# Patient Record
Sex: Male | Born: 1940
Health system: Southern US, Community
[De-identification: ages and names within clinical notes are randomized; demographics above are authoritative.]

## PROBLEM LIST (undated history)

## (undated) DIAGNOSIS — M1990 Unspecified osteoarthritis, unspecified site: Secondary | ICD-10-CM

## (undated) DIAGNOSIS — I4891 Unspecified atrial fibrillation: Secondary | ICD-10-CM

## (undated) DIAGNOSIS — I10 Essential (primary) hypertension: Secondary | ICD-10-CM

## (undated) DIAGNOSIS — R7309 Other abnormal glucose: Secondary | ICD-10-CM

## (undated) DIAGNOSIS — E781 Pure hyperglyceridemia: Secondary | ICD-10-CM

## (undated) DIAGNOSIS — S060X9A Concussion with loss of consciousness of unspecified duration, initial encounter: Secondary | ICD-10-CM

## (undated) DIAGNOSIS — H3560 Retinal hemorrhage, unspecified eye: Secondary | ICD-10-CM

## (undated) DIAGNOSIS — I2510 Atherosclerotic heart disease of native coronary artery without angina pectoris: Secondary | ICD-10-CM

## (undated) DIAGNOSIS — G4733 Obstructive sleep apnea (adult) (pediatric): Secondary | ICD-10-CM

## (undated) DIAGNOSIS — R42 Dizziness and giddiness: Secondary | ICD-10-CM

## (undated) DIAGNOSIS — S060XAA Concussion with loss of consciousness status unknown, initial encounter: Secondary | ICD-10-CM

## (undated) DIAGNOSIS — R4020 Unspecified coma: Secondary | ICD-10-CM

## (undated) DIAGNOSIS — E291 Testicular hypofunction: Secondary | ICD-10-CM

## (undated) HISTORY — DX: Concussion with loss of consciousness of unspecified duration, initial encounter: S06.0X9A

## (undated) HISTORY — DX: Other abnormal glucose: R73.09

## (undated) HISTORY — DX: Retinal hemorrhage, unspecified eye: H35.60

## (undated) HISTORY — DX: Atherosclerotic heart disease of native coronary artery without angina pectoris: I25.10

## (undated) HISTORY — DX: Testicular hypofunction: E29.1

## (undated) HISTORY — DX: Unspecified osteoarthritis, unspecified site: M19.90

## (undated) HISTORY — DX: Unspecified coma: R40.20

## (undated) HISTORY — PX: NASAL SINUS SURGERY: SHX719

## (undated) HISTORY — DX: Obstructive sleep apnea (adult) (pediatric): G47.33

## (undated) HISTORY — DX: Concussion with loss of consciousness status unknown, initial encounter: S06.0XAA

## (undated) HISTORY — DX: Essential (primary) hypertension: I10

## (undated) HISTORY — DX: Pure hyperglyceridemia: E78.1

## (undated) HISTORY — PX: FACIAL FRACTURE SURGERY: SHX1570

## (undated) HISTORY — DX: Dizziness and giddiness: R42

---

## 2001-04-09 ENCOUNTER — Encounter: Payer: Self-pay | Admitting: Internal Medicine

## 2003-05-16 ENCOUNTER — Ambulatory Visit (HOSPITAL_COMMUNITY): Admission: RE | Admit: 2003-05-16 | Discharge: 2003-05-16 | Payer: Self-pay | Admitting: Internal Medicine

## 2003-05-16 ENCOUNTER — Encounter: Payer: Self-pay | Admitting: Internal Medicine

## 2004-06-26 ENCOUNTER — Ambulatory Visit: Admission: RE | Admit: 2004-06-26 | Discharge: 2004-06-26 | Payer: Self-pay | Admitting: Otolaryngology

## 2005-04-11 ENCOUNTER — Ambulatory Visit: Payer: Self-pay | Admitting: Internal Medicine

## 2005-04-26 ENCOUNTER — Ambulatory Visit: Payer: Self-pay | Admitting: Internal Medicine

## 2005-05-19 ENCOUNTER — Encounter: Payer: Self-pay | Admitting: Internal Medicine

## 2005-10-03 ENCOUNTER — Encounter: Admission: RE | Admit: 2005-10-03 | Discharge: 2005-10-03 | Payer: Self-pay | Admitting: Internal Medicine

## 2005-10-03 ENCOUNTER — Ambulatory Visit: Payer: Self-pay | Admitting: Internal Medicine

## 2006-03-27 ENCOUNTER — Ambulatory Visit: Payer: Self-pay | Admitting: Internal Medicine

## 2006-03-27 ENCOUNTER — Encounter: Admission: RE | Admit: 2006-03-27 | Discharge: 2006-03-27 | Payer: Self-pay | Admitting: Internal Medicine

## 2006-05-17 ENCOUNTER — Ambulatory Visit: Payer: Self-pay | Admitting: Internal Medicine

## 2006-07-23 ENCOUNTER — Ambulatory Visit: Payer: Self-pay | Admitting: Internal Medicine

## 2006-10-23 ENCOUNTER — Ambulatory Visit: Payer: Self-pay | Admitting: Internal Medicine

## 2006-11-12 ENCOUNTER — Ambulatory Visit: Payer: Self-pay | Admitting: Internal Medicine

## 2006-12-19 ENCOUNTER — Ambulatory Visit: Payer: Self-pay | Admitting: Internal Medicine

## 2006-12-19 LAB — CONVERTED CEMR LAB
ALT: 23 units/L (ref 0–40)
AST: 19 units/L (ref 0–37)
BUN: 17 mg/dL (ref 6–23)
Basophils Absolute: 0 10*3/uL (ref 0.0–0.1)
Basophils Relative: 0.5 % (ref 0.0–1.0)
CO2: 29 meq/L (ref 19–32)
Calcium: 9.7 mg/dL (ref 8.4–10.5)
Chloride: 106 meq/L (ref 96–112)
Cholesterol: 180 mg/dL (ref 0–200)
Creatinine, Ser: 1 mg/dL (ref 0.4–1.5)
Eosinophils Absolute: 0.3 10*3/uL (ref 0.0–0.6)
Eosinophils Relative: 4.6 % (ref 0.0–5.0)
GFR calc Af Amer: 96 mL/min
GFR calc non Af Amer: 79 mL/min
Glucose, Bld: 106 mg/dL — ABNORMAL HIGH (ref 70–99)
HCT: 42.5 % (ref 39.0–52.0)
HDL: 40.7 mg/dL (ref >39.0)
Hemoglobin: 14.7 g/dL (ref 13.0–17.0)
LDL Cholesterol: 110 mg/dL — ABNORMAL HIGH (ref 0–99)
Lymphocytes Relative: 32.7 % (ref 12.0–46.0)
MCHC: 34.6 g/dL (ref 30.0–36.0)
MCV: 84.7 fL (ref 78.0–100.0)
Monocytes Absolute: 0.6 10*3/uL (ref 0.2–0.7)
Monocytes Relative: 10.3 % (ref 3.0–11.0)
Neutro Abs: 2.8 10*3/uL (ref 1.4–7.7)
Neutrophils Relative %: 51.9 % (ref 43.0–77.0)
PSA: 0.94 ng/mL (ref 0.10–4.00)
Platelets: 224 10*3/uL (ref 150–400)
Potassium: 4.1 meq/L (ref 3.5–5.1)
RBC: 5.02 M/uL (ref 4.22–5.81)
RDW: 12.2 % (ref 11.5–14.6)
Sodium: 141 meq/L (ref 135–145)
TSH: 1.62 microintl units/mL (ref 0.35–5.50)
Total CHOL/HDL Ratio: 4.4
Triglycerides: 146 mg/dL (ref 0–149)
VLDL: 29 mg/dL (ref 0–40)
WBC: 5.5 10*3/uL (ref 4.5–10.5)

## 2006-12-21 ENCOUNTER — Ambulatory Visit: Payer: Self-pay | Admitting: Internal Medicine

## 2007-03-26 ENCOUNTER — Ambulatory Visit: Payer: Self-pay | Admitting: Internal Medicine

## 2007-04-15 ENCOUNTER — Ambulatory Visit: Payer: Self-pay | Admitting: Internal Medicine

## 2007-08-06 ENCOUNTER — Telehealth: Payer: Self-pay | Admitting: Internal Medicine

## 2007-08-14 DIAGNOSIS — I10 Essential (primary) hypertension: Secondary | ICD-10-CM

## 2007-08-14 DIAGNOSIS — M1990 Unspecified osteoarthritis, unspecified site: Secondary | ICD-10-CM | POA: Insufficient documentation

## 2007-08-14 DIAGNOSIS — R42 Dizziness and giddiness: Secondary | ICD-10-CM

## 2007-08-14 HISTORY — DX: Unspecified osteoarthritis, unspecified site: M19.90

## 2007-08-14 HISTORY — DX: Essential (primary) hypertension: I10

## 2007-08-19 ENCOUNTER — Ambulatory Visit: Payer: Self-pay | Admitting: Internal Medicine

## 2007-08-19 DIAGNOSIS — R7309 Other abnormal glucose: Secondary | ICD-10-CM

## 2007-08-19 HISTORY — DX: Other abnormal glucose: R73.09

## 2007-10-07 ENCOUNTER — Telehealth: Payer: Self-pay | Admitting: Internal Medicine

## 2007-10-25 ENCOUNTER — Ambulatory Visit: Payer: Self-pay | Admitting: Internal Medicine

## 2007-10-25 LAB — CONVERTED CEMR LAB
Bilirubin Urine: NEGATIVE
Blood in Urine, dipstick: NEGATIVE
Glucose, Urine, Semiquant: NEGATIVE
Ketones, urine, test strip: NEGATIVE
Nitrite: NEGATIVE
Protein, U semiquant: NEGATIVE
Specific Gravity, Urine: 1.01
Urobilinogen, UA: 0.2
WBC Urine, dipstick: NEGATIVE
pH: 7

## 2007-10-27 LAB — CONVERTED CEMR LAB
BUN: 11 mg/dL (ref 6–23)
CO2: 29 meq/L (ref 19–32)
Calcium: 10.1 mg/dL (ref 8.4–10.5)
Chloride: 104 meq/L (ref 96–112)
Creatinine, Ser: 1 mg/dL (ref 0.4–1.5)
GFR calc Af Amer: 96 mL/min
GFR calc non Af Amer: 79 mL/min
Glucose, Bld: 108 mg/dL — ABNORMAL HIGH (ref 70–99)
Hgb A1c MFr Bld: 5.7 % (ref 4.6–6.0)
Potassium: 5 meq/L (ref 3.5–5.1)
Sodium: 141 meq/L (ref 135–145)

## 2007-11-04 ENCOUNTER — Encounter: Payer: Self-pay | Admitting: Internal Medicine

## 2007-11-07 ENCOUNTER — Ambulatory Visit: Payer: Self-pay | Admitting: Internal Medicine

## 2007-11-07 DIAGNOSIS — J069 Acute upper respiratory infection, unspecified: Secondary | ICD-10-CM | POA: Insufficient documentation

## 2007-11-12 ENCOUNTER — Ambulatory Visit: Payer: Self-pay | Admitting: Internal Medicine

## 2007-11-21 ENCOUNTER — Ambulatory Visit: Payer: Self-pay | Admitting: Internal Medicine

## 2007-11-21 DIAGNOSIS — M549 Dorsalgia, unspecified: Secondary | ICD-10-CM | POA: Insufficient documentation

## 2007-11-21 LAB — CONVERTED CEMR LAB
Bilirubin Urine: NEGATIVE
Blood in Urine, dipstick: NEGATIVE
Glucose, Urine, Semiquant: NEGATIVE
Ketones, urine, test strip: NEGATIVE
Nitrite: NEGATIVE
Protein, U semiquant: NEGATIVE
Specific Gravity, Urine: 1.01
Urobilinogen, UA: 0.2
WBC Urine, dipstick: NEGATIVE
pH: 7

## 2008-01-10 ENCOUNTER — Emergency Department (HOSPITAL_COMMUNITY): Admission: EM | Admit: 2008-01-10 | Discharge: 2008-01-11 | Payer: Self-pay | Admitting: Emergency Medicine

## 2008-01-23 ENCOUNTER — Ambulatory Visit (HOSPITAL_COMMUNITY): Admission: RE | Admit: 2008-01-23 | Discharge: 2008-01-23 | Payer: Self-pay | Admitting: Internal Medicine

## 2008-11-16 ENCOUNTER — Ambulatory Visit: Payer: Self-pay | Admitting: Internal Medicine

## 2008-11-16 DIAGNOSIS — G4733 Obstructive sleep apnea (adult) (pediatric): Secondary | ICD-10-CM

## 2008-11-16 HISTORY — DX: Obstructive sleep apnea (adult) (pediatric): G47.33

## 2008-11-17 ENCOUNTER — Encounter: Payer: Self-pay | Admitting: Internal Medicine

## 2008-11-30 LAB — CONVERTED CEMR LAB
Calcium, Total (PTH): 10.6 mg/dL — ABNORMAL HIGH (ref 8.4–10.5)
PTH: 87.2 pg/mL — ABNORMAL HIGH (ref 14.0–72.0)

## 2008-12-04 ENCOUNTER — Ambulatory Visit: Payer: Self-pay | Admitting: Internal Medicine

## 2008-12-14 ENCOUNTER — Ambulatory Visit: Payer: Self-pay | Admitting: Internal Medicine

## 2008-12-14 DIAGNOSIS — E781 Pure hyperglyceridemia: Secondary | ICD-10-CM | POA: Insufficient documentation

## 2008-12-14 HISTORY — DX: Pure hyperglyceridemia: E78.1

## 2009-01-08 ENCOUNTER — Telehealth: Payer: Self-pay | Admitting: Internal Medicine

## 2009-01-11 ENCOUNTER — Telehealth: Payer: Self-pay | Admitting: Internal Medicine

## 2009-02-12 ENCOUNTER — Ambulatory Visit: Payer: Self-pay | Admitting: Internal Medicine

## 2009-02-12 DIAGNOSIS — J029 Acute pharyngitis, unspecified: Secondary | ICD-10-CM | POA: Insufficient documentation

## 2009-02-12 DIAGNOSIS — J310 Chronic rhinitis: Secondary | ICD-10-CM | POA: Insufficient documentation

## 2009-02-12 DIAGNOSIS — D2330 Other benign neoplasm of skin of unspecified part of face: Secondary | ICD-10-CM

## 2009-02-12 DIAGNOSIS — T50995A Adverse effect of other drugs, medicaments and biological substances, initial encounter: Secondary | ICD-10-CM | POA: Insufficient documentation

## 2009-02-12 LAB — CONVERTED CEMR LAB: Rapid Strep: NEGATIVE

## 2009-07-27 ENCOUNTER — Ambulatory Visit: Payer: Self-pay | Admitting: Internal Medicine

## 2009-07-27 DIAGNOSIS — R5383 Other fatigue: Secondary | ICD-10-CM

## 2009-07-27 DIAGNOSIS — R5381 Other malaise: Secondary | ICD-10-CM | POA: Insufficient documentation

## 2009-08-10 ENCOUNTER — Ambulatory Visit: Payer: Self-pay | Admitting: Internal Medicine

## 2009-08-10 LAB — CONVERTED CEMR LAB
ALT: 21 units/L (ref 0–53)
AST: 22 units/L (ref 0–37)
Albumin: 4.1 g/dL (ref 3.5–5.2)
Alkaline Phosphatase: 57 units/L (ref 39–117)
BUN: 14 mg/dL (ref 6–23)
Basophils Absolute: 0 10*3/uL (ref 0.0–0.1)
Basophils Relative: 0.4 % (ref 0.0–3.0)
Bilirubin, Direct: 0.1 mg/dL (ref 0.0–0.3)
CO2: 30 meq/L (ref 19–32)
Calcium: 9.7 mg/dL (ref 8.4–10.5)
Chloride: 105 meq/L (ref 96–112)
Cholesterol: 184 mg/dL (ref 0–200)
Creatinine, Ser: 1 mg/dL (ref 0.4–1.5)
Direct LDL: 106.5 mg/dL
Eosinophils Absolute: 0.1 10*3/uL (ref 0.0–0.7)
Eosinophils Relative: 2.8 % (ref 0.0–5.0)
GFR calc non Af Amer: 78.71 mL/min (ref >60)
Glucose, Bld: 100 mg/dL — ABNORMAL HIGH (ref 70–99)
HCT: 42.8 % (ref 39.0–52.0)
HDL: 40.4 mg/dL (ref >39.00)
Hemoglobin: 14.7 g/dL (ref 13.0–17.0)
Hgb A1c MFr Bld: 5.5 % (ref 4.6–6.5)
LH: 8.13 milliintl units/mL (ref 1.50–9.30)
Lymphocytes Relative: 31.8 % (ref 12.0–46.0)
Lymphs Abs: 1.6 10*3/uL (ref 0.7–4.0)
MCHC: 34.3 g/dL (ref 30.0–36.0)
MCV: 88.2 fL (ref 78.0–100.0)
Monocytes Absolute: 0.5 10*3/uL (ref 0.1–1.0)
Monocytes Relative: 10.8 % (ref 3.0–12.0)
Neutro Abs: 2.8 10*3/uL (ref 1.4–7.7)
Neutrophils Relative %: 54.2 % (ref 43.0–77.0)
Platelets: 203 10*3/uL (ref 150.0–400.0)
Potassium: 4.4 meq/L (ref 3.5–5.1)
RBC: 4.86 M/uL (ref 4.22–5.81)
RDW: 12.3 % (ref 11.5–14.6)
Sodium: 141 meq/L (ref 135–145)
Testosterone: 279.13 ng/dL — ABNORMAL LOW (ref 350.00–890.00)
Total Bilirubin: 0.9 mg/dL (ref 0.3–1.2)
Total CHOL/HDL Ratio: 5
Total Protein: 7 g/dL (ref 6.0–8.3)
Triglycerides: 213 mg/dL — ABNORMAL HIGH (ref 0.0–149.0)
VLDL: 42.6 mg/dL — ABNORMAL HIGH (ref 0.0–40.0)
WBC: 5 10*3/uL (ref 4.5–10.5)

## 2009-08-17 ENCOUNTER — Ambulatory Visit: Payer: Self-pay | Admitting: Internal Medicine

## 2009-11-30 ENCOUNTER — Ambulatory Visit: Payer: Self-pay | Admitting: Internal Medicine

## 2010-01-27 ENCOUNTER — Ambulatory Visit: Payer: Self-pay | Admitting: Internal Medicine

## 2010-01-27 LAB — CONVERTED CEMR LAB
ALT: 27 U/L (ref 0–53)
AST: 22 U/L (ref 0–37)
Albumin: 4 g/dL (ref 3.5–5.2)
Alkaline Phosphatase: 52 U/L (ref 39–117)
BUN: 11 mg/dL (ref 6–23)
Basophils Absolute: 0 K/uL (ref 0.0–0.1)
Basophils Relative: 0.8 % (ref 0.0–3.0)
Bilirubin, Direct: 0 mg/dL (ref 0.0–0.3)
CO2: 29 meq/L (ref 19–32)
Calcium: 10.1 mg/dL (ref 8.4–10.5)
Chloride: 110 meq/L (ref 96–112)
Cholesterol: 161 mg/dL (ref 0–200)
Creatinine, Ser: 1 mg/dL (ref 0.4–1.5)
Eosinophils Absolute: 0.2 K/uL (ref 0.0–0.7)
Eosinophils Relative: 4.7 % (ref 0.0–5.0)
GFR calc non Af Amer: 78.6 mL/min (ref >60)
Glucose, Bld: 107 mg/dL — ABNORMAL HIGH (ref 70–99)
HCT: 42 % (ref 39.0–52.0)
HDL: 43.3 mg/dL (ref >39.00)
Hemoglobin: 14.5 g/dL (ref 13.0–17.0)
LDL Cholesterol: 84 mg/dL (ref 0–99)
Lymphocytes Relative: 36.3 % (ref 12.0–46.0)
Lymphs Abs: 1.7 K/uL (ref 0.7–4.0)
MCHC: 34.6 g/dL (ref 30.0–36.0)
MCV: 87.2 fL (ref 78.0–100.0)
Monocytes Absolute: 0.4 K/uL (ref 0.1–1.0)
Monocytes Relative: 8.7 % (ref 3.0–12.0)
Neutro Abs: 2.4 K/uL (ref 1.4–7.7)
Neutrophils Relative %: 49.5 % (ref 43.0–77.0)
Platelets: 197 K/uL (ref 150.0–400.0)
Potassium: 4.8 meq/L (ref 3.5–5.1)
RBC: 4.82 M/uL (ref 4.22–5.81)
RDW: 13.6 % (ref 11.5–14.6)
Sodium: 147 meq/L — ABNORMAL HIGH (ref 135–145)
TSH: 2.37 u[IU]/mL (ref 0.35–5.50)
Total Bilirubin: 0.3 mg/dL (ref 0.3–1.2)
Total CHOL/HDL Ratio: 4
Total Protein: 7 g/dL (ref 6.0–8.3)
Triglycerides: 168 mg/dL — ABNORMAL HIGH (ref 0.0–149.0)
VLDL: 33.6 mg/dL (ref 0.0–40.0)
WBC: 4.8 10*3/microliter (ref 4.5–10.5)

## 2010-02-03 ENCOUNTER — Ambulatory Visit: Payer: Self-pay | Admitting: Internal Medicine

## 2010-09-27 ENCOUNTER — Telehealth: Payer: Self-pay | Admitting: Internal Medicine

## 2010-11-13 LAB — CONVERTED CEMR LAB
BUN: 15 mg/dL (ref 6–23)
CO2: 26 meq/L (ref 19–32)
Calcium, Total (PTH): 10 mg/dL (ref 8.4–10.5)
Calcium: 10.1 mg/dL (ref 8.4–10.5)
Chloride: 107 meq/L (ref 96–112)
Cholesterol: 186 mg/dL (ref 0–200)
Creatinine, Ser: 0.9 mg/dL (ref 0.4–1.5)
Direct LDL: 107.5 mg/dL
GFR calc Af Amer: 108 mL/min
GFR calc non Af Amer: 89 mL/min
Glucose, Bld: 93 mg/dL (ref 70–99)
HDL: 37 mg/dL — ABNORMAL LOW (ref >39.0)
PTH: 71.8 pg/mL (ref 14.0–72.0)
Potassium: 3.5 meq/L (ref 3.5–5.1)
Sodium: 141 meq/L (ref 135–145)
TSH: 2.66 microintl units/mL (ref 0.35–5.50)
Total CHOL/HDL Ratio: 5
Triglycerides: 292 mg/dL (ref 0–149)
VLDL: 58 mg/dL — ABNORMAL HIGH (ref 0–40)
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
## 2010-11-17 NOTE — Progress Notes (Signed)
Summary: rx for denivar  Phone Note Call from Patient Call back at Home Phone [phone]   Caller: Patient Summary of Call: Pt woke up with a cold sore this am. Pt states that his dentist called in Deniver. Pt would like this called [facility].  Initial call taken by: Romualdo Bolk, CMA (AAMA),  September 27, 2010 1:22 PM  Follow-up for Phone Call        Per Dr. Fabian Sharp- Okay to call in Denavir. Pt aware. Follow-up by: Romualdo Bolk, CMA (AAMA),  September 27, 2010 1:23 PM    New/Updated Medications: DENAVIR 1 % CREA (PENCICLOVIR) apply every 2 hours while awake for 4 days Prescriptions: DENAVIR 1 % CREA (PENCICLOVIR) apply every 2 hours while awake for 4 days  #1 x 0   Entered by:   Romualdo Bolk, CMA (AAMA)   Authorized by:   [name] MD   Signed by:   Romualdo Bolk, CMA (AAMA) on 09/27/2010   Method used:   Electronically to        [facility]. [address] [phone]* (retail)       [address] [place]  [address]       Ph: [phone]       Fax: [phone]   RxID:   [id number]

## 2010-12-14 ENCOUNTER — Encounter: Payer: Self-pay | Admitting: Internal Medicine

## 2010-12-14 ENCOUNTER — Ambulatory Visit (INDEPENDENT_AMBULATORY_CARE_PROVIDER_SITE_OTHER): Payer: Medicare Other | Admitting: Internal Medicine

## 2010-12-14 DIAGNOSIS — R109 Unspecified abdominal pain: Secondary | ICD-10-CM | POA: Insufficient documentation

## 2010-12-14 DIAGNOSIS — R1031 Right lower quadrant pain: Secondary | ICD-10-CM | POA: Insufficient documentation

## 2010-12-14 LAB — POCT URINALYSIS DIPSTICK
Ketones, UA: NEGATIVE
Leukocytes, UA: NEGATIVE
Nitrite, UA: NEGATIVE
Urobilinogen, UA: 0.2
pH, UA: 7

## 2010-12-14 NOTE — Patient Instructions (Signed)
If pain returns call and should be Sodium-Controlled Diet Sodium is a mineral. It is found in many foods. Sodium may be found naturally or added during the making of a food. The most common form of sodium is salt, which is made up of sodium and chloride. Reducing your sodium intake involves changing your eating habits. The following guidelines will help you reduce the sodium in your diet:  Stop using the salt shaker.   Use salt sparingly in cooking and baking.   Substitute with sodium-free seasonings and spices.   Do not use a salt substitute (potassium chloride) without your caregiver's permission.   Include a variety of fresh, unprocessed foods in your diet.   Limit the use of processed and convenience foods that are high in sodium.  USE THE FOLLOWING FOODS SPARINGLY: Breads/Starches  Commercial bread stuffing, commercial pancake or waffle mixes, coating mixes. Waffles. Croutons. Prepared (boxed or frozen) potato, rice, or noodle mixes that contain salt or sodium. Salted [place] fries or hash browns. Salted popcorn, breads, crackers, chips, or snack foods.  Vegetables  Vegetables canned with salt or prepared in cream, butter, or cheese sauces. Sauerkraut. Tomato or vegetable juices canned with salt.   Fresh vegetables are allowed if rinsed thoroughly.  Fruit  Fruit is okay to eat.  Meat and Meat Substitutes  Salted or smoked meats, such as bacon or Canadian bacon, chipped or corned beef, hot dogs, salt pork, luncheon meats, pastrami, ham, or sausage. Canned or smoked fish, poultry, or meat. Processed cheese or cheese spreads, blue or Roquefort cheese. Battered or frozen fish products. Prepared spaghetti sauce. Baked beans. Reuben sandwiches. Salted nuts. Caviar.  Milk  Limit buttermilk to 1 cup per week.  Soups and Combination Foods  Bouillon cubes, canned or dried soups, broth, consomm. Convenience (frozen or packaged) dinners with more than 600 milligrams (mg) sodium. Pot pies,  pizza, Asian food, fast food cheeseburgers, and specialty sandwiches.  Desserts and Sweets  Regular (salted) desserts, pie, commercial fruit snack pies, commercial snack cakes, canned puddings.   Eat desserts and sweets in moderation.  Fats and Oils  Gravy mixes or canned gravy. No more than 1 to 2 tablespoons of salad dressing. Chip dips.   Eat fats and oils in moderation.  Beverages  See those listed under the vegetables and milk groups.  Condiments/Miscellaneous  Ketchup, mustard, meat sauces, salsa, regular (salted) and lite soy sauce or mustard. Dill pickles, olives, meat tenderizer. Prepared horseradish or pickle relish. Dutch-processed cocoa. Baking powder or baking soda used medicinally. Worcestershire sauce. "Light" salt. Salt substitute, unless approved by your caregiver.  Document Released: 03/24/2002 Document Re-Released: 12/27/2009 [facility] Patient Information 2011 [place], [place]. evaluated for possible renal stone  Or other cause.    Losing weight and decreasing sodium may help the recent increase in your blood pressure. 2 Gram Low Sodium Diet A 2 gram sodium diet restricts the amount of sodium in the diet to no more than 2 grams (g) or 2000 milligrams (mg) daily. Limiting the amount of sodium is often used to help lower blood pressure. It is important if you have heart, liver, or kidney problems. Many foods contain sodium for flavor and sometimes as a preservative. When the amount of sodium in a diet needs to be low, it is important to know what to look for when choosing foods and drinks. The following includes some information and guidelines to help make it easier for you to adapt to a low sodium diet. QUICK TIPS  Do not  add salt to food.   Avoid convenience items and fast food.   Choose unsalted snack foods.   Buy lower sodium products, often labeled as "lower sodium" or "no salt added."   Check food labels to learn how much sodium is in 1 serving.   When eating at  a restaurant, ask that your food be prepared with less salt or none, if possible.  READING FOOD LABELS FOR SODIUM INFORMATION The nutrition facts label is a good place to find how much sodium is in foods. Look for products with no more than 500 to 600 mg of sodium per meal and no more than 150 mg per serving. Remember that 2 g = 2000 mg. The food label may also list foods as:  Sodium-free: Less than 5 mg in a serving.   Very low sodium: 35 mg or less in a serving.   Low-sodium: 140 mg or less in a serving.   Light in sodium: 50% less sodium in a serving. For example, if a food that usually has 300 mg of sodium is changed to become light in sodium, it will have 150 mg of sodium.   Reduced sodium: 25% less sodium in a serving. For example, if a food that usually has 400 mg of sodium is changed to reduced sodium, it will have 300 mg of sodium.  CHOOSING FOODS AVOID CHOOSE  Grains: Salted crackers and snack items. Some cereals, including instant hot cereals. Bread stuffing and biscuit mixes. Seasoned rice or pasta mixes. Grains: Unsalted snack items. Low-sodium cereals, oats, puffed wheat and rice, shredded wheat. English muffins and bread. Pasta.  Meats: Salted, canned, smoked, spiced, or pickled meats, including fish and poultry. Bacon, ham, sausage, cold cuts, hot dogs, anchovies. Meats: Low-sodium canned tuna and salmon. Fresh or frozen meat, poultry, and fish.  Dairy: Processed cheese and spreads. Cottage cheese. Buttermilk and condensed milk. Regular cheese. Dairy: Milk. Low-sodium cottage cheese. Yogurt. Sour cream. Low-sodium cheese.  Fruits and Vegetables: Regular canned vegetables. Regular canned tomato sauce and paste. Frozen vegetables in sauces. Olives. Rosita Fire. Relishes. Sauerkraut. Fruits and Vegetables: Low-sodium canned vegetables. Low-sodium tomato sauce and paste. Fresh and frozen vegetables. Fresh and frozen fruit.  Condiments: Canned and packaged  gravies. Worcestershire sauce. Tartar sauce. Barbecue sauce. Soy sauce. Steak sauce. Ketchup. Onion, garlic, and table salt. Meat flavorings and tenderizers. Condiments: Fresh and dried herbs and spices. Low-sodium varieties of mustard and ketchup. Lemon juice. Tabasco sauce. Horseradish.  SAMPLE 2 GRAM SODIUM MEAL PLAN Meal Foods Sodium (mg)  Breakfast 1 cup low-fat milk 2 slices whole-wheat toast 1 tablespoon heart-healthy margarine 1 hard-boiled egg 1 small orange [phone]  Lunch 1 cup raw carrots  cup hummus 1 cup low-fat milk  cup red grapes 1 whole-wheat pita bread [phone]  Dinner 1 cup whole-wheat pasta 1 cup low-sodium tomato sauce 3 ounces lean ground beef 1 small side salad (1 cup raw spinach leaves,  cup cucumber,  cup yellow bell pepper) with 1 teaspoon olive oil and 1 teaspoon red wine vinegar 2 73 57 25  Snack 1 container low-fat vanilla yogurt 3 graham cracker squares 107 127  Nutrient Analysis Calories: 2033 Protein: 77 g Carbohydrate: 282 g Fat: 72 g Sodium: 1971 mg Document Released: 10/02/2005 Document Re-Released: 03/22/2010 [facility] Patient Information 2011 [place], [place].

## 2010-12-14 NOTE — Progress Notes (Signed)
  Subjective:    Patient ID: Bryan Cabrera, male    DOB: 05-03-1941, 70 y.o.   MRN: [id number]  HPI  patient comes in today for an acute visit for the above problem. He was in his usual state of health until about 4 days ago when he had the crescendo onset of right groin pain that radiated up into his right flank. He states the pain was bad enough that he thought about going to the emergency room and had difficulty being comfortable that evening. He took 2 aspirin no other major treatment. By 48 hours later his pain had subsided and today it  Has resolvedr. He had no dysuria but had some increased frequency and comes in to make sure he doesn't have a urine infection. He denies having this pain before.  blood pressure had been controlled since after Christmas he gained some weight and is now running sometimes in the 140s. No change in medications.  Wt Readings from Last 3 Encounters:  12/14/10 213 lb (96.616 kg)  02/03/10 207 lb (93.895 kg)  11/30/09 209 lb (94.802 kg)    reviewed blood pressure readings log with patient.   Past Medical History  Diagnosis Date  . HYPERTRIGLYCERIDEMIA 12/14/2008  . OBSTRUCTIVE SLEEP APNEA 11/16/2008  . HYPERTENSION 08/14/2007  . OSTEOARTHRITIS 08/14/2007  . HYPERGLYCEMIA, BORDERLINE 08/19/2007  . Other testicular hypofunction    History reviewed. No pertinent past surgical history.  reports that he has never smoked. He does not have any smokeless tobacco history on file. He reports that he drinks alcohol. He reports that he does not use illicit drugs. family history includes Diabetes in his brother and Stroke in his mother.     Review of Systems  no hematuria history of kidney stones nausea vomiting diarrhea. Had some recent back pain but felt it was musculoskeletal getting better.   Rest of ROS negative or as per history of present illness    Objective:   Physical Exam BP  148/82  right arm sitting    well-developed well-nourished in no acute distress  neck supple without adenopathy  chest CTA BS equal  cardiac S1-S2 no gallops or murmurs. No clubbing cyanosis or edema   Abdomen   Soft without masses fluid wave or hepatosplenomegaly. No CVA tenderness slight tenderness in the right lateral area no inguinal masses or hernias. No scrotal swelling. Gait is within normal limits. Neuro  Grossly nl   UA within normal limits.       Assessment & Plan:   right inguinal abdominal pain   Almost completely resolved of unclear etiology.  Discussed possibility of renal stone as the cause. Discussed further imaging and evaluation. Since he is well today we will observe carefully and if his symptoms recur reevaluate consider CT scan. There is no evidence of appendicitis today   hypertension:.  Somewhat elevated today this is probably associated with his weight gain over the last 3 months and lifestyle changes. Counseled about lower sodium weight loss another activity. He can call if needed but he is due for a check up with labs in April this year. He should schedule for this before he leaves  weight gain   Appears most likely from lifestyle changes decreased activity holidays etc. counseled

## 2010-12-30 ENCOUNTER — Other Ambulatory Visit: Payer: Self-pay | Admitting: Internal Medicine

## 2011-02-17 ENCOUNTER — Encounter: Payer: Self-pay | Admitting: Internal Medicine

## 2011-02-17 ENCOUNTER — Ambulatory Visit (INDEPENDENT_AMBULATORY_CARE_PROVIDER_SITE_OTHER): Payer: Medicare Other | Admitting: Internal Medicine

## 2011-02-17 ENCOUNTER — Ambulatory Visit: Payer: Medicare Other | Admitting: Internal Medicine

## 2011-02-17 VITALS — BP 110/70 | HR 66 | Ht 68.0 in | Wt 209.0 lb

## 2011-02-17 DIAGNOSIS — H269 Unspecified cataract: Secondary | ICD-10-CM | POA: Insufficient documentation

## 2011-02-17 DIAGNOSIS — N529 Male erectile dysfunction, unspecified: Secondary | ICD-10-CM | POA: Insufficient documentation

## 2011-02-17 DIAGNOSIS — K644 Residual hemorrhoidal skin tags: Secondary | ICD-10-CM | POA: Insufficient documentation

## 2011-02-17 DIAGNOSIS — E781 Pure hyperglyceridemia: Secondary | ICD-10-CM

## 2011-02-17 DIAGNOSIS — N138 Other obstructive and reflux uropathy: Secondary | ICD-10-CM

## 2011-02-17 DIAGNOSIS — R5381 Other malaise: Secondary | ICD-10-CM

## 2011-02-17 DIAGNOSIS — Z0000 Encounter for general adult medical examination without abnormal findings: Secondary | ICD-10-CM

## 2011-02-17 DIAGNOSIS — B001 Herpesviral vesicular dermatitis: Secondary | ICD-10-CM | POA: Insufficient documentation

## 2011-02-17 DIAGNOSIS — R7309 Other abnormal glucose: Secondary | ICD-10-CM

## 2011-02-17 DIAGNOSIS — I10 Essential (primary) hypertension: Secondary | ICD-10-CM

## 2011-02-17 DIAGNOSIS — G4733 Obstructive sleep apnea (adult) (pediatric): Secondary | ICD-10-CM

## 2011-02-17 DIAGNOSIS — H9190 Unspecified hearing loss, unspecified ear: Secondary | ICD-10-CM | POA: Insufficient documentation

## 2011-02-17 DIAGNOSIS — Z136 Encounter for screening for cardiovascular disorders: Secondary | ICD-10-CM

## 2011-02-17 DIAGNOSIS — M1990 Unspecified osteoarthritis, unspecified site: Secondary | ICD-10-CM

## 2011-02-17 LAB — HEPATIC FUNCTION PANEL
ALT: 24 U/L (ref 0–53)
AST: 22 U/L (ref 0–37)
Albumin: 4 g/dL (ref 3.5–5.2)
Alkaline Phosphatase: 48 U/L (ref 39–117)
Bilirubin, Direct: 0.1 mg/dL (ref 0.0–0.3)
Total Bilirubin: 0.8 mg/dL (ref 0.3–1.2)
Total Protein: 7 g/dL (ref 6.0–8.3)

## 2011-02-17 LAB — BASIC METABOLIC PANEL
BUN: 18 mg/dL (ref 6–23)
CO2: 28 mEq/L (ref 19–32)
Calcium: 9.8 mg/dL (ref 8.4–10.5)
Chloride: 106 mEq/L (ref 96–112)
Creatinine, Ser: 1 mg/dL (ref 0.4–1.5)
GFR: 83.14 mL/min (ref >60.00)
Glucose, Bld: 96 mg/dL (ref 70–99)
Potassium: 4.9 mEq/L (ref 3.5–5.1)
Sodium: 143 mEq/L (ref 135–145)

## 2011-02-17 LAB — TSH: TSH: 1.95 u[IU]/mL (ref 0.35–5.50)

## 2011-02-17 LAB — CBC WITH DIFFERENTIAL/PLATELET
Basophils Absolute: 0 10*3/uL (ref 0.0–0.1)
Basophils Relative: 0.5 % (ref 0.0–3.0)
Eosinophils Absolute: 0.2 10*3/uL (ref 0.0–0.7)
Eosinophils Relative: 3.6 % (ref 0.0–5.0)
Hemoglobin: 14.8 g/dL (ref 13.0–17.0)
Lymphocytes Relative: 29.9 % (ref 12.0–46.0)
MCHC: 35.2 g/dL (ref 30.0–36.0)
MCV: 86.6 fl (ref 78.0–100.0)
Monocytes Absolute: 0.5 10*3/uL (ref 0.1–1.0)
Neutro Abs: 3.5 10*3/uL (ref 1.4–7.7)
Neutrophils Relative %: 57.2 % (ref 43.0–77.0)
Platelets: 191 10*3/uL (ref 150.0–400.0)
RBC: 4.86 Mil/uL (ref 4.22–5.81)
RDW: 13.1 % (ref 11.5–14.6)
WBC: 6.1 10*3/uL (ref 4.5–10.5)

## 2011-02-17 LAB — TESTOSTERONE: Testosterone: 247.84 ng/dL — ABNORMAL LOW (ref 350.00–890.00)

## 2011-02-17 LAB — LIPID PANEL
Cholesterol: 195 mg/dL (ref 0–200)
HDL: 41.5 mg/dL (ref >39.00)
Total CHOL/HDL Ratio: 5
Triglycerides: 234 mg/dL — ABNORMAL HIGH (ref 0.0–149.0)
VLDL: 46.8 mg/dL — ABNORMAL HIGH (ref 0.0–40.0)

## 2011-02-17 LAB — PSA: PSA: 0.92 ng/mL (ref 0.10–4.00)

## 2011-02-17 MED ORDER — HYDROCORTISONE ACETATE 25 MG RE SUPP: 25.0000 mg | Freq: Two times a day (BID) | RECTAL | Status: DC

## 2011-02-17 NOTE — Progress Notes (Addendum)
Subjective:    Bryan Cabrera is a 70 y.o. male who presents for a  Medicare wellness visit and multiple medical problem review and exam.  Since his last visit he has done fairly well his blood pressure readings are usually in the 130/140 range. Denies side effects of medicines.  Cardiac risk factors: advanced age (older than 48 for men, 65 for women), hypertension and male gender. BP readings in 130/ 140 range    No se of med.     See ROS for a number of concerns.  Depression Screen (Note: if answer to either of the following is "Yes", a more complete depression screening is indicated)  Q1: Over the past two weeks, have you felt down, depressed or hopeless? no Q2: Over the past two weeks, have you felt little interest or pleasure in doing things? no  Activities of Daily Living In your present state of health, do you have any difficulty performing the following activities?:  Preparing food and eating?: No Bathing yourself: No Getting dressed: No Using the toilet:No Moving around from place to place: No In the past year have you fallen or had a near fall?:No  Current exercise habits: The patient does not participate in regular exercise at present.  Active in job Dietary issues discussed: cutting down sugars   Hearing difficulties: No Safe in current home environment: yes  Hearing: Some decrease left ear.   Vision:  To get cataract to be removed Epes  Right    Safety: No falls .  Has smoke detector and wears seat belts.  No firearms. No excess sun exposure. Sees dentist regularly   Advance directive :  Reviewed   Memory: others think it is good. No concerns  .  Has smoke detector and wears seat belts.   No excess sun exposure uses sun screen in summer  . Sees dentist regularly . No depression  The following portions of the patient's history were reviewed and updated as appropriate: allergies, current medications, past family history, past medical history, past social history,  past surgical history and problem list. Review of Systems As per HPI and  ocass heart burn  .   Reflux   . OSA   Not treatment mouth appliance caused jaw  Pain.   Right chest pain when turns   Sharp    Hemorrhoids   Nupercainal. ocss  Blood. At times when wipes  Going on for a month. Ha s some ed sx  Some fatigue hx of low testosterone in past .  Some weight gain. Occasional nocturia and frequency rest of ROS negative   Objective:     Vision pr  Eye doc  To get cataract surgery   Right  This month. Blood pressure 110/70, pulse 66, height 5\' 8"  (1.727 m), weight 209 lb (94.802 kg). Body mass index is 31.78 kg/(m^2). BP 110/70  Pulse 66  Ht 5\' 8"  (1.727 m)  Wt 209 lb (94.802 kg)  BMI 31.78 kg/m2  General Appearance:    Alert, cooperative, no distress, appears stated age  Head:    Normocephalic, without obvious abnormality, atraumatic  Eyes:    PERRL, conjunctiva/corneas clear, EOM's intact, , both eyes     glasses  Ears:    Normal TM's and external ear canals, both ears see hearing sceen  Nose:   Nares normal, septum midline, mucosa normal, no drainage    or sinus tenderness  Throat:   Lips, mucosa, and tongue normal; teeth and gums normal  Neck:  Supple, symmetrical, trachea midline, no adenopathy;       thyroid:  No enlargement/tenderness/nodules; no carotid   bruit or JVD  Back:     Symmetric, no curvature, ROM normal, no CVA tenderness  Lungs:     Clear to auscultation bilaterally, respirations unlabored  Chest wall:    Slight tenderness at right lower cc junction  no deformity  Heart:    Regular rate and rhythm, S1 and S2 normal, no murmur, rub   or gallop .   No clubbing cyanosis or edema  Abdomen:     Soft, non-tender, bowel sounds active all four quadrants,    no masses, no organomegaly  Genitalia:    Normal male   Rectal:    Normal tone, 1+ prostate, no masses or tenderness;   henorrhoids tags no blood   Extremities:   Extremities normal, atraumatic, no cyanosis or edema    Pulses:   2+ and symmetric all extremities  Skin:   Skin color, texture, turgor normal, no rashes or lesions sun changes   Lymph nodes:   Cervical, supraclavicular, and axillary nodes normal  Neurologic:   CN3-XII intact. Normal strength, sensation and reflexes      Throughout  See hearing screen  Some high frequency loss left ear   Mild to mod  More than left     EKG shows normal sinus rhythm. Occasional PAC   Assessment:  Medicare wellness  Declined the pneumovax information given on Zostavax HT  repeat blood pressure was in the 145 150 range needs to monitor due for labs OSA  untreated recommend weight loss declined further intervention LIPIDS; check today LOW T  him of the symptoms of such but sleep apnea age and overweight may alter intervention. ED consider medication. Increase exercise lose weight Obesity aware of need to decrease weight to help other health issues. Hemorrhoids; no alarm features but does have some bleeding at times will try prescription medication Anusol-HC suppositories. Slight decreased high frequency hearing left more than right discussed this should recheck it in a year or if getting worse see a audiologist. Chest wall pain and significant followup if persistent or progressive Cataract  To be removed      Plan:  See above plan   During the course of the visit the patient was educated and counseled about appropriate screening and preventive services including:   Pneumococcal vaccine   Screening electrocardiogram  Prostate cancer screening  Nutrition counseling  Counseled. About exercise and weight  Patient Instructions (the written plan) was given to the patient.   Medicare Attestation I have personally reviewed: The patient's medical and social history Their use of alcohol, tobacco or illicit drugs Their current medications and supplements The patient's functional ability including ADLs,fall risks, home safety risks, cognitive, and hearing and  visual impairment Diet and physical activities Evidence for depression or mood disorders  The patient's weight, height, BMI, and visual acuity have been recorded in the chart.  I have made referrals, counseling, and provided education to the patient based on review of the above and I have provided the patient with a written personalized care plan for preventive services.    Addendum :  This patient had normal cognition during exam  Was  Oriented x 3 and no noted deficits in memory, attention, and speech.   Please note in history he reported excellent memory  According to his age and he is still working full time and doing well at his job.

## 2011-02-17 NOTE — Patient Instructions (Signed)
You do have some decreased hearing at high frequency left more than right it is moderate. Can try the new hemorrhoid preparation for up to 2 weeks to see if that calms it down. We'll notify you of the lab results when they are available. Testosterone replacement can aggravate heart disease and sleep apnea. If your testosterone level is significantly low you may want to come back in and discussed this consider an ED drug.  Continue to monitor your blood pressure. Losing weight will help most of your medical issues.

## 2011-02-17 NOTE — Assessment & Plan Note (Signed)
To be removed right this month

## 2011-02-17 NOTE — Assessment & Plan Note (Signed)
History of lower testosterone has hypertension and sleep apnea. We'll check testosterone level again. Risk of replacement discussed.  Consider if low versus ED drugs. Weight loss will help all

## 2011-02-20 NOTE — Progress Notes (Signed)
Pt's wife aware of results

## 2011-03-03 NOTE — Procedures (Signed)
Bryan Cabrera, Bryan Cabrera               ACCOUNT NO.:  [id number]   MEDICAL RECORD NO.:  [id number]          PATIENT TYPE:  OUT   LOCATION:  SLEEP LAB                     FACILITY:  [facility]   PHYSICIAN:  Marcelyn Bruins, M.D. [facility] DATE OF BIRTH:  1941/08/12   DATE OF STUDY:  06/26/2004  DATE OF DISCHARGE:  06/26/2004                              NOCTURNAL POLYSOMNOGRAM   REFERRING PHYSICIAN:  Osborn Coho, M.D.   INDICATIONS FOR STUDY:  Hypersomnia with sleep apnea.   SLEEP ARCHITECTURE:  The patient had a total sleep time of 360 minutes with  decreased REM. Sleep latency was normal and REM latency was somewhat short.   IMPRESSION/RECOMMENDATIONS:  1.  Mild to moderate obstructive sleep apnea with a respiratory disturbance      index of 19 events per hour and O2 desaturations as low as 84%. The      patient did not need split night criteria secondary to most of his      events occurring later in the study. There were central events scored      during the study that appear primarily to be more mixed in origin.      Events were not positional.  2.  Moderate to loud snoring noted.  3.  No clinically significant cardiac arrhythmia.  4.  Very large numbers of leg jerks with significant sleep disruption.      Clinical correlation is suggested.      KC/MEDQ  D:  07/12/2004 09:42:27  T:  07/12/2004 14:35:28  Job:  [id number]

## 2011-05-03 ENCOUNTER — Other Ambulatory Visit: Payer: Self-pay | Admitting: Internal Medicine

## 2011-05-16 ENCOUNTER — Encounter: Payer: Self-pay | Admitting: Internal Medicine

## 2011-05-16 ENCOUNTER — Other Ambulatory Visit: Payer: Self-pay | Admitting: Internal Medicine

## 2011-05-16 ENCOUNTER — Ambulatory Visit (INDEPENDENT_AMBULATORY_CARE_PROVIDER_SITE_OTHER): Payer: Medicare Other | Admitting: Internal Medicine

## 2011-05-16 VITALS — BP 118/76 | HR 72 | Temp 98.7°F | Wt 212.0 lb

## 2011-05-16 DIAGNOSIS — R21 Rash and other nonspecific skin eruption: Secondary | ICD-10-CM

## 2011-05-16 MED ORDER — CEPHALEXIN 500 MG PO CAPS: 500.0000 mg | ORAL_CAPSULE | Freq: Two times a day (BID) | ORAL | Status: AC

## 2011-05-16 NOTE — Patient Instructions (Signed)
This could still be a fungus infection of the feet.  Because there are pustules we should also add antibacterial medication Take keflex  Pills for bacteria And then TOpical lamisil  Twice a day for at least 2 weeks  Keep foot dry as possible . Call if fever pain spreading or not getting better as above.

## 2011-05-20 DIAGNOSIS — R21 Rash and other nonspecific skin eruption: Secondary | ICD-10-CM | POA: Insufficient documentation

## 2011-05-20 NOTE — Progress Notes (Signed)
  Subjective:    Patient ID: Bryan Cabrera, male    DOB: [birth year], 70 y.o.   MRN: [id number]  HPI  the patient comes in for acute  Problem was crossing his feet and noted  Rash on bottom if right foot without itching pain or soreness.  Treated with otc tinactin spray and ? If made it worse. Is spreading a bit . No fever injury change in shoes and other foot is clear .  No recent atheletes foot.  No hx of same rash Review of Systems No fever joint pain hives      Past history family history social history reviewed in the electronic medical record.  Objective:   Physical Exam WDWN in nad looks well Right foot : nl by inspection eexcept for rash.  Arch of foot has scattered discrete red brown papules/pustules  without vesicles and some satellite lesions.  Toes are clear as well as nails.  Left foot   Clear  Both NV intact     Assessment & Plan:  dermatitis rash on foot. ? Cause  .  Poss atypical tinea  But has some pustules and spread   Will rx for both.  No obv contact  By hx and exam.

## 2011-07-10 LAB — POCT I-STAT, CHEM 8
Calcium, Ion: 1.24
Chloride: 106
Creatinine, Ser: 1
Glucose, Bld: 101 — ABNORMAL HIGH
Potassium: 3.4 — ABNORMAL LOW

## 2011-09-04 ENCOUNTER — Telehealth: Payer: Self-pay | Admitting: *Deleted

## 2011-09-04 MED ORDER — VALSARTAN-HYDROCHLOROTHIAZIDE 320-12.5 MG PO TABS: 1.0000 | ORAL_TABLET | Freq: Every day | ORAL | Status: DC

## 2011-09-04 NOTE — Telephone Encounter (Signed)
refill 

## 2011-09-13 ENCOUNTER — Encounter: Payer: Self-pay | Admitting: Internal Medicine

## 2011-09-13 ENCOUNTER — Ambulatory Visit (INDEPENDENT_AMBULATORY_CARE_PROVIDER_SITE_OTHER): Payer: Medicare Other | Admitting: Internal Medicine

## 2011-09-13 VITALS — BP 128/82 | HR 92 | Temp 98.5°F | Wt 212.0 lb

## 2011-09-13 DIAGNOSIS — R5381 Other malaise: Secondary | ICD-10-CM

## 2011-09-13 DIAGNOSIS — Z23 Encounter for immunization: Secondary | ICD-10-CM

## 2011-09-13 DIAGNOSIS — M25559 Pain in unspecified hip: Secondary | ICD-10-CM

## 2011-09-13 DIAGNOSIS — G4733 Obstructive sleep apnea (adult) (pediatric): Secondary | ICD-10-CM

## 2011-09-13 DIAGNOSIS — M25552 Pain in left hip: Secondary | ICD-10-CM

## 2011-09-13 DIAGNOSIS — I10 Essential (primary) hypertension: Secondary | ICD-10-CM

## 2011-09-13 DIAGNOSIS — E291 Testicular hypofunction: Secondary | ICD-10-CM

## 2011-09-13 DIAGNOSIS — R5383 Other fatigue: Secondary | ICD-10-CM

## 2011-09-13 MED ORDER — TESTOSTERONE 20.25 MG/ACT (1.62%) TD GEL: 2.0000 | TRANSDERMAL | Status: DC

## 2011-09-13 MED ORDER — VALSARTAN-HYDROCHLOROTHIAZIDE 320-12.5 MG PO TABS: 1.0000 | ORAL_TABLET | Freq: Every day | ORAL | Status: DC

## 2011-09-13 NOTE — Patient Instructions (Addendum)
Continue blood pressure medication and monitoring. I think the hip back pain will get better with time  As discussed . Call if needed about this.  Can begin testosterone   2 pump per day.  Plan wellness and lab fu at that visit .

## 2011-09-13 NOTE — Progress Notes (Signed)
  Subjective:    Patient ID: Bryan Cabrera, male    DOB: 01-30-1941, 70 y.o.   MRN: [id number]  HPI  Patient comes in today for follow up of  multiple medical problems.    HT  No change in meds  Has log of readings most below 140/90 and ocass  Above denies se of meds  Left  Deep hip lateral  Pain for about 10 days after dragging leaves to curb. Multiple times area left low back lateral thigh to lateral leg. No groin pain hurts with some walking but getting better no fall no injury or weakness, took extra asa with help  Sleep is better   needs flu shot  Has read up on the testosterone supplement and wants to try this as has been documented low in the  Past .    Review of Systems No cp sob falling vision change bleeding neuro problems   Past history family history social history reviewed in the electronic medical record.     Objective:   Physical Exam  WDWN in nad  Neck: Supple without adenopathy or masses or bruits Chest:  Clear to A&P without wheezes rales or rhonchi CV:  S1-S2 no gallops or murmurs peripheral perfusion is normal Abdomen:  Sof,t normal bowel sounds without hepatosplenomegaly, no guarding rebound or masses no CVA tenderness No clubbing cyanosis or edema Oriented x 3 and no noted deficits in memory, attention, and speech. Reviewed log  Of bp readings and last labs  Lab Results  Component Value Date   WBC 6.1 02/17/2011   HGB 14.8 02/17/2011   HCT 42.0 02/17/2011   PLT 191.0 02/17/2011   GLUCOSE 96 02/17/2011   CHOL 195 02/17/2011   TRIG 234.0* 02/17/2011   HDL 41.50 02/17/2011   LDLDIRECT 119.5 02/17/2011   LDLCALC 84 01/27/2010   ALT 24 02/17/2011   AST 22 02/17/2011   NA 143 02/17/2011   K 4.9 02/17/2011   CL 106 02/17/2011   CREATININE 1.0 02/17/2011   BUN 18 02/17/2011   CO2 28 02/17/2011   TSH 1.95 02/17/2011   PSA 0.92 02/17/2011   HGBA1C 5.7 02/17/2011   Lab Results  Component Value Date   TESTOSTERONE 247.84* 02/17/2011      Assessment & Plan:  hypertension Home readings  are adequately controlled and no se of meds to continue  Left hip  NEWare pain  New sounds like overuse of bursitis type pain nogroin pain or other alarm features .    Expectant management. And if  persistent or progressive call for advice or consult.  Avoid inciting activity.  OSA sleeping better   Declined other interventions  Low testosterone and fatigue .Marland KitchenMarland KitchenNEW treatment Present in past 2 readings  Pt aware of risk benefit.   Ok to start and then check level in 3-4 months and get labs at wellness visit in the spring.  rx given today

## 2011-09-14 DIAGNOSIS — M25552 Pain in left hip: Secondary | ICD-10-CM | POA: Insufficient documentation

## 2011-12-26 ENCOUNTER — Telehealth: Payer: Self-pay | Admitting: Internal Medicine

## 2011-12-26 NOTE — Telephone Encounter (Signed)
Pt scheduled for a fasting cpx on 3/29 we are closed that day and when can pt be worked in, next cpx is not until september

## 2011-12-27 NOTE — Telephone Encounter (Signed)
canuse April 16 at the [address] [facility] slot  I

## 2012-01-03 ENCOUNTER — Encounter: Payer: Self-pay | Admitting: Internal Medicine

## 2012-01-03 ENCOUNTER — Ambulatory Visit (INDEPENDENT_AMBULATORY_CARE_PROVIDER_SITE_OTHER): Payer: Medicare Other | Admitting: Internal Medicine

## 2012-01-03 VITALS — BP 150/90 | HR 60 | Temp 98.3°F | Wt 215.0 lb

## 2012-01-03 DIAGNOSIS — M549 Dorsalgia, unspecified: Secondary | ICD-10-CM

## 2012-01-03 DIAGNOSIS — E291 Testicular hypofunction: Secondary | ICD-10-CM

## 2012-01-03 DIAGNOSIS — R21 Rash and other nonspecific skin eruption: Secondary | ICD-10-CM | POA: Insufficient documentation

## 2012-01-03 DIAGNOSIS — R109 Unspecified abdominal pain: Secondary | ICD-10-CM

## 2012-01-03 LAB — POCT URINALYSIS DIPSTICK
Bilirubin, UA: NEGATIVE
Blood, UA: NEGATIVE
Glucose, UA: NEGATIVE
Spec Grav, UA: 1.025
pH, UA: 5.5

## 2012-01-03 NOTE — Patient Instructions (Addendum)
Plan CT Of" abdomen" to check kidney for stones .   Call if need something for pain or spasm . It is still possible that this is muscular back spasm.   This may take a week to get past.  Avoid lifting and shoveling in the meantime.  Observe the feet rash  Do not think serious at this point .  Contact us if gets large and painful  Spreading or fever .

## 2012-01-03 NOTE — Progress Notes (Signed)
  Subjective:    Patient ID: Bryan Cabrera, male    DOB: 12-Jun-1941, 71 y.o.   MRN: [id number]  HPI  Patient comes in today for SDA for  new problem evaluation.  Discomfort between shoulder blades  Of and on periscapular  Left area  Without cp sob  And now moved down to left flank "area where kidney is"  since  Shoveling  in yard . No fever radiation NVD pain 4-6/10 and waxing and waning.   NO uti sx no hematuria.  Has spots on feet ankles for 2 weeks fading ? No sig itching no bugs  . Not in other places  Worried about infections. No hx of  Pain or renal stones no specific injury otherwise  Review of Systems ROS:  GEN/ HEENT: No fever, significant weight changes sweats headachesCV/ PULM; No chest pain shortness of breath cough, syncope,edema  change in exercise tolerance. GI /GU: No  vomiting, change in bowel habits. No blood in the stool. No significant GU symptoms. SKIN/HEME: ,no acute skin rashes suspicious lesions or bleeding. No lymphadenopathy, nodules, masses.  NEURO/ PSYCH:  No neurologic signs such as weakness numbness. No depression anxiety. IMM/ Allergy: No unusual infections.  Allergy .    Has fatigue  Still hasnt started the testosterone. REST of 12 system review negative except as per HPI   Past history family history social history reviewed in the electronic medical record.     Objective:   Physical Exam  WDWN in nad   HEENT:  grossly nl Neck: Supple without adenopathy or masses or bruits Chest:  Clear to A&P without wheezes rales or rhonchi CV:  S1-S2 no gallops or murmurs peripheral perfusion is normal  UE pulses =  And full  Abdomen:  Sof,t normal bowel sounds without hepatosplenomegaly, no guarding rebound or masses left   cva pain and muscle spasm standing?   No rash   Neg psoas sign  Neuro grossly intact  SKin :   Feet clear but few faded papule dark color without streakling  No pustules or vesicle  One on left med ankle and 3 on right foot ankle.        Assessment & Plan:  Upper back and now left flank pain colicky like but no hx of stones . Has spasm and no radiation. UA is clear  Disc options    Ct abd pelvis no contrast   To be done tomorrow  Call report  Contact on call service if worse tonight or as needed.  Declined pain med and muscle relaxant   RASH papules fading  Do not seem serious cause and not infection   Contact us with alarm features.  Low testosterone  Never got filled   Still tired   Cn do this and or readdress at CPX in April.

## 2012-01-04 ENCOUNTER — Ambulatory Visit (INDEPENDENT_AMBULATORY_CARE_PROVIDER_SITE_OTHER)
Admission: RE | Admit: 2012-01-04 | Discharge: 2012-01-04 | Disposition: A | Discharge status: Home / Self Care | Payer: Medicare Other | Source: Ambulatory Visit | Attending: Cardiology | Admitting: Cardiology

## 2012-01-04 DIAGNOSIS — M549 Dorsalgia, unspecified: Secondary | ICD-10-CM

## 2012-01-04 DIAGNOSIS — R109 Unspecified abdominal pain: Secondary | ICD-10-CM

## 2012-01-04 NOTE — Progress Notes (Signed)
Quick Note:  Pt aware of results. ______ 

## 2012-01-12 ENCOUNTER — Encounter: Payer: Medicare Other | Admitting: Internal Medicine

## 2012-01-30 ENCOUNTER — Encounter: Payer: Self-pay | Admitting: Internal Medicine

## 2012-01-30 ENCOUNTER — Ambulatory Visit (INDEPENDENT_AMBULATORY_CARE_PROVIDER_SITE_OTHER): Payer: Medicare Other | Admitting: Internal Medicine

## 2012-01-30 VITALS — BP 134/72 | HR 88 | Temp 98.4°F | Ht 68.5 in | Wt 205.0 lb

## 2012-01-30 DIAGNOSIS — M545 Low back pain, unspecified: Secondary | ICD-10-CM

## 2012-01-30 DIAGNOSIS — Z1211 Encounter for screening for malignant neoplasm of colon: Secondary | ICD-10-CM

## 2012-01-30 DIAGNOSIS — E781 Pure hyperglyceridemia: Secondary | ICD-10-CM

## 2012-01-30 DIAGNOSIS — Z0000 Encounter for general adult medical examination without abnormal findings: Secondary | ICD-10-CM

## 2012-01-30 DIAGNOSIS — G4733 Obstructive sleep apnea (adult) (pediatric): Secondary | ICD-10-CM

## 2012-01-30 DIAGNOSIS — R7309 Other abnormal glucose: Secondary | ICD-10-CM

## 2012-01-30 DIAGNOSIS — K469 Unspecified abdominal hernia without obstruction or gangrene: Secondary | ICD-10-CM

## 2012-01-30 DIAGNOSIS — E291 Testicular hypofunction: Secondary | ICD-10-CM

## 2012-01-30 DIAGNOSIS — J310 Chronic rhinitis: Secondary | ICD-10-CM

## 2012-01-30 DIAGNOSIS — I10 Essential (primary) hypertension: Secondary | ICD-10-CM

## 2012-01-30 DIAGNOSIS — N529 Male erectile dysfunction, unspecified: Secondary | ICD-10-CM

## 2012-01-30 DIAGNOSIS — M549 Dorsalgia, unspecified: Secondary | ICD-10-CM

## 2012-01-30 LAB — BASIC METABOLIC PANEL
BUN: 18 mg/dL (ref 6–23)
CO2: 26 mEq/L (ref 19–32)
Chloride: 106 mEq/L (ref 96–112)
Creatinine, Ser: 0.9 mg/dL (ref 0.4–1.5)
Glucose, Bld: 100 mg/dL — ABNORMAL HIGH (ref 70–99)
Potassium: 3.5 mEq/L (ref 3.5–5.1)

## 2012-01-30 LAB — LIPID PANEL
Cholesterol: 162 mg/dL (ref 0–200)
HDL: 42.1 mg/dL (ref >39.00)
LDL Cholesterol: 90 mg/dL (ref 0–99)
Triglycerides: 148 mg/dL (ref 0.0–149.0)

## 2012-01-30 LAB — CBC WITH DIFFERENTIAL/PLATELET
Basophils Absolute: 0 10*3/uL (ref 0.0–0.1)
Eosinophils Absolute: 0.2 10*3/uL (ref 0.0–0.7)
Eosinophils Relative: 3.6 % (ref 0.0–5.0)
HCT: 43.4 % (ref 39.0–52.0)
Lymphs Abs: 1.6 10*3/uL (ref 0.7–4.0)
MCHC: 34.4 g/dL (ref 30.0–36.0)
MCV: 86 fl (ref 78.0–100.0)
Monocytes Absolute: 0.4 10*3/uL (ref 0.1–1.0)
Platelets: 189 10*3/uL (ref 150.0–400.0)
RDW: 12.8 % (ref 11.5–14.6)

## 2012-01-30 LAB — HEPATIC FUNCTION PANEL
ALT: 24 U/L (ref 0–53)
Total Bilirubin: 0.7 mg/dL (ref 0.3–1.2)

## 2012-01-30 LAB — HEMOGLOBIN A1C: Hgb A1c MFr Bld: 5.8 % (ref 4.6–6.5)

## 2012-01-30 MED ORDER — TADALAFIL 20 MG PO TABS: 20.0000 mg | ORAL_TABLET | Freq: Every day | ORAL | Status: DC | PRN

## 2012-01-30 NOTE — Progress Notes (Signed)
Subjective:    Patient ID: Bryan Cabrera, male    DOB: Oct 03, 1941, 71 y.o.   MRN: [id number]  HPI  Patient comes in today for preventive visit and follow-up of medical issues. Medicare wellness also Update  history since  last visit: No major changes ; ,injury surgery or hospitalizations. Inguinal pain better  Ct showed small umbi and ing hernia with fat no change in bowels' has never had a colonoscopy  HT readings today to eview are mostly at goal . Pulse normal. Has been losing weight with lifestyle intervention healthy eating and exercise .  Feels some better  NEver took the testosterone cause of ? Of risk  Does have some ed and asks about this    Hearing:  Ok   Vision:  No limitations at present .  Reading glasses cataract  Removal  Last  Year.   Safety:  Has smoke detector and wears seat belts.  No firearms. No excess sun exposure. Sees dentist regularly.  Falls:  None   Advance directive :  Reviewed    Memory: Felt to be good  , no concern from her or her family.  Depression: No anhedonia unusual crying or depressive symptoms  Nutrition: Eats well balanced diet; adequate calcium and vitamin D. No swallowing chewiing problems.  Injury: no major injuries in the last six months.  Other healthcare providers:  Reviewed today .  Social:   married. 2 cats    Preventive parameters: no colonoscopy,, immunizations. Says never had varicellla and caution with  Getting zostavax  Including Tdap and pneumovax.  ADLS:   There are no problems or need for assistance  driving, feeding, obtaining food, dressing, toileting and bathing, managing money using phone. is independent. He continues to work   Full time   Review of Systems ROS:  GEN/ HEENT: No fever, significant weight changes sweats headaches vision problems hearing changes, CV/ PULM; No chest pain shortness of breath cough, syncope,edema  change in exercise tolerance. GI /GU: No adominal pain, vomiting, change in bowel  habits. No blood in the stool. SKIN/HEME: ,no acute skin rashes suspicious lesions or bleeding. No lymphadenopathy, nodules, masses.  NEURO/ PSYCH:  No neurologic signs such as weakness numbness. No depression anxiety. IMM/ Allergy: No unusual infections.  Allergy .   nasonex helps  Snoring sleep apnea  As in past  Has weight loss Gets some LBP at times   No radiation or weakness   REST of 12 system review negative except as per HPI  Past history family history social history reviewed in the electronic medical record. Outpatient Prescriptions Prior to Visit  Medication Sig Dispense Refill  . Coenzyme Q10 (CO Q 10) 10 MG CAPS Take by mouth.        Marland Kitchen NASONEX 50 MCG/ACT nasal spray USE AS DIRECTED  1 Inhaler  5  . Omega-3 Fatty Acids (FISH OIL) 1000 MG CAPS Take by mouth.        . penciclovir (DENAVIR) 1 % cream Apply topically every 2 (two) hours. Prn        . valsartan-hydrochlorothiazide (DIOVAN HCT) 320-12.5 MG per tablet Take 1 tablet by mouth daily.  30 tablet  5  . Testosterone (ANDROGEL PUMP) 20.25 MG/ACT (1.62%) GEL Place 2 Act onto the skin 1 day or 1 dose.  75 g  3       Objective:   Physical Exam BP 134/72  Pulse 88  Temp(Src) 98.4 F (36.9 C) (Oral)  Wt 205 lb (92.987 kg)  SpO2 97% Physical Exam: Vital signs reviewed WUJ:WJXB is a well-developed well-nourished alert cooperative  White male  who appears   stated age in no acute distress.  HEENT: normocephalic  traumatic , Eyes: PERRL EOM's full, conjunctiva clear, Nares: patent no deformity discharge or tenderness., Ears: no deformity EAC's clear TMs with normal landmarks. Mouth: clear OP, no lesions, edema.  Moist mucous membranes. Dentition in adequate repair. NECK: supple without masses, thyromegaly or bruits. CHEST/PULM:  Clear to auscultation and percussion breath sounds equal no wheeze , rales or rhonchi. No chest wall deformities or tenderness. CV: PMI is nondisplaced, S1 S2 no gallops, murmurs, rubs. Peripheral  pulses are full without delay.No JVD .  ABDOMEN: Bowel sounds normal nontender  No guard or rebound, no hepato splenomegal no CVA tenderness.   Extremtities:  No clubbing cyanosis or edema, no acute joint swelling or redness no focal atrophy NEURO:  Oriented x3, cranial nerves 3-12 appear to be intact, no obvious focal weakness,gait within normal limits no abnormal reflexes or asymmetrical SKIN: No acute rashes normal turgor, color, no bruising or petechiae. PSYCH: Oriented, good eye contact, no obvious depression anxiety, cognition and judgment appear normal. LN:  No cervical axillary or inguinal adenopathy      Assessment & Plan:    Preventive Health Care Counseled regarding healthy nutrition, exercise, sleep, injury prevention, calcium vit d and healthy weight . Continue  P t insists he didn't have varicella  Disc that he prob did cause of age and his children had them . neverthe less will add varicella 1gg to his labs  . Disc zostavax  Can get if wishes  Check into cost.  HT: controlled a this  time.  No change in meds  Due for labs  Hx of borderline  Hyperglycemia   Inguinal pain: resolved ? Cause  Has 2 small hernias  umbi and ing  Observe for now   Hypogonadism:  ED   I think risk is less with trial of cialis instead of testosterone at this point  Ct showed some As in arteries   OSA: no recent studies asses but poss better with weight loss and nasonex LBP at times  prob djd  No alarm features at present continue acitivity dec prolonged sitting and lifting  Will follow

## 2012-01-30 NOTE — Patient Instructions (Signed)
Continue to lose weight and this should help back  And overall health.  Check with insurance about shingle vaccine  . Will check to see if you had varicella in the past as we discussed .  Can try cialis  And hold on the testosterone for now as we discussed.  Will do referral fr colonoscopy.   Back Pain, Adult Low back pain is very common. About 1 in 5 people have back pain.The cause of low back pain is rarely dangerous. The pain often gets better over time.About half of people with a sudden onset of back pain feel better in just 2 weeks. About 8 in 10 people feel better by 6 weeks.  CAUSES Some common causes of back pain include:  Strain of the muscles or ligaments supporting the spine.   Wear and tear (degeneration) of the spinal discs.   Arthritis.   Direct injury to the back.  DIAGNOSIS Most of the time, the direct cause of low back pain is not known.However, back pain can be treated effectively even when the exact cause of the pain is unknown.Answering your caregiver's questions about your overall health and symptoms is one of the most accurate ways to make sure the cause of your pain is not dangerous. If your caregiver needs more information, he or she may order lab work or imaging tests (X-rays or MRIs).However, even if imaging tests show changes in your back, this usually does not require surgery. HOME CARE INSTRUCTIONS For many people, back pain returns.Since low back pain is rarely dangerous, it is often a condition that people can learn to [facility] their own.   Remain active. It is stressful on the back to sit or stand in one place. Do not sit, drive, or stand in one place for more than 30 minutes at a time. Take short walks on level surfaces as soon as pain allows.Try to increase the length of time you walk each day.   Do not stay in bed.Resting more than 1 or 2 days can delay your recovery.   Do not avoid exercise or work.Your body is made to move.It is not  dangerous to be active, even though your back may hurt.Your back will likely heal faster if you return to being active before your pain is gone.   Pay attention to your body when you bend and lift. Many people have less discomfortwhen lifting if they bend their knees, keep the load close to their bodies,and avoid twisting. Often, the most comfortable positions are those that put less stress on your recovering back.   Find a comfortable position to sleep. Use a firm mattress and lie on your side with your knees slightly bent. If you lie on your back, put a pillow under your knees.   Only take over-the-counter or prescription medicines as directed by your caregiver. Over-the-counter medicines to reduce pain and inflammation are often the most helpful.Your caregiver may prescribe muscle relaxant drugs.These medicines help dull your pain so you can more quickly return to your normal activities and healthy exercise.   Put ice on the injured area.   Put ice in a plastic bag.   Place a towel between your skin and the bag.   Leave the ice on for 15 to 20 minutes, 3 to 4 times a day for the first 2 to 3 days. After that, ice and heat may be alternated to reduce pain and spasms.   Ask your caregiver about trying back exercises and gentle massage. This may  be of some benefit.   Avoid feeling anxious or stressed.Stress increases muscle tension and can worsen back pain.It is important to recognize when you are anxious or stressed and learn ways to manage it.Exercise is a great option.  SEEK MEDICAL CARE IF:  You have pain that is not relieved with rest or medicine.   You have pain that does not improve in 1 week.   You have new symptoms.   You are generally not feeling well.  SEEK IMMEDIATE MEDICAL CARE IF:   You have pain that radiates from your back into your legs.   You develop new bowel or bladder control problems.   You have unusual weakness or numbness in your arms or legs.    You develop nausea or vomiting.   You develop abdominal pain.   You feel faint.  Document Released: 10/02/2005 Document Revised: 09/21/2011 Document Reviewed: 02/20/2011 [facility] Patient Information 2012 [place], [place].

## 2012-02-02 ENCOUNTER — Encounter (INDEPENDENT_AMBULATORY_CARE_PROVIDER_SITE_OTHER): Payer: Self-pay | Admitting: *Deleted

## 2012-02-04 DIAGNOSIS — Z0000 Encounter for general adult medical examination without abnormal findings: Secondary | ICD-10-CM | POA: Insufficient documentation

## 2012-02-04 DIAGNOSIS — M549 Dorsalgia, unspecified: Secondary | ICD-10-CM | POA: Insufficient documentation

## 2012-02-04 DIAGNOSIS — Z1211 Encounter for screening for malignant neoplasm of colon: Secondary | ICD-10-CM | POA: Insufficient documentation

## 2012-02-04 DIAGNOSIS — K469 Unspecified abdominal hernia without obstruction or gangrene: Secondary | ICD-10-CM | POA: Insufficient documentation

## 2012-06-25 ENCOUNTER — Encounter: Payer: Self-pay | Admitting: Internal Medicine

## 2012-06-25 ENCOUNTER — Ambulatory Visit (INDEPENDENT_AMBULATORY_CARE_PROVIDER_SITE_OTHER): Payer: Medicare Other | Admitting: Internal Medicine

## 2012-06-25 VITALS — BP 140/88 | HR 85 | Temp 98.6°F | Wt 207.0 lb

## 2012-06-25 DIAGNOSIS — Q849 Congenital malformation of integument, unspecified: Secondary | ICD-10-CM

## 2012-06-25 DIAGNOSIS — D485 Neoplasm of uncertain behavior of skin: Secondary | ICD-10-CM

## 2012-06-25 DIAGNOSIS — I10 Essential (primary) hypertension: Secondary | ICD-10-CM

## 2012-06-25 DIAGNOSIS — L989 Disorder of the skin and subcutaneous tissue, unspecified: Secondary | ICD-10-CM

## 2012-06-25 NOTE — Patient Instructions (Signed)
You are a candidate for the shingles shot . And flu shot .   I  agree that the back ain sounds like a mechanical strain.  No change  In  your blood pressure . Medications for now.  Keep well hydrated in the heat ,     Continue lifestyle intervention healthy eating and exercise . See a dermatologist . About the skin areas of concern.  Wellness visit   Feb or early march

## 2012-06-25 NOTE — Progress Notes (Signed)
Subjective:    Patient ID: Bryan Cabrera, male    DOB: 06/30/1941, 71 y.o.   MRN: [id number]  HPI Patient comes in today for follow up of  multiple medical problems.  Since last visit he has no major changes in his health status. Blood pressure readings are still pretty good occasional up in the 150s. He brings in his log. He gets occasional dizziness when is very hot out has to drink more water. No changes in his vision hearing syncope cough. Has a few skin areas of question. He was taking coenzyme Q 10 and ran out. Had episode of significant low back pain on the left with some radiation but no weakness or tingling when outside camping back bent over for a while took a while to get better. No other associated symptoms.  Review of Systems Negative chest pain syncope treatment for OS mouth piece didn't work out. Maybe seeing a dentist. Check out a few skin areas  Past history family history social history reviewed in the electronic medical record.     Objective:   Physical Exam BP 160/84  Pulse 85  Temp 98.6 F (37 C)  Wt 207 lb (93.895 kg)  SpO2 96% Well-developed well-nourished in no acute distress repeat blood pressure right 140/88 large cuff small cuff 155/90 review of his readings show most are in the 130 range Neck without masses or bruits heard  chest clear to auscultation cardiac S1-S2 no gallops murmurs upper sternal pulses are normal Skin back couple of moles area of to time that I think is a separate care ptosis left dorsal hand with irregular brown mole with a darker scaly mole versus FK. 4 head and face shows a few red patchy areas that are scaly Neurologic is grossly intact oriented cognition normal gait normal  Reviewed past lab tests with him as well as the CT scan  Lab Results  Component Value Date   WBC 5.0 01/30/2012   HGB 14.9 01/30/2012   HCT 43.4 01/30/2012   PLT 189.0 01/30/2012   GLUCOSE 100* 01/30/2012   CHOL 162 01/30/2012   TRIG 148.0 01/30/2012   HDL  42.10 01/30/2012   LDLDIRECT 119.5 02/17/2011   LDLCALC 90 01/30/2012   ALT 24 01/30/2012   AST 21 01/30/2012   NA 140 01/30/2012   K 3.5 01/30/2012   CL 106 01/30/2012   CREATININE 0.9 01/30/2012   BUN 18 01/30/2012   CO2 26 01/30/2012   TSH 1.94 01/30/2012   PSA 0.92 02/17/2011   HGBA1C 5.8 01/30/2012       Assessment & Plan:   Hypertension   Most readings are at goal    continue for now has history of side effects of other medicines were lack of control.  Monitor blood pressure and ensure hydration in hot weather I suspect he may have some mild heat  laded symptoms discussed pre-hydration. Contact us earlier if his readings are elevated on her or too low on a regular basis otherwise wellness visit in February March 2014  labs at that visit.  Skin areas sun exposure a number of S. case a couple of scaly areas on the face and fore head of uncertain character another area on his left hand . Advisesee a dermatologist will make his own appointment or call for referral as needed  Hx of lbp sounds like strain and not related to internal cause otherwise.  Discussed immunizations he is a candidate for Zostavax discuss flu shot; wants to wait at this  time  Never got do the colonoscopy and will reschedule   Contact us if needed Total visit > 50% spent counseling and coordinating care

## 2012-07-02 ENCOUNTER — Ambulatory Visit: Payer: Medicare Other | Admitting: Internal Medicine

## 2012-09-02 ENCOUNTER — Other Ambulatory Visit: Payer: Self-pay | Admitting: Family Medicine

## 2012-09-02 MED ORDER — VALSARTAN-HYDROCHLOROTHIAZIDE 320-12.5 MG PO TABS: 1.0000 | ORAL_TABLET | Freq: Every day | ORAL | Status: DC

## 2012-11-18 ENCOUNTER — Ambulatory Visit: Payer: Medicare Other | Admitting: Internal Medicine

## 2012-11-26 ENCOUNTER — Ambulatory Visit (INDEPENDENT_AMBULATORY_CARE_PROVIDER_SITE_OTHER): Payer: PRIVATE HEALTH INSURANCE | Admitting: Internal Medicine

## 2012-11-26 ENCOUNTER — Encounter: Payer: Self-pay | Admitting: Internal Medicine

## 2012-11-26 VITALS — BP 150/70 | HR 87 | Temp 98.2°F | Ht 68.0 in | Wt 208.0 lb

## 2012-11-26 DIAGNOSIS — L989 Disorder of the skin and subcutaneous tissue, unspecified: Secondary | ICD-10-CM

## 2012-11-26 DIAGNOSIS — H938X9 Other specified disorders of ear, unspecified ear: Secondary | ICD-10-CM

## 2012-11-26 DIAGNOSIS — E781 Pure hyperglyceridemia: Secondary | ICD-10-CM

## 2012-11-26 DIAGNOSIS — R7309 Other abnormal glucose: Secondary | ICD-10-CM

## 2012-11-26 DIAGNOSIS — Z0000 Encounter for general adult medical examination without abnormal findings: Secondary | ICD-10-CM

## 2012-11-26 DIAGNOSIS — H00019 Hordeolum externum unspecified eye, unspecified eyelid: Secondary | ICD-10-CM

## 2012-11-26 DIAGNOSIS — Q849 Congenital malformation of integument, unspecified: Secondary | ICD-10-CM

## 2012-11-26 DIAGNOSIS — Z1211 Encounter for screening for malignant neoplasm of colon: Secondary | ICD-10-CM

## 2012-11-26 DIAGNOSIS — H938X2 Other specified disorders of left ear: Secondary | ICD-10-CM

## 2012-11-26 DIAGNOSIS — G4733 Obstructive sleep apnea (adult) (pediatric): Secondary | ICD-10-CM

## 2012-11-26 DIAGNOSIS — H9190 Unspecified hearing loss, unspecified ear: Secondary | ICD-10-CM

## 2012-11-26 DIAGNOSIS — H00016 Hordeolum externum left eye, unspecified eyelid: Secondary | ICD-10-CM

## 2012-11-26 DIAGNOSIS — I10 Essential (primary) hypertension: Secondary | ICD-10-CM

## 2012-11-26 LAB — CBC WITH DIFFERENTIAL/PLATELET
Basophils Relative: 0.7 % (ref 0.0–3.0)
Eosinophils Absolute: 0.3 10*3/uL (ref 0.0–0.7)
Hemoglobin: 15.1 g/dL (ref 13.0–17.0)
MCHC: 34.6 g/dL (ref 30.0–36.0)
MCV: 84.3 fl (ref 78.0–100.0)
Monocytes Absolute: 0.6 10*3/uL (ref 0.1–1.0)
Neutro Abs: 3.3 10*3/uL (ref 1.4–7.7)
Neutrophils Relative %: 55.6 % (ref 43.0–77.0)
RBC: 5.17 Mil/uL (ref 4.22–5.81)

## 2012-11-26 LAB — LIPID PANEL
Cholesterol: 179 mg/dL (ref 0–200)
HDL: 41.1 mg/dL (ref >39.00)
Triglycerides: 197 mg/dL — ABNORMAL HIGH (ref 0.0–149.0)

## 2012-11-26 LAB — HEPATIC FUNCTION PANEL
Albumin: 4.3 g/dL (ref 3.5–5.2)
Total Protein: 7.6 g/dL (ref 6.0–8.3)

## 2012-11-26 LAB — BASIC METABOLIC PANEL
CO2: 28 mEq/L (ref 19–32)
Chloride: 105 mEq/L (ref 96–112)
Creatinine, Ser: 1 mg/dL (ref 0.4–1.5)

## 2012-11-26 NOTE — Patient Instructions (Addendum)
Look into getting a tetanus shot and shingles ought contact us and come in for this when you wish.  You need to get a colonoscopy can do stool cards in the meantime.  Can use otc hydrocortisone 3 x per day for up to 2 weeks.   Warm compresses to left lower eye lid and should get better over fa few days. I advise seeing audiologist or ent about popping in ear and hearing issue.  We can refer if you wish.   Blood pressure readings more frequently gets some morning readings after sitting down for 5 minutes. If they are not controlled below 145/90 or so contact our office.   If all ok then ROV in 6 months  Or a s needed
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
## 2012-11-28 DIAGNOSIS — H938X9 Other specified disorders of ear, unspecified ear: Secondary | ICD-10-CM | POA: Insufficient documentation

## 2012-11-28 DIAGNOSIS — H00019 Hordeolum externum unspecified eye, unspecified eyelid: Secondary | ICD-10-CM | POA: Insufficient documentation

## 2012-12-02 ENCOUNTER — Other Ambulatory Visit: Payer: Self-pay | Admitting: Family Medicine

## 2012-12-02 MED ORDER — VALSARTAN-HYDROCHLOROTHIAZIDE 320-12.5 MG PO TABS: 1.0000 | ORAL_TABLET | Freq: Every day | ORAL | Status: DC

## 2012-12-04 NOTE — Progress Notes (Signed)
Quick Note:  Left a message for return call. ______ 

## 2012-12-06 ENCOUNTER — Other Ambulatory Visit (INDEPENDENT_AMBULATORY_CARE_PROVIDER_SITE_OTHER): Payer: PRIVATE HEALTH INSURANCE

## 2012-12-06 DIAGNOSIS — Z0000 Encounter for general adult medical examination without abnormal findings: Secondary | ICD-10-CM

## 2012-12-06 LAB — HEMOCCULT GUIAC POC 1CARD (OFFICE)
Card #2 Fecal Occult Blod, POC: NEGATIVE
Card #3 Fecal Occult Blood, POC: NEGATIVE

## 2013-06-09 ENCOUNTER — Other Ambulatory Visit: Payer: Self-pay | Admitting: Internal Medicine

## 2013-11-30 ENCOUNTER — Other Ambulatory Visit: Payer: Self-pay | Admitting: Internal Medicine

## 2013-12-16 ENCOUNTER — Ambulatory Visit (INDEPENDENT_AMBULATORY_CARE_PROVIDER_SITE_OTHER): Payer: Medicare Other | Admitting: Internal Medicine

## 2013-12-16 ENCOUNTER — Encounter: Payer: Self-pay | Admitting: Internal Medicine

## 2013-12-16 VITALS — BP 160/80 | HR 61 | Temp 98.2°F | Ht 67.5 in | Wt 205.0 lb

## 2013-12-16 DIAGNOSIS — Z0000 Encounter for general adult medical examination without abnormal findings: Secondary | ICD-10-CM

## 2013-12-16 DIAGNOSIS — B001 Herpesviral vesicular dermatitis: Secondary | ICD-10-CM

## 2013-12-16 DIAGNOSIS — B009 Herpesviral infection, unspecified: Secondary | ICD-10-CM

## 2013-12-16 DIAGNOSIS — Z1211 Encounter for screening for malignant neoplasm of colon: Secondary | ICD-10-CM

## 2013-12-16 DIAGNOSIS — I10 Essential (primary) hypertension: Secondary | ICD-10-CM

## 2013-12-16 DIAGNOSIS — H9190 Unspecified hearing loss, unspecified ear: Secondary | ICD-10-CM

## 2013-12-16 DIAGNOSIS — R7309 Other abnormal glucose: Secondary | ICD-10-CM

## 2013-12-16 DIAGNOSIS — R21 Rash and other nonspecific skin eruption: Secondary | ICD-10-CM

## 2013-12-16 LAB — HEPATIC FUNCTION PANEL
ALBUMIN: 4.3 g/dL (ref 3.5–5.2)
ALT: 23 U/L (ref 0–53)
AST: 23 U/L (ref 0–37)
Alkaline Phosphatase: 53 U/L (ref 39–117)
Bilirubin, Direct: 0 mg/dL (ref 0.0–0.3)
Total Bilirubin: 0.9 mg/dL (ref 0.3–1.2)
Total Protein: 7.4 g/dL (ref 6.0–8.3)

## 2013-12-16 LAB — BASIC METABOLIC PANEL
BUN: 14 mg/dL (ref 6–23)
CHLORIDE: 106 meq/L (ref 96–112)
CO2: 27 mEq/L (ref 19–32)
Calcium: 10.2 mg/dL (ref 8.4–10.5)
Creatinine, Ser: 1 mg/dL (ref 0.4–1.5)
GFR: 74.3 mL/min (ref >60.00)
GLUCOSE: 94 mg/dL (ref 70–99)
POTASSIUM: 4.4 meq/L (ref 3.5–5.1)
SODIUM: 141 meq/L (ref 135–145)

## 2013-12-16 LAB — CBC WITH DIFFERENTIAL/PLATELET
BASOS ABS: 0 10*3/uL (ref 0.0–0.1)
Basophils Relative: 0.6 % (ref 0.0–3.0)
EOS PCT: 5.5 % — AB (ref 0.0–5.0)
Eosinophils Absolute: 0.3 10*3/uL (ref 0.0–0.7)
HEMATOCRIT: 44.4 % (ref 39.0–52.0)
HEMOGLOBIN: 14.9 g/dL (ref 13.0–17.0)
LYMPHS ABS: 1.9 10*3/uL (ref 0.7–4.0)
Lymphocytes Relative: 33.7 % (ref 12.0–46.0)
MCHC: 33.5 g/dL (ref 30.0–36.0)
MCV: 86.2 fl (ref 78.0–100.0)
MONO ABS: 0.5 10*3/uL (ref 0.1–1.0)
MONOS PCT: 8.2 % (ref 3.0–12.0)
NEUTROS ABS: 2.9 10*3/uL (ref 1.4–7.7)
Neutrophils Relative %: 52 % (ref 43.0–77.0)
PLATELETS: 223 10*3/uL (ref 150.0–400.0)
RBC: 5.15 Mil/uL (ref 4.22–5.81)
RDW: 12.9 % (ref 11.5–14.6)
WBC: 5.6 10*3/uL (ref 4.5–10.5)

## 2013-12-16 LAB — LIPID PANEL
CHOLESTEROL: 185 mg/dL (ref 0–200)
HDL: 43.8 mg/dL (ref >39.00)
LDL Cholesterol: 105 mg/dL — ABNORMAL HIGH (ref 0–99)
TRIGLYCERIDES: 183 mg/dL — AB (ref 0.0–149.0)
Total CHOL/HDL Ratio: 4
VLDL: 36.6 mg/dL (ref 0.0–40.0)

## 2013-12-16 LAB — HEMOGLOBIN A1C: HEMOGLOBIN A1C: 5.7 % (ref 4.6–6.5)

## 2013-12-16 LAB — TSH: TSH: 1.76 u[IU]/mL (ref 0.35–5.50)

## 2013-12-16 MED ORDER — TRIAMCINOLONE ACETONIDE 0.5 % EX CREA
1.0000 | TOPICAL_CREAM | Freq: Three times a day (TID) | CUTANEOUS | Status: DC
Start: 2013-12-16 — End: 2014-05-07

## 2013-12-16 MED ORDER — PENCICLOVIR 1 % EX CREA: TOPICAL_CREAM | CUTANEOUS | Status: DC

## 2013-12-16 MED ORDER — VALSARTAN-HYDROCHLOROTHIAZIDE 320-12.5 MG PO TABS: ORAL_TABLET | ORAL | Status: DC
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
## 2013-12-16 NOTE — Patient Instructions (Signed)
Still consider  Colonoscopy cancer screening   Will send a referral. Rash seems like a contact rash  Can try  Topical cortisone. In the short run.  Will notify you  of labs when available. Advise prenar 13 pneumovax 1 year later .  Refill med when needed. Make sure bp is below 960  Systolic.

## 2013-12-17 ENCOUNTER — Telehealth: Payer: Self-pay | Admitting: Internal Medicine

## 2013-12-17 NOTE — Telephone Encounter (Signed)
Relevant patient education mailed to patient.  

## 2013-12-19 ENCOUNTER — Encounter (INDEPENDENT_AMBULATORY_CARE_PROVIDER_SITE_OTHER): Payer: Self-pay | Admitting: *Deleted

## 2014-02-24 ENCOUNTER — Other Ambulatory Visit (INDEPENDENT_AMBULATORY_CARE_PROVIDER_SITE_OTHER): Payer: Self-pay | Admitting: *Deleted

## 2014-02-24 DIAGNOSIS — Z1211 Encounter for screening for malignant neoplasm of colon: Secondary | ICD-10-CM

## 2014-03-30 ENCOUNTER — Telehealth (INDEPENDENT_AMBULATORY_CARE_PROVIDER_SITE_OTHER): Payer: Self-pay | Admitting: *Deleted

## 2014-03-30 DIAGNOSIS — Z1211 Encounter for screening for malignant neoplasm of colon: Secondary | ICD-10-CM

## 2014-03-30 NOTE — Telephone Encounter (Signed)
Patient needs movi prep 

## 2014-04-08 MED ORDER — PEG-KCL-NACL-NASULF-NA ASC-C 100 G PO SOLR: 1.0000 | Freq: Once | ORAL | Status: DC

## 2014-04-29 ENCOUNTER — Telehealth (INDEPENDENT_AMBULATORY_CARE_PROVIDER_SITE_OTHER): Payer: Self-pay | Admitting: *Deleted

## 2014-04-29 NOTE — Telephone Encounter (Signed)
  Procedure: tcs  Reason/Indication:  screening  Has patient had this procedure before?  no  If so, when, by whom and where?    Is there a family history of colon cancer?  no  Who?  What age when diagnosed?    Is patient diabetic?   no      Does patient have prosthetic heart valve?  no  Do you have a pacemaker?  no  Has patient ever had endocarditis? no  Has patient had joint replacement within last 12 months?  no  Does patient tend to be constipated or take laxatives? no  Is patient on Coumadin, Plavix and/or Aspirin? no  Medications: diovan daily  Allergies: pcn  Medication Adjustment:   Procedure date & time: 05/27/14 at 1030

## 2014-04-30 NOTE — Telephone Encounter (Signed)
agree

## 2014-05-07 ENCOUNTER — Encounter (HOSPITAL_COMMUNITY): Payer: Self-pay | Admitting: Pharmacy Technician

## 2014-05-08 ENCOUNTER — Encounter: Payer: Self-pay | Admitting: Internal Medicine

## 2014-05-08 ENCOUNTER — Ambulatory Visit (INDEPENDENT_AMBULATORY_CARE_PROVIDER_SITE_OTHER): Payer: Medicare Other | Admitting: Internal Medicine

## 2014-05-08 VITALS — BP 136/82 | Temp 98.3°F | Ht 67.5 in | Wt 206.0 lb

## 2014-05-08 DIAGNOSIS — I10 Essential (primary) hypertension: Secondary | ICD-10-CM

## 2014-05-08 DIAGNOSIS — J039 Acute tonsillitis, unspecified: Secondary | ICD-10-CM | POA: Insufficient documentation

## 2014-05-08 MED ORDER — CEPHALEXIN 500 MG PO CAPS: 500.0000 mg | ORAL_CAPSULE | Freq: Three times a day (TID) | ORAL | Status: DC
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
## 2014-05-08 NOTE — Patient Instructions (Addendum)
This looks like a unilateral tonsillitis infection  . Will let you know when culture tests are back. Take antibiotic 3 x per day  . If bothers the stomach  Then can decrease to twice a day. Should improve in the next 2-5 days . Contact us if not getting better . In this time frame.  Gargles  Warm salt water . To help with pain also .

## 2014-05-10 LAB — CULTURE, GROUP A STREP: ORGANISM ID, BACTERIA: NORMAL

## 2014-05-27 ENCOUNTER — Ambulatory Visit (HOSPITAL_COMMUNITY): Admission: RE | Admit: 2014-05-27 | Payer: Medicare Other | Source: Ambulatory Visit | Admitting: Internal Medicine

## 2014-05-27 ENCOUNTER — Encounter (HOSPITAL_COMMUNITY): Admission: RE | Payer: Self-pay | Source: Ambulatory Visit

## 2014-05-27 SURGERY — COLONOSCOPY
Anesthesia: Moderate Sedation

## 2014-11-10 ENCOUNTER — Ambulatory Visit (INDEPENDENT_AMBULATORY_CARE_PROVIDER_SITE_OTHER): Payer: Medicare Other | Admitting: Internal Medicine

## 2014-11-10 VITALS — BP 140/86 | Temp 98.0°F | Ht 67.5 in | Wt 204.1 lb

## 2014-11-10 DIAGNOSIS — E291 Testicular hypofunction: Secondary | ICD-10-CM

## 2014-11-10 DIAGNOSIS — E781 Pure hyperglyceridemia: Secondary | ICD-10-CM

## 2014-11-10 DIAGNOSIS — R7301 Impaired fasting glucose: Secondary | ICD-10-CM

## 2014-11-10 DIAGNOSIS — R1030 Lower abdominal pain, unspecified: Secondary | ICD-10-CM

## 2014-11-10 DIAGNOSIS — I10 Essential (primary) hypertension: Secondary | ICD-10-CM

## 2014-11-10 DIAGNOSIS — Z125 Encounter for screening for malignant neoplasm of prostate: Secondary | ICD-10-CM

## 2014-11-10 LAB — CBC WITH DIFFERENTIAL/PLATELET
BASOS ABS: 0.1 10*3/uL (ref 0.0–0.1)
Basophils Relative: 0.8 % (ref 0.0–3.0)
EOS PCT: 4.3 % (ref 0.0–5.0)
Eosinophils Absolute: 0.3 10*3/uL (ref 0.0–0.7)
HCT: 42.8 % (ref 39.0–52.0)
HEMOGLOBIN: 14.8 g/dL (ref 13.0–17.0)
LYMPHS ABS: 2 10*3/uL (ref 0.7–4.0)
LYMPHS PCT: 30.7 % (ref 12.0–46.0)
MCHC: 34.7 g/dL (ref 30.0–36.0)
MCV: 83.8 fl (ref 78.0–100.0)
MONO ABS: 0.6 10*3/uL (ref 0.1–1.0)
MONOS PCT: 9 % (ref 3.0–12.0)
NEUTROS PCT: 55.2 % (ref 43.0–77.0)
Neutro Abs: 3.6 10*3/uL (ref 1.4–7.7)
Platelets: 270 10*3/uL (ref 150.0–400.0)
RBC: 5.1 Mil/uL (ref 4.22–5.81)
RDW: 12.5 % (ref 11.5–15.5)
WBC: 6.5 10*3/uL (ref 4.0–10.5)

## 2014-11-10 LAB — POCT URINALYSIS DIPSTICK
Bilirubin, UA: NEGATIVE
Glucose, UA: NEGATIVE
Ketones, UA: NEGATIVE
LEUKOCYTES UA: NEGATIVE
NITRITE UA: NEGATIVE
PROTEIN UA: NEGATIVE
RBC UA: NEGATIVE
Spec Grav, UA: 1.025
Urobilinogen, UA: 0.2
pH, UA: 6

## 2014-11-10 LAB — HEMOGLOBIN A1C: HEMOGLOBIN A1C: 5.9 % (ref 4.6–6.5)

## 2014-11-10 LAB — PSA: PSA: 1.13 ng/mL (ref 0.10–4.00)

## 2014-11-10 LAB — TSH: TSH: 2.09 u[IU]/mL (ref 0.35–4.50)
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
## 2014-11-10 NOTE — Patient Instructions (Signed)
Checking for UTI  Check up monitoring labs and diabetes screen labs today . Since  You are fasting Keep cpx appt in march   Cancel lab appt .  Fu call if  persistent or progressive area of discomfort seems to be lower bowel or  Bladder are .

## 2014-11-11 LAB — HEPATIC FUNCTION PANEL
ALK PHOS: 56 U/L (ref 39–117)
ALT: 18 U/L (ref 0–53)
AST: 18 U/L (ref 0–37)
Albumin: 4.1 g/dL (ref 3.5–5.2)
Bilirubin, Direct: 0.1 mg/dL (ref 0.0–0.3)
TOTAL PROTEIN: 7 g/dL (ref 6.0–8.3)
Total Bilirubin: 0.6 mg/dL (ref 0.2–1.2)

## 2014-11-11 LAB — BASIC METABOLIC PANEL
BUN: 16 mg/dL (ref 6–23)
CALCIUM: 10.2 mg/dL (ref 8.4–10.5)
CHLORIDE: 105 meq/L (ref 96–112)
CO2: 29 mEq/L (ref 19–32)
Creatinine, Ser: 0.88 mg/dL (ref 0.40–1.50)
GFR: 89.88 mL/min (ref >60.00)
Glucose, Bld: 102 mg/dL — ABNORMAL HIGH (ref 70–99)
Potassium: 4.5 mEq/L (ref 3.5–5.1)
Sodium: 140 mEq/L (ref 135–145)

## 2014-11-11 LAB — LIPID PANEL
Cholesterol: 153 mg/dL (ref 0–200)
HDL: 35.7 mg/dL — ABNORMAL LOW (ref >39.00)
NonHDL: 117.3
Total CHOL/HDL Ratio: 4
Triglycerides: 221 mg/dL — ABNORMAL HIGH (ref 0.0–149.0)
VLDL: 44.2 mg/dL — AB (ref 0.0–40.0)

## 2014-11-11 LAB — C-REACTIVE PROTEIN: CRP: 1 mg/dL (ref 0.5–20.0)

## 2014-11-11 LAB — LDL CHOLESTEROL, DIRECT: Direct LDL: 89 mg/dL

## 2014-11-12 ENCOUNTER — Encounter: Payer: Self-pay | Admitting: Internal Medicine

## 2014-11-12 DIAGNOSIS — I10 Essential (primary) hypertension: Secondary | ICD-10-CM | POA: Insufficient documentation

## 2014-11-12 DIAGNOSIS — R7301 Impaired fasting glucose: Secondary | ICD-10-CM | POA: Insufficient documentation

## 2014-11-12 DIAGNOSIS — R1030 Lower abdominal pain, unspecified: Secondary | ICD-10-CM | POA: Insufficient documentation

## 2014-11-12 DIAGNOSIS — R739 Hyperglycemia, unspecified: Secondary | ICD-10-CM | POA: Insufficient documentation

## 2014-12-05 ENCOUNTER — Other Ambulatory Visit: Payer: Self-pay | Admitting: Internal Medicine

## 2014-12-07 NOTE — Telephone Encounter (Signed)
Pt has wellness exam scheduled for 12/21/14

## 2014-12-21 ENCOUNTER — Encounter: Payer: Self-pay | Admitting: Internal Medicine

## 2014-12-21 ENCOUNTER — Ambulatory Visit (INDEPENDENT_AMBULATORY_CARE_PROVIDER_SITE_OTHER): Payer: Medicare Other | Admitting: Internal Medicine

## 2014-12-21 VITALS — BP 126/70 | Temp 98.0°F | Ht 67.75 in | Wt 202.0 lb

## 2014-12-21 DIAGNOSIS — E781 Pure hyperglyceridemia: Secondary | ICD-10-CM

## 2014-12-21 DIAGNOSIS — Z2821 Immunization not carried out because of patient refusal: Secondary | ICD-10-CM

## 2014-12-21 DIAGNOSIS — I10 Essential (primary) hypertension: Secondary | ICD-10-CM

## 2014-12-21 DIAGNOSIS — Z23 Encounter for immunization: Secondary | ICD-10-CM

## 2014-12-21 DIAGNOSIS — R7301 Impaired fasting glucose: Secondary | ICD-10-CM

## 2014-12-21 DIAGNOSIS — Z0000 Encounter for general adult medical examination without abnormal findings: Secondary | ICD-10-CM
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
## 2014-12-21 NOTE — Patient Instructions (Addendum)
Continue lifestyle intervention healthy eating and exercise . Yearly flu vaccine Continue same blood pressure control.   Healthy lifestyle includes : At least 150 minutes of exercise weeks  , weight at healthy levels, which is usually   BMI 19-25. Avoid trans fats and processed foods;  Increase fresh fruits and veges to 5 servings per day. And avoid sweet beverages including tea and juice. Mediterranean diet with olive oil and nuts have been noted to be heart and brain healthy . Avoid tobacco products . Limit  alcohol to  7 per week for women and 14 servings for men.  Get adequate sleep . Wear seat belts . Don't text and drive .    Lab Results  Component Value Date   WBC 6.5 11/10/2014   HGB 14.8 11/10/2014   HCT 42.8 11/10/2014   PLT 270.0 11/10/2014   GLUCOSE 102* 11/10/2014   CHOL 153 11/10/2014   TRIG 221.0* 11/10/2014   HDL 35.70* 11/10/2014   LDLDIRECT 89.0 11/10/2014   LDLCALC 105* 12/16/2013   ALT 18 11/10/2014   AST 18 11/10/2014   NA 140 11/10/2014   K 4.5 11/10/2014   CL 105 11/10/2014   CREATININE 0.88 11/10/2014   BUN 16 11/10/2014   CO2 29 11/10/2014   TSH 2.09 11/10/2014   PSA 1.13 11/10/2014   HGBA1C 5.9 11/10/2014

## 2014-12-31 ENCOUNTER — Encounter: Payer: Self-pay | Admitting: Internal Medicine

## 2015-02-03 ENCOUNTER — Other Ambulatory Visit: Payer: Self-pay | Admitting: Internal Medicine

## 2015-02-03 NOTE — Telephone Encounter (Signed)
Sent to the pharmacy by e-scribe. 

## 2015-04-13 ENCOUNTER — Telehealth: Payer: Self-pay | Admitting: Internal Medicine

## 2015-04-13 ENCOUNTER — Ambulatory Visit (INDEPENDENT_AMBULATORY_CARE_PROVIDER_SITE_OTHER): Payer: Medicare Other | Admitting: Internal Medicine

## 2015-04-13 ENCOUNTER — Encounter: Payer: Self-pay | Admitting: Internal Medicine

## 2015-04-13 VITALS — BP 124/80 | Temp 98.0°F | Ht 67.75 in | Wt 203.9 lb

## 2015-04-13 DIAGNOSIS — H938X3 Other specified disorders of ear, bilateral: Secondary | ICD-10-CM

## 2015-04-13 DIAGNOSIS — R42 Dizziness and giddiness: Secondary | ICD-10-CM

## 2015-04-13 DIAGNOSIS — I10 Essential (primary) hypertension: Secondary | ICD-10-CM | POA: Diagnosis not present

## 2015-04-13 NOTE — Telephone Encounter (Signed)
Patient Name: Bryan Cabrera DOB: 26-Mar-1941 Initial Comment Caller states felt very queezy yesterday at work, started vomiting, ears popping Nurse Assessment Nurse: Ronnald Ramp, RN, Miranda Date/Time (Eastern Time): 04/13/2015 10:20:30 AM Confirm and document reason for call. If symptomatic, describe symptoms. 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 His ears popped. Then he started vomiting. He wants to know if getting over heated could cause the eustastian tubes to pop and make him sick. He is not having any symptoms today. He is concerned because he is supposed to fly in 30 days. Also he has tick bites the last couple of weeks. Has the patient traveled out of the country within the last 30 days? ---Not Applicable Does the patient require triage? ---Yes Related visit to physician within the last 2 weeks? ---No Does the PT have any chronic conditions? (i.e. diabetes, asthma, etc.) ---Yes List chronic conditions. ---HTN Guidelines Guideline Title Affirmed Question Affirmed Notes Tick Bite [1] Probable deer tick AND [2] attached > 24 hours (or tick appears swollen, not flat) AND [3] occurred in an area where Lyme disease is common Final Disposition User Call PCP within 24 Hours Ronnald Ramp, RN, Miranda Comments Appt scheduled for 4:00pm today with Dr. Regis Bill and callers request.

## 2015-04-13 NOTE — Patient Instructions (Signed)
Sounds like a vertigo episode  And could be inner ear with the ear popping  Also could have had  Blood pressure fluctuations because of the up nad down of the work.  ekg one change but dont think related to your episode sinus rhythym.  woul like  You to see ent to evauate the sx . As discussed .  bp is good .
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
## 2015-05-12 ENCOUNTER — Encounter: Payer: Self-pay | Admitting: Internal Medicine

## 2015-05-12 ENCOUNTER — Ambulatory Visit (INDEPENDENT_AMBULATORY_CARE_PROVIDER_SITE_OTHER): Payer: Medicare Other | Admitting: Internal Medicine

## 2015-05-12 ENCOUNTER — Other Ambulatory Visit: Payer: Self-pay | Admitting: Family Medicine

## 2015-05-12 ENCOUNTER — Ambulatory Visit (INDEPENDENT_AMBULATORY_CARE_PROVIDER_SITE_OTHER)
Admission: RE | Admit: 2015-05-12 | Discharge: 2015-05-12 | Disposition: A | Discharge status: Home / Self Care | Payer: Medicare Other | Source: Ambulatory Visit | Attending: Internal Medicine | Admitting: Internal Medicine

## 2015-05-12 VITALS — BP 148/78 | Temp 98.2°F | Ht 67.75 in | Wt 205.0 lb

## 2015-05-12 DIAGNOSIS — I10 Essential (primary) hypertension: Secondary | ICD-10-CM | POA: Diagnosis not present

## 2015-05-12 DIAGNOSIS — R05 Cough: Secondary | ICD-10-CM

## 2015-05-12 DIAGNOSIS — R42 Dizziness and giddiness: Secondary | ICD-10-CM

## 2015-05-12 DIAGNOSIS — R112 Nausea with vomiting, unspecified: Secondary | ICD-10-CM

## 2015-05-12 DIAGNOSIS — R059 Cough, unspecified: Secondary | ICD-10-CM

## 2015-05-12 LAB — CBC WITH DIFFERENTIAL/PLATELET
Basophils Absolute: 0 10*3/uL (ref 0.0–0.1)
Basophils Relative: 0.6 % (ref 0.0–3.0)
Eosinophils Absolute: 0.2 10*3/uL (ref 0.0–0.7)
Eosinophils Relative: 3.1 % (ref 0.0–5.0)
HEMATOCRIT: 44.8 % (ref 39.0–52.0)
HEMOGLOBIN: 15.2 g/dL (ref 13.0–17.0)
LYMPHS PCT: 23.2 % (ref 12.0–46.0)
Lymphs Abs: 1.5 10*3/uL (ref 0.7–4.0)
MCHC: 34 g/dL (ref 30.0–36.0)
MCV: 84.9 fl (ref 78.0–100.0)
Monocytes Absolute: 0.8 10*3/uL (ref 0.1–1.0)
Monocytes Relative: 12 % (ref 3.0–12.0)
Neutro Abs: 4 10*3/uL (ref 1.4–7.7)
Neutrophils Relative %: 61.1 % (ref 43.0–77.0)
Platelets: 207 10*3/uL (ref 150.0–400.0)
RBC: 5.28 Mil/uL (ref 4.22–5.81)
RDW: 13.2 % (ref 11.5–15.5)
WBC: 6.6 10*3/uL (ref 4.0–10.5)

## 2015-05-12 LAB — HEPATIC FUNCTION PANEL
ALT: 19 U/L (ref 0–53)
AST: 18 U/L (ref 0–37)
Albumin: 4.4 g/dL (ref 3.5–5.2)
Alkaline Phosphatase: 58 U/L (ref 39–117)
BILIRUBIN TOTAL: 0.6 mg/dL (ref 0.2–1.2)
Bilirubin, Direct: 0.1 mg/dL (ref 0.0–0.3)
Total Protein: 7.6 g/dL (ref 6.0–8.3)

## 2015-05-12 LAB — SEDIMENTATION RATE: SED RATE: 7 mm/h (ref 0–22)

## 2015-05-12 LAB — BASIC METABOLIC PANEL
BUN: 16 mg/dL (ref 6–23)
CO2: 31 meq/L (ref 19–32)
Calcium: 10.5 mg/dL (ref 8.4–10.5)
Chloride: 104 mEq/L (ref 96–112)
Creatinine, Ser: 1.06 mg/dL (ref 0.40–1.50)
GFR: 72.41 mL/min (ref >60.00)
Glucose, Bld: 105 mg/dL — ABNORMAL HIGH (ref 70–99)
Potassium: 4.7 mEq/L (ref 3.5–5.1)
Sodium: 140 mEq/L (ref 135–145)

## 2015-05-12 LAB — TSH: TSH: 1.71 u[IU]/mL (ref 0.35–4.50)

## 2015-05-12 MED ORDER — AZITHROMYCIN 250 MG PO TABS: ORAL_TABLET | ORAL | Status: DC
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
## 2015-05-12 NOTE — Patient Instructions (Addendum)
This may be  Early respiratory infection  Viral more likely than bacterial however  concern about the dizziness and nausea  Persisting.  Checking  Lab for metabolic   Problem causing sx .  Head ct scan   .  Apologies about not getting  Any communication about ent appt.   Will decide on rx after getting   Information back  .   Sometimes use  antivert  Meclizine for vertigo sx .

## 2015-08-15 ENCOUNTER — Encounter (HOSPITAL_COMMUNITY): Payer: Self-pay | Admitting: Emergency Medicine

## 2015-08-15 ENCOUNTER — Emergency Department (HOSPITAL_COMMUNITY)
Admission: EM | Admit: 2015-08-15 | Discharge: 2015-08-16 | Disposition: A | Discharge status: Home / Self Care | Payer: Medicare Other | Attending: Emergency Medicine | Admitting: Emergency Medicine

## 2015-08-15 DIAGNOSIS — Z87891 Personal history of nicotine dependence: Secondary | ICD-10-CM | POA: Insufficient documentation

## 2015-08-15 DIAGNOSIS — E781 Pure hyperglyceridemia: Secondary | ICD-10-CM | POA: Diagnosis not present

## 2015-08-15 DIAGNOSIS — Z880 Allergy status to penicillin: Secondary | ICD-10-CM | POA: Diagnosis not present

## 2015-08-15 DIAGNOSIS — M1990 Unspecified osteoarthritis, unspecified site: Secondary | ICD-10-CM | POA: Diagnosis not present

## 2015-08-15 DIAGNOSIS — R112 Nausea with vomiting, unspecified: Secondary | ICD-10-CM | POA: Diagnosis not present

## 2015-08-15 DIAGNOSIS — Z7982 Long term (current) use of aspirin: Secondary | ICD-10-CM | POA: Diagnosis not present

## 2015-08-15 DIAGNOSIS — I10 Essential (primary) hypertension: Secondary | ICD-10-CM | POA: Insufficient documentation

## 2015-08-15 DIAGNOSIS — R509 Fever, unspecified: Secondary | ICD-10-CM | POA: Diagnosis not present

## 2015-08-15 DIAGNOSIS — Z8639 Personal history of other endocrine, nutritional and metabolic disease: Secondary | ICD-10-CM | POA: Diagnosis not present

## 2015-08-15 DIAGNOSIS — R42 Dizziness and giddiness: Secondary | ICD-10-CM | POA: Insufficient documentation

## 2015-08-15 DIAGNOSIS — Z87828 Personal history of other (healed) physical injury and trauma: Secondary | ICD-10-CM | POA: Diagnosis not present

## 2015-08-15 DIAGNOSIS — Z8669 Personal history of other diseases of the nervous system and sense organs: Secondary | ICD-10-CM | POA: Insufficient documentation

## 2015-08-15 LAB — CBC
HEMATOCRIT: 42.2 % (ref 39.0–52.0)
Hemoglobin: 14.6 g/dL (ref 13.0–17.0)
MCH: 29.3 pg (ref 26.0–34.0)
MCHC: 34.6 g/dL (ref 30.0–36.0)
MCV: 84.6 fL (ref 78.0–100.0)
PLATELETS: 159 10*3/uL (ref 150–400)
RBC: 4.99 MIL/uL (ref 4.22–5.81)
RDW: 12.7 % (ref 11.5–15.5)
WBC: 8.7 10*3/uL (ref 4.0–10.5)

## 2015-08-15 LAB — BASIC METABOLIC PANEL
Anion gap: 10 (ref 5–15)
BUN: 17 mg/dL (ref 6–20)
CHLORIDE: 106 mmol/L (ref 101–111)
CO2: 22 mmol/L (ref 22–32)
CREATININE: 0.91 mg/dL (ref 0.61–1.24)
Calcium: 9.4 mg/dL (ref 8.9–10.3)
GFR calc Af Amer: >60 mL/min (ref >60)
Glucose, Bld: 134 mg/dL — ABNORMAL HIGH (ref 65–99)
Potassium: 3.8 mmol/L (ref 3.5–5.1)
SODIUM: 138 mmol/L (ref 135–145)

## 2015-08-15 LAB — CBG MONITORING, ED: Glucose-Capillary: 128 mg/dL — ABNORMAL HIGH (ref 65–99)

## 2015-08-15 LAB — TROPONIN I: Troponin I: <0.03 ng/mL (ref <0.031)

## 2015-08-15 MED ORDER — SODIUM CHLORIDE 0.9 % IV BOLUS (SEPSIS)
500.0000 mL | Freq: Once | INTRAVENOUS | Status: AC
  Administered 2015-08-15: 500 mL via INTRAVENOUS

## 2015-08-15 MED ORDER — MECLIZINE HCL 12.5 MG PO TABS: 12.5000 mg | ORAL_TABLET | Freq: Three times a day (TID) | ORAL | Status: DC | PRN

## 2015-08-15 MED ORDER — MECLIZINE HCL 12.5 MG PO TABS
12.5000 mg | ORAL_TABLET | Freq: Once | ORAL | Status: AC
  Administered 2015-08-15: 12.5 mg via ORAL
  Filled 2015-08-15: qty 1

## 2015-08-15 NOTE — ED Notes (Signed)
Pt again says unable to give urine sample at this time

## 2015-08-15 NOTE — ED Notes (Signed)
Pt has hx of vertigo 5 yers ago, with dizziness and vomiting. Today states he felt "sick this morning" then got up and felt dizzy tonight and began vomiting with dizziness.

## 2015-08-15 NOTE — ED Notes (Signed)
EMS gave 4 of zofran & 565ml of normal salin.
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
## 2015-08-15 NOTE — Discharge Instructions (Signed)
See your physician or return to the ER if symptoms return and persist. Discuss MRI of the brain with a primary doctor.  If you were given medicines take as directed.  If you are on coumadin or contraceptives realize their levels and effectiveness is altered by many different medicines.  If you have any reaction (rash, tongues swelling, other) to the medicines stop taking and see a physician.    If your blood pressure was elevated in the ER make sure you follow up for management with a primary doctor or return for chest pain, shortness of breath or stroke symptoms.  Please follow up as directed and return to the ER or see a physician for new or worsening symptoms.  Thank you. Filed Vitals:   08/15/15 2134  BP: 161/61  Pulse: 18  Temp: 98.4 F (36.9 C)  TempSrc: Oral  Resp: 18  Height: 5' 8.5" (1.74 m)  Weight: 202 lb (91.627 kg)  SpO2: 97%

## 2015-08-16 LAB — URINALYSIS, ROUTINE W REFLEX MICROSCOPIC
Bilirubin Urine: NEGATIVE
Glucose, UA: NEGATIVE mg/dL
Ketones, ur: 15 mg/dL — AB
Leukocytes, UA: NEGATIVE
NITRITE: NEGATIVE
PH: 5.5 (ref 5.0–8.0)
Protein, ur: NEGATIVE mg/dL
SPECIFIC GRAVITY, URINE: 1.025 (ref 1.005–1.030)
UROBILINOGEN UA: 0.2 mg/dL (ref 0.0–1.0)

## 2015-08-16 LAB — URINE MICROSCOPIC-ADD ON

## 2015-08-16 NOTE — ED Notes (Signed)
Pt alert & oriented x4, stable gait. Patient  given discharge instructions, paperwork & prescription(s). Patient  verbalized understanding. Pt left department in wheelchair escorted by staff. Pt had  no further questions.

## 2015-08-17 ENCOUNTER — Telehealth: Payer: Self-pay | Admitting: Internal Medicine

## 2015-08-17 NOTE — Telephone Encounter (Signed)
Pt has been scheduled for noon on wed

## 2015-08-17 NOTE — Telephone Encounter (Addendum)
Pt was in ED  Sunday night for vertigo, vomiting , cold sweat, fever, and pt collapsed in his bathroom. Wife called EMS. Dr at [facility] wants to do a MRI on Monday morning. They suggested pt may have had stroke. But no one called him to schedule,  Pt finally got a call from [facility] and they told him they are trying to clear MRI with insurance, but in the meantime see Dr Regis Bill.  Pt instructed to follow up with PCP in 2 days . That was Sunday.  No 30 min appts til Friday. Please advise.

## 2015-08-17 NOTE — Telephone Encounter (Signed)
He needs to have the mri  Can use the noon appt tomorrow  If needed .  That is a 30 minute appt

## 2015-08-18 ENCOUNTER — Ambulatory Visit (INDEPENDENT_AMBULATORY_CARE_PROVIDER_SITE_OTHER): Payer: Medicare Other | Admitting: Internal Medicine

## 2015-08-18 ENCOUNTER — Encounter: Payer: Self-pay | Admitting: Internal Medicine

## 2015-08-18 ENCOUNTER — Telehealth: Payer: Self-pay | Admitting: Internal Medicine

## 2015-08-18 VITALS — BP 140/84 | Temp 97.9°F | Ht 68.5 in | Wt 206.4 lb

## 2015-08-18 DIAGNOSIS — R0789 Other chest pain: Secondary | ICD-10-CM

## 2015-08-18 DIAGNOSIS — R112 Nausea with vomiting, unspecified: Secondary | ICD-10-CM

## 2015-08-18 DIAGNOSIS — R42 Dizziness and giddiness: Secondary | ICD-10-CM | POA: Diagnosis not present

## 2015-08-18 NOTE — Telephone Encounter (Signed)
Pt is scheduled for an MRI at [facility] on 11/21. Dr Regis Bill would like this moved up to Thursday or Friday this week. Can be done at [facility] still or patient is willing to come to [place]. Pre cert has been done for [employer] though.

## 2015-08-18 NOTE — Patient Instructions (Addendum)
We will work on getting MRI   of brain  asap and get neuro to see you but  Your exam is reassuring today . Taking 81 mg of asa .once a day .  Then take pepcid  20 mg  Once a day  OR  ranitidine  150 twice a day .  Contact us if  Returning symptoms  In the interim
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
## 2015-08-19 ENCOUNTER — Telehealth: Payer: Self-pay

## 2015-08-19 NOTE — Telephone Encounter (Addendum)
PT  Was referred to [place] from [place] at [place]. Looks like pt is having some problems  I sent a letter for him to call and also [facility] for a return call.

## 2015-08-26 ENCOUNTER — Telehealth: Payer: Self-pay

## 2015-08-26 NOTE — Telephone Encounter (Signed)
I talked with the pt. He has never had a colonoscopy. He is not having any GI issues. But he recently had some problems and went to the ED. Is having an MRI of the brain in a few days. Scheduled an office visit to discuss his present health issues prior to scheduling a colonoscopy. He would like to have Dr. Gala Romney do his colonoscopy ( he knows Dr. Gala Romney and has done some work for him before).

## 2015-08-26 NOTE — Telephone Encounter (Signed)
Patient called to be triaged, he received a letter. He is getting ready to go out of town and wanted to schedule with DS ASAP. I told him that she was on the other line and would have to call him back. [id number]

## 2015-08-26 NOTE — Telephone Encounter (Signed)
OV with Neil Crouch, PA on 09/14/2015 at 9:30 Am.

## 2015-08-29 ENCOUNTER — Ambulatory Visit
Admission: RE | Admit: 2015-08-29 | Discharge: 2015-08-29 | Disposition: A | Discharge status: Home / Self Care | Payer: Medicare Other | Source: Ambulatory Visit | Attending: Emergency Medicine | Admitting: Emergency Medicine

## 2015-08-31 NOTE — Telephone Encounter (Signed)
Opened in error

## 2015-09-06 ENCOUNTER — Ambulatory Visit (HOSPITAL_COMMUNITY): Payer: Medicare Other

## 2015-09-14 ENCOUNTER — Ambulatory Visit: Payer: Medicare Other | Admitting: Gastroenterology

## 2015-09-28 ENCOUNTER — Other Ambulatory Visit: Payer: Self-pay

## 2015-09-28 ENCOUNTER — Encounter: Payer: Self-pay | Admitting: Gastroenterology

## 2015-09-28 ENCOUNTER — Ambulatory Visit (INDEPENDENT_AMBULATORY_CARE_PROVIDER_SITE_OTHER): Payer: Medicare Other | Admitting: Gastroenterology

## 2015-09-28 VITALS — BP 161/85 | HR 83 | Temp 97.9°F | Ht 68.5 in | Wt 207.0 lb

## 2015-09-28 DIAGNOSIS — R1013 Epigastric pain: Secondary | ICD-10-CM | POA: Insufficient documentation

## 2015-09-28 DIAGNOSIS — Z1211 Encounter for screening for malignant neoplasm of colon: Secondary | ICD-10-CM | POA: Diagnosis not present

## 2015-09-28 DIAGNOSIS — K219 Gastro-esophageal reflux disease without esophagitis: Secondary | ICD-10-CM

## 2015-09-28 MED ORDER — PEG 3350-KCL-NA BICARB-NACL 420 G PO SOLR: 4000.0000 mL | Freq: Once | ORAL | Status: DC

## 2015-09-28 NOTE — Patient Instructions (Signed)
1. Colonoscopy and upper endoscopy with Dr. Gala Romney. See separate instructions.  2. We will request records from Dr. Saul Fordyce office.
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
## 2015-09-29 ENCOUNTER — Encounter: Payer: Self-pay | Admitting: Neurology

## 2015-09-29 ENCOUNTER — Ambulatory Visit (INDEPENDENT_AMBULATORY_CARE_PROVIDER_SITE_OTHER): Payer: Medicare Other | Admitting: Neurology

## 2015-09-29 VITALS — BP 142/88 | HR 73 | Ht 68.5 in | Wt 209.0 lb

## 2015-09-29 DIAGNOSIS — R42 Dizziness and giddiness: Secondary | ICD-10-CM | POA: Diagnosis not present

## 2015-09-29 DIAGNOSIS — I10 Essential (primary) hypertension: Secondary | ICD-10-CM | POA: Diagnosis not present

## 2015-09-29 NOTE — Patient Instructions (Signed)
I don't think the dizzy spells are stroke related.  Consider seeing ear nose and throat physician
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
## 2015-10-05 NOTE — Progress Notes (Signed)
CC'ED TO PCP 

## 2015-10-05 NOTE — Assessment & Plan Note (Signed)
Baseline mild GERD, couple of episodes of more significant discomfort earlier this year. Patient is requesting upper endoscopy which is reasonable for recent onset gerd/dyspepsia after age 74.  I have discussed the risks, alternatives, benefits with regards to but not limited to the risk of reaction to medication, bleeding, infection, perforation and the patient is agreeable to proceed. Written consent to be obtained.

## 2015-10-05 NOTE — Progress Notes (Signed)
Reviewed sedation records from dental procedure. 08/01/2013: Received fentanyl 0.10 mg, Versed 10 mg 03/25/2014: Received fentanyl 0.10 mg, Versed 10 mg, Decadron 8mg   Patient states he had less prolonged sedation after the second procedure even though received same dose.

## 2015-10-05 NOTE — Assessment & Plan Note (Addendum)
74 year old gentleman with no prior colonoscopy who presents for screening colonoscopy at this time. Complains of prolonged period of sedation after receiving Versed for dental surgery previously. We have requested those records, update to follow.  I have discussed the risks, alternatives, benefits with regards to but not limited to the risk of reaction to medication, bleeding, infection, perforation and the patient is agreeable to proceed. Written consent to be obtained.

## 2015-10-19 ENCOUNTER — Telehealth: Payer: Self-pay | Admitting: Internal Medicine

## 2015-10-19 NOTE — Telephone Encounter (Signed)
Pt is having cataract surgery and wants to cancel his procedure with [facility] on 1/9. He will call later on to reschedule.

## 2015-10-19 NOTE — Telephone Encounter (Signed)
Noted and pt has been taken off the schedule 

## 2015-10-25 ENCOUNTER — Ambulatory Visit: Admit: 2015-10-25 | Payer: Medicare Other | Admitting: Internal Medicine

## 2015-10-25 SURGERY — COLONOSCOPY
Anesthesia: Moderate Sedation

## 2015-11-18 ENCOUNTER — Telehealth: Payer: Self-pay | Admitting: Internal Medicine

## 2015-11-18 DIAGNOSIS — J310 Chronic rhinitis: Secondary | ICD-10-CM

## 2015-11-18 NOTE — Telephone Encounter (Signed)
Pt call to ask if Dr Regis Bill is still going to refr him to a Ear nose and throat . He said he has seen Dr [name] on [facility]    He siad he is still having nasal issues

## 2015-11-19 NOTE — Telephone Encounter (Signed)
Referral placed in the system. 

## 2015-11-19 NOTE — Telephone Encounter (Signed)
Please refer to dr [name]

## 2015-12-08 ENCOUNTER — Encounter (HOSPITAL_COMMUNITY): Payer: Self-pay | Admitting: Emergency Medicine

## 2015-12-08 ENCOUNTER — Emergency Department (HOSPITAL_COMMUNITY)
Admission: EM | Admit: 2015-12-08 | Discharge: 2015-12-09 | Disposition: A | Discharge status: Home / Self Care | Payer: Medicare Other | Attending: Emergency Medicine | Admitting: Emergency Medicine

## 2015-12-08 ENCOUNTER — Emergency Department (HOSPITAL_COMMUNITY): Payer: Medicare Other

## 2015-12-08 DIAGNOSIS — I10 Essential (primary) hypertension: Secondary | ICD-10-CM | POA: Insufficient documentation

## 2015-12-08 DIAGNOSIS — R109 Unspecified abdominal pain: Secondary | ICD-10-CM | POA: Diagnosis present

## 2015-12-08 DIAGNOSIS — Z8669 Personal history of other diseases of the nervous system and sense organs: Secondary | ICD-10-CM | POA: Insufficient documentation

## 2015-12-08 DIAGNOSIS — I159 Secondary hypertension, unspecified: Secondary | ICD-10-CM | POA: Diagnosis not present

## 2015-12-08 DIAGNOSIS — Z87891 Personal history of nicotine dependence: Secondary | ICD-10-CM | POA: Diagnosis not present

## 2015-12-08 DIAGNOSIS — Z880 Allergy status to penicillin: Secondary | ICD-10-CM | POA: Diagnosis not present

## 2015-12-08 DIAGNOSIS — K2970 Gastritis, unspecified, without bleeding: Secondary | ICD-10-CM | POA: Diagnosis not present

## 2015-12-08 DIAGNOSIS — Z79899 Other long term (current) drug therapy: Secondary | ICD-10-CM | POA: Diagnosis not present

## 2015-12-08 DIAGNOSIS — Z87828 Personal history of other (healed) physical injury and trauma: Secondary | ICD-10-CM | POA: Diagnosis not present

## 2015-12-08 DIAGNOSIS — Z8739 Personal history of other diseases of the musculoskeletal system and connective tissue: Secondary | ICD-10-CM | POA: Diagnosis not present

## 2015-12-08 DIAGNOSIS — Z8639 Personal history of other endocrine, nutritional and metabolic disease: Secondary | ICD-10-CM | POA: Diagnosis not present

## 2015-12-08 LAB — CBC WITH DIFFERENTIAL/PLATELET
BASOS ABS: 0 10*3/uL (ref 0.0–0.1)
BASOS PCT: 1 %
Eosinophils Absolute: 0.3 10*3/uL (ref 0.0–0.7)
Eosinophils Relative: 5 %
HEMATOCRIT: 44.8 % (ref 39.0–52.0)
HEMOGLOBIN: 15.9 g/dL (ref 13.0–17.0)
Lymphocytes Relative: 36 %
Lymphs Abs: 2.4 10*3/uL (ref 0.7–4.0)
MCH: 29.8 pg (ref 26.0–34.0)
MCHC: 35.5 g/dL (ref 30.0–36.0)
MCV: 83.9 fL (ref 78.0–100.0)
Monocytes Absolute: 0.4 10*3/uL (ref 0.1–1.0)
Monocytes Relative: 5 %
NEUTROS ABS: 3.5 10*3/uL (ref 1.7–7.7)
NEUTROS PCT: 53 %
Platelets: 190 10*3/uL (ref 150–400)
RBC: 5.34 MIL/uL (ref 4.22–5.81)
RDW: 12.9 % (ref 11.5–15.5)
WBC: 6.6 10*3/uL (ref 4.0–10.5)

## 2015-12-08 MED ORDER — ONDANSETRON HCL 4 MG/2ML IJ SOLN
4.0000 mg | Freq: Once | INTRAMUSCULAR | Status: AC
  Administered 2015-12-08: 4 mg via INTRAVENOUS
  Filled 2015-12-08: qty 2

## 2015-12-08 MED ORDER — PANTOPRAZOLE SODIUM 40 MG IV SOLR
40.0000 mg | Freq: Once | INTRAVENOUS | Status: AC
  Administered 2015-12-08: 40 mg via INTRAVENOUS
  Filled 2015-12-08: qty 40

## 2015-12-08 MED ORDER — SODIUM CHLORIDE 0.9 % IV SOLN
INTRAVENOUS | Status: DC
  Administered 2015-12-08: 23:00:00 via INTRAVENOUS

## 2015-12-08 MED ORDER — MORPHINE SULFATE (PF) 4 MG/ML IV SOLN
4.0000 mg | Freq: Once | INTRAVENOUS | Status: AC
  Administered 2015-12-08: 4 mg via INTRAVENOUS
  Filled 2015-12-08: qty 1

## 2015-12-08 NOTE — ED Notes (Signed)
Pt c/o severe abd pain since eating hamburger tonight with nausea and arm pain.
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
## 2015-12-09 ENCOUNTER — Ambulatory Visit (HOSPITAL_COMMUNITY)
Admission: RE | Admit: 2015-12-09 | Discharge: 2015-12-09 | Disposition: A | Discharge status: Home / Self Care | Payer: Medicare Other | Source: Ambulatory Visit | Attending: Emergency Medicine | Admitting: Emergency Medicine

## 2015-12-09 DIAGNOSIS — R1011 Right upper quadrant pain: Secondary | ICD-10-CM | POA: Insufficient documentation

## 2015-12-09 DIAGNOSIS — R93422 Abnormal radiologic findings on diagnostic imaging of left kidney: Secondary | ICD-10-CM | POA: Insufficient documentation

## 2015-12-09 LAB — COMPREHENSIVE METABOLIC PANEL
ALT: 26 U/L (ref 17–63)
ANION GAP: 9 (ref 5–15)
AST: 24 U/L (ref 15–41)
Albumin: 4.6 g/dL (ref 3.5–5.0)
Alkaline Phosphatase: 53 U/L (ref 38–126)
BUN: 18 mg/dL (ref 6–20)
CHLORIDE: 106 mmol/L (ref 101–111)
CO2: 25 mmol/L (ref 22–32)
Calcium: 10 mg/dL (ref 8.9–10.3)
Creatinine, Ser: 1.01 mg/dL (ref 0.61–1.24)
Glucose, Bld: 159 mg/dL — ABNORMAL HIGH (ref 65–99)
POTASSIUM: 3.5 mmol/L (ref 3.5–5.1)
Sodium: 140 mmol/L (ref 135–145)
Total Bilirubin: 0.4 mg/dL (ref 0.3–1.2)
Total Protein: 7.8 g/dL (ref 6.5–8.1)

## 2015-12-09 LAB — TROPONIN I

## 2015-12-09 LAB — URINALYSIS, ROUTINE W REFLEX MICROSCOPIC
Bilirubin Urine: NEGATIVE
Glucose, UA: 100 mg/dL — AB
KETONES UR: NEGATIVE mg/dL
LEUKOCYTES UA: NEGATIVE
NITRITE: NEGATIVE
PROTEIN: NEGATIVE mg/dL
Specific Gravity, Urine: 1.015 (ref 1.005–1.030)
pH: 7.5 (ref 5.0–8.0)

## 2015-12-09 LAB — URINE MICROSCOPIC-ADD ON
Squamous Epithelial / LPF: NONE SEEN
WBC UA: NONE SEEN WBC/hpf (ref 0–5)

## 2015-12-09 LAB — LIPASE, BLOOD: LIPASE: 19 U/L (ref 11–51)

## 2015-12-09 MED ORDER — MORPHINE SULFATE (PF) 4 MG/ML IV SOLN
4.0000 mg | Freq: Once | INTRAVENOUS | Status: AC
Start: 2015-12-09 — End: 2015-12-09
  Administered 2015-12-09: 4 mg via INTRAVENOUS
  Filled 2015-12-09: qty 1

## 2015-12-09 MED ORDER — HYDROCHLOROTHIAZIDE 12.5 MG PO CAPS
12.5000 mg | ORAL_CAPSULE | Freq: Once | ORAL | Status: AC
  Administered 2015-12-09: 12.5 mg via ORAL

## 2015-12-09 MED ORDER — GI COCKTAIL ~~LOC~~
30.0000 mL | Freq: Once | ORAL | Status: AC
  Administered 2015-12-09: 30 mL via ORAL
  Filled 2015-12-09: qty 30

## 2015-12-09 MED ORDER — MORPHINE SULFATE (PF) 4 MG/ML IV SOLN
4.0000 mg | Freq: Once | INTRAVENOUS | Status: AC
  Administered 2015-12-09: 4 mg via INTRAVENOUS
  Filled 2015-12-09: qty 1

## 2015-12-09 MED ORDER — IOHEXOL 300 MG/ML  SOLN
100.0000 mL | Freq: Once | INTRAMUSCULAR | Status: AC | PRN
  Administered 2015-12-09: 100 mL via INTRAVENOUS

## 2015-12-09 MED ORDER — PANTOPRAZOLE SODIUM 40 MG PO TBEC: 40.0000 mg | DELAYED_RELEASE_TABLET | Freq: Every day | ORAL | Status: DC

## 2015-12-09 MED ORDER — ONDANSETRON 4 MG PO TBDP
4.0000 mg | ORAL_TABLET | Freq: Three times a day (TID) | ORAL | Status: DC | PRN
Start: 2015-12-09 — End: 2016-05-01

## 2015-12-09 MED ORDER — ONDANSETRON 4 MG PO TBDP: 4.0000 mg | ORAL_TABLET | Freq: Three times a day (TID) | ORAL | Status: DC | PRN

## 2015-12-09 MED ORDER — ONDANSETRON 4 MG PO TBDP
ORAL_TABLET | ORAL | Status: AC
  Filled 2015-12-09: qty 6

## 2015-12-09 MED ORDER — OXYCODONE-ACETAMINOPHEN 5-325 MG PO TABS: 1.0000 | ORAL_TABLET | Freq: Four times a day (QID) | ORAL | Status: DC | PRN

## 2015-12-09 MED ORDER — IRBESARTAN 300 MG PO TABS
300.0000 mg | ORAL_TABLET | Freq: Every day | ORAL | Status: DC
  Administered 2015-12-09: 300 mg via ORAL

## 2015-12-09 NOTE — Discharge Instructions (Signed)
Your pain today may be from inflammation of your stomach, acid reflux or possible peptic ulcer. I recommend you follow-up with your gastroenterologist to schedule an endoscopy and eat a low-fat, bland diet. I made you avoid NSAIDs such as aspirin, Aleve, ibuprofen as well as alcohol. There is also possibility that you could have pain caused from her gallbladder. Please return for an ultrasound of your gallbladder later today. If it does appear that you have gallstones we have given you general surgery follow-up information to schedule an appointment.   They also saw a small lesion on your pancreas. This is a nonspecific finding and they recommend a nonemergent outpatient MRI when you're feeling better. This is something that your primary care physician or gastroenterologist can schedule.     Gastritis, Adult Gastritis is soreness and swelling (inflammation) of the lining of the stomach. Gastritis can develop as a sudden onset (acute) or long-term (chronic) condition. If gastritis is not treated, it can lead to stomach bleeding and ulcers. CAUSES  Gastritis occurs when the stomach lining is weak or damaged. Digestive juices from the stomach then inflame the weakened stomach lining. The stomach lining may be weak or damaged due to viral or bacterial infections. One common bacterial infection is the Helicobacter pylori infection. Gastritis can also result from excessive alcohol consumption, taking certain medicines, or having too much acid in the stomach.  SYMPTOMS  In some cases, there are no symptoms. When symptoms are present, they may include:  Pain or a burning sensation in the upper abdomen.  Nausea.  Vomiting.  An uncomfortable feeling of fullness after eating. DIAGNOSIS  Your caregiver may suspect you have gastritis based on your symptoms and a physical exam. To determine the cause of your gastritis, your caregiver may perform the following:  Blood or stool tests to check for the H  pylori bacterium.  Gastroscopy. A thin, flexible tube (endoscope) is passed down the esophagus and into the stomach. The endoscope has a light and camera on the end. Your caregiver uses the endoscope to view the inside of the stomach.  Taking a tissue sample (biopsy) from the stomach to examine under a microscope. TREATMENT  Depending on the cause of your gastritis, medicines may be prescribed. If you have a bacterial infection, such as an H pylori infection, antibiotics may be given. If your gastritis is caused by too much acid in the stomach, H2 blockers or antacids may be given. Your caregiver may recommend that you stop taking aspirin, ibuprofen, or other nonsteroidal anti-inflammatory drugs (NSAIDs). HOME CARE INSTRUCTIONS  Only take over-the-counter or prescription medicines as directed by your caregiver.  If you were given antibiotic medicines, take them as directed. Finish them even if you start to feel better.  Drink enough fluids to keep your urine clear or pale yellow.  Avoid foods and drinks that make your symptoms worse, such as:  Caffeine or alcoholic drinks.  Chocolate.  Peppermint or mint flavorings.  Garlic and onions.  Spicy foods.  Citrus fruits, such as oranges, lemons, or limes.  Tomato-based foods such as sauce, chili, salsa, and pizza.  Fried and fatty foods.  Eat small, frequent meals instead of large meals. SEEK IMMEDIATE MEDICAL CARE IF:   You have black or dark red stools.  You vomit blood or material that looks like coffee grounds.  You are unable to keep fluids down.  Your abdominal pain gets worse.  You have a fever.  You do not feel better after 1 week.  You have any other questions or concerns. MAKE SURE YOU:  Understand these instructions.  Will watch your condition.  Will get help right away if you are not doing well or get worse.   This information is not intended to replace advice given to you by your health care provider.  Make sure you discuss any questions you have with your health care provider.   Document Released: 09/26/2001 Document Revised: 04/02/2012 Document Reviewed: 11/15/2011 Elsevier Interactive Patient Education 2016 [place] for Gastroesophageal Reflux Disease, Adult When you have gastroesophageal reflux disease (GERD), the foods you eat and your eating habits are very important. Choosing the right foods can help ease the discomfort of GERD. WHAT GENERAL GUIDELINES DO I NEED TO FOLLOW?  Choose fruits, vegetables, whole grains, low-fat dairy products, and low-fat meat, fish, and poultry.  Limit fats such as oils, salad dressings, butter, nuts, and avocado.  Keep a food diary to identify foods that cause symptoms.  Avoid foods that cause reflux. These may be different for different people.  Eat frequent small meals instead of three large meals each day.  Eat your meals slowly, in a relaxed setting.  Limit fried foods.  Cook foods using methods other than frying.  Avoid drinking alcohol.  Avoid drinking large amounts of liquids with your meals.  Avoid bending over or lying down until 2-3 hours after eating. WHAT FOODS ARE NOT RECOMMENDED? The following are some foods and drinks that may worsen your symptoms: Vegetables Tomatoes. Tomato juice. Tomato and spaghetti sauce. Chili peppers. Onion and garlic. Horseradish. Fruits Oranges, grapefruit, and lemon (fruit and juice). Meats High-fat meats, fish, and poultry. This includes hot dogs, ribs, ham, sausage, salami, and bacon. Dairy Whole milk and chocolate milk. Sour cream. Cream. Butter. Ice cream. Cream cheese.  Beverages Coffee and tea, with or without caffeine. Carbonated beverages or energy drinks. Condiments Hot sauce. Barbecue sauce.  Sweets/Desserts Chocolate and cocoa. Donuts. Peppermint and spearmint. Fats and Oils High-fat foods, including [place] fries and potato chips. Other Vinegar. Strong  spices, such as black pepper, white pepper, red pepper, cayenne, curry powder, cloves, ginger, and chili powder. The items listed above may not be a complete list of foods and beverages to avoid. Contact your dietitian for more information.   This information is not intended to replace advice given to you by your health care provider. Make sure you discuss any questions you have with your health care provider.   Document Released: 10/02/2005 Document Revised: 10/23/2014 Document Reviewed: 08/06/2013 Elsevier Interactive Patient Education 2016 [employer] Inc[employer]   Possible Biliary Colic Biliary colic is a pain in the upper abdomen. The pain:  Is usually felt on the right side of the abdomen, but it may also be felt in the center of the abdomen, just below the breastbone (sternum).  May spread back toward the right shoulder blade.  May be steady or irregular.  May be accompanied by nausea and vomiting. Most of the time, the pain goes away in 1-5 hours. After the most intense pain passes, the abdomen may continue to ache mildly for about 24 hours. Biliary colic is caused by a blockage in the bile duct. The bile duct is a pathway that carries bile--a liquid that helps to digest fats--from the gallbladder to the small intestine. Biliary colic usually occurs after eating, when the digestive system demands bile. The pain develops when muscle cells contract forcefully to try to move the blockage so that bile can get by. HOME  CARE INSTRUCTIONS  Take medicines only as directed by your health care provider.  Drink enough fluid to keep your urine clear or pale yellow.  Avoid fatty, greasy, and fried foods. These kinds of foods increase your body's demand for bile.  Avoid any foods that make your pain worse.  Avoid overeating.  Avoid having a large meal after fasting. SEEK MEDICAL CARE IF:  You develop a fever.  Your pain gets worse.  You vomit.  You develop nausea that prevents you from  eating and drinking. SEEK IMMEDIATE MEDICAL CARE IF:  You suddenly develop a fever and shaking chills.  You develop a yellowish discoloration (jaundice) of:  Skin.  Whites of the eyes.  Mucous membranes.  You have continuous or severe pain that is not relieved with medicines.  You have nausea and vomiting that is not relieved with medicines.  You develop dizziness or you faint.   This information is not intended to replace advice given to you by your health care provider. Make sure you discuss any questions you have with your health care provider.   Document Released: 03/05/2006 Document Revised: 02/16/2015 Document Reviewed: 07/14/2014 Elsevier Interactive Patient Education 2016 [place] Diet for Pancreatitis or Gallbladder Conditions A low-fat diet can be helpful if you have pancreatitis or a gallbladder condition. With these conditions, your pancreas and gallbladder have trouble digesting fats. A healthy eating plan with less fat will help rest your pancreas and gallbladder and reduce your symptoms. WHAT DO I NEED TO KNOW ABOUT THIS DIET?  Eat a low-fat diet.  Reduce your fat intake to less than 20-30% of your total daily calories. This is less than 50-60 g of fat per day.  Remember that you need some fat in your diet. Ask your dietician what your daily goal should be.  Choose nonfat and low-fat healthy foods. Look for the words "nonfat," "low fat," or "fat free."  As a guide, look on the label and choose foods with less than 3 g of fat per serving. Eat only one serving.  Avoid alcohol.  Do not smoke. If you need help quitting, talk with your health care provider.  Eat small frequent meals instead of three large heavy meals. WHAT FOODS CAN I EAT? Grains Include healthy grains and starches such as potatoes, wheat bread, fiber-rich cereal, and brown rice. Choose whole grain options whenever possible. In adults, whole grains should account for 45-65%  of your daily calories.  Fruits and Vegetables Eat plenty of fruits and vegetables. Fresh fruits and vegetables add fiber to your diet. Meats and Other Protein Sources Eat lean meat such as chicken and pork. Trim any fat off of meat before cooking it. Eggs, fish, and beans are other sources of protein. In adults, these foods should account for 10-35% of your daily calories. Dairy Choose low-fat milk and dairy options. Dairy includes fat and protein, as well as calcium.  Fats and Oils Limit high-fat foods such as fried foods, sweets, baked goods, sugary drinks.  Other Creamy sauces and condiments, such as mayonnaise, can add extra fat. Think about whether or not you need to use them, or use smaller amounts or low fat options. WHAT FOODS ARE NOT RECOMMENDED?  High fat foods, such as:  [employer].  Ice cream.  [place] toast.  Sweet rolls.  Pizza.  Cheese bread.  Foods covered with batter, butter, creamy sauces, or cheese.  Fried foods.  Sugary drinks and desserts.  Foods that cause  gas or bloating   This information is not intended to replace advice given to you by your health care provider. Make sure you discuss any questions you have with your health care provider.   Document Released: 10/07/2013 Document Reviewed: 10/07/2013 Elsevier Interactive Patient Education 2016 [employer].     Hypertension Hypertension, commonly called high blood pressure, is when the force of blood pumping through your arteries is too strong. Your arteries are the blood vessels that carry blood from your heart throughout your body. A blood pressure reading consists of a higher number over a lower number, such as 110/72. The higher number (systolic) is the pressure inside your arteries when your heart pumps. The lower number (diastolic) is the pressure inside your arteries when your heart relaxes. Ideally you want your blood pressure below 120/80. Hypertension forces your heart to work harder to  pump blood. Your arteries may become narrow or stiff. Having untreated or uncontrolled hypertension can cause heart attack, stroke, kidney disease, and other problems. RISK FACTORS Some risk factors for high blood pressure are controllable. Others are not.  Risk factors you cannot control include:   Race. You may be at higher risk if you are African American.  Age. Risk increases with age.  Gender. Men are at higher risk than women before age 48 years. After age 41, women are at higher risk than men. Risk factors you can control include:  Not getting enough exercise or physical activity.  Being overweight.  Getting too much fat, sugar, calories, or salt in your diet.  Drinking too much alcohol. SIGNS AND SYMPTOMS Hypertension does not usually cause signs or symptoms. Extremely high blood pressure (hypertensive crisis) may cause headache, anxiety, shortness of breath, and nosebleed. DIAGNOSIS To check if you have hypertension, your health care provider will measure your blood pressure while you are seated, with your arm held at the level of your heart. It should be measured at least twice using the same arm. Certain conditions can cause a difference in blood pressure between your right and left arms. A blood pressure reading that is higher than normal on one occasion does not mean that you need treatment. If it is not clear whether you have high blood pressure, you may be asked to return on a different day to have your blood pressure checked again. Or, you may be asked to monitor your blood pressure at home for 1 or more weeks. TREATMENT Treating high blood pressure includes making lifestyle changes and possibly taking medicine. Living a healthy lifestyle can help lower high blood pressure. You may need to change some of your habits. Lifestyle changes may include:  Following the DASH diet. This diet is high in fruits, vegetables, and whole grains. It is low in salt, red meat, and added  sugars.  Keep your sodium intake below 2,300 mg per day.  Getting at least 30-45 minutes of aerobic exercise at least 4 times per week.  Losing weight if necessary.  Not smoking.  Limiting alcoholic beverages.  Learning ways to reduce stress. Your health care provider may prescribe medicine if lifestyle changes are not enough to get your blood pressure under control, and if one of the following is true:  You are 18-42 years of age and your systolic blood pressure is above 140.  You are 86 years of age or older, and your systolic blood pressure is above 150.  Your diastolic blood pressure is above 90.  You have diabetes, and your systolic blood pressure is  over [id number] or your diastolic blood pressure is over 90.  You have kidney disease and your blood pressure is above 140/90.  You have heart disease and your blood pressure is above 140/90. Your personal target blood pressure may vary depending on your medical conditions, your age, and other factors. HOME CARE INSTRUCTIONS  Have your blood pressure rechecked as directed by your health care provider.   Take medicines only as directed by your health care provider. Follow the directions carefully. Blood pressure medicines must be taken as prescribed. The medicine does not work as well when you skip doses. Skipping doses also puts you at risk for problems.  Do not smoke.   Monitor your blood pressure at home as directed by your health care provider. SEEK MEDICAL CARE IF:   You think you are having a reaction to medicines taken.  You have recurrent headaches or feel dizzy.  You have swelling in your ankles.  You have trouble with your vision. SEEK IMMEDIATE MEDICAL CARE IF:  You develop a severe headache or confusion.  You have unusual weakness, numbness, or feel faint.  You have severe chest or abdominal pain.  You vomit repeatedly.  You have trouble breathing. MAKE SURE YOU:   Understand these  instructions.  Will watch your condition.  Will get help right away if you are not doing well or get worse.   This information is not intended to replace advice given to you by your health care provider. Make sure you discuss any questions you have with your health care provider.   Document Released: 10/02/2005 Document Revised: 02/16/2015 Document Reviewed: 07/25/2013 Elsevier Interactive Patient Education [employer].

## 2015-12-09 NOTE — ED Provider Notes (Signed)
I interviewed the patient in the triage area after he had his abdominal ultrasound earlier today. Abdominal ultrasound shows gallbladder sludge with minimally thickened gallbladder wall. Patient states he feels much improved today he is hungry. He presently has no abdominal pain. I discussed case with Dr. Arnoldo Morale, surgeon. Dr. Arnoldo Morale does not feel the patient needs emergent cholecystectomy given normal LFTs, normal lipase, and no leukocytosis. Patient is advised to eat low-fat diet, no greasy fried or fatty foods. Return to the ED if symptoms worsen or develops fever or vomiting. He is given Dr. Arnoldo Morale office number to schedule an appointment within the next few weeks. Results for orders placed or performed during the hospital encounter of 12/08/15  CBC with Differential  Result Value Ref Range   WBC 6.6 4.0 - 10.5 K/uL   RBC 5.34 4.22 - 5.81 MIL/uL   Hemoglobin 15.9 13.0 - 17.0 g/dL   HCT 44.8 39.0 - 52.0 %   MCV 83.9 78.0 - 100.0 fL   MCH 29.8 26.0 - 34.0 pg   MCHC 35.5 30.0 - 36.0 g/dL   RDW 12.9 11.5 - 15.5 %   Platelets 190 150 - 400 K/uL   Neutrophils Relative % 53 %   Neutro Abs 3.5 1.7 - 7.7 K/uL   Lymphocytes Relative 36 %   Lymphs Abs 2.4 0.7 - 4.0 K/uL   Monocytes Relative 5 %   Monocytes Absolute 0.4 0.1 - 1.0 K/uL   Eosinophils Relative 5 %   Eosinophils Absolute 0.3 0.0 - 0.7 K/uL   Basophils Relative 1 %   Basophils Absolute 0.0 0.0 - 0.1 K/uL  Comprehensive metabolic panel  Result Value Ref Range   Sodium 140 135 - 145 mmol/L   Potassium 3.5 3.5 - 5.1 mmol/L   Chloride 106 101 - 111 mmol/L   CO2 25 22 - 32 mmol/L   Glucose, Bld 159 (H) 65 - 99 mg/dL   BUN 18 6 - 20 mg/dL   Creatinine, Ser 1.01 0.61 - 1.24 mg/dL   Calcium 10.0 8.9 - 10.3 mg/dL   Total Protein 7.8 6.5 - 8.1 g/dL   Albumin 4.6 3.5 - 5.0 g/dL   AST 24 15 - 41 U/L   ALT 26 17 - 63 U/L   Alkaline Phosphatase 53 38 - 126 U/L   Total Bilirubin 0.4 0.3 - 1.2 mg/dL   GFR calc non Af Amer >60 >60 mL/min    GFR calc Af Amer >60 >60 mL/min   Anion gap 9 5 - 15  Lipase, blood  Result Value Ref Range   Lipase 19 11 - 51 U/L  Troponin I  Result Value Ref Range   Troponin I <0.03 <0.031 ng/mL  Urinalysis, Routine w reflex microscopic (not at [facility])  Result Value Ref Range   Color, Urine STRAW (A) YELLOW   APPearance CLEAR CLEAR   Specific Gravity, Urine 1.015 1.005 - 1.030   pH 7.5 5.0 - 8.0   Glucose, UA 100 (A) NEGATIVE mg/dL   Hgb urine dipstick TRACE (A) NEGATIVE   Bilirubin Urine NEGATIVE NEGATIVE   Ketones, ur NEGATIVE NEGATIVE mg/dL   Protein, ur NEGATIVE NEGATIVE mg/dL   Nitrite NEGATIVE NEGATIVE   Leukocytes, UA NEGATIVE NEGATIVE  Urine microscopic-add on  Result Value Ref Range   Squamous Epithelial / LPF NONE SEEN NONE SEEN   WBC, UA NONE SEEN 0 - 5 WBC/hpf   RBC / HPF 0-5 0 - 5 RBC/hpf   Bacteria, UA FEW (A) NONE SEEN  US Abdomen Complete  12/09/2015  CLINICAL DATA:  Abdominal pain.  Gallbladder distended on CT EXAM: ABDOMEN ULTRASOUND COMPLETE COMPARISON:  CT abdomen and pelvis December 09, 2015 FINDINGS: Gallbladder: There are no demonstrable gallstones. The gallbladder wall appears subtly edematous. Mild sludge is noted in the gallbladder. There is no pericholecystic fluid. No sonographic Murphy sign noted by sonographer. Common bile duct: Diameter: 6 mm. There is no intrahepatic, common hepatic, or common bile duct dilatation. Liver: No focal lesion identified. Liver echogenicity appears mildly increased overall. IVC: Most of the inferior vena cava is obscured by gas. Pancreas: Essentially the entire pancreas is obscured by gas. Spleen: Size and appearance within normal limits. Right Kidney: Length: 11.0 cm. Echogenicity within normal limits. No mass or hydronephrosis visualized. Left Kidney: Length: 12.6 cm. Echogenicity within normal limits. No hydronephrosis visualized. There is a parapelvic cyst in the left kidney measuring 1.8 x 1.3 cm. There is an extrarenal pelvis on  the left. Abdominal aorta: No aneurysm visualized. Other findings: No demonstrable ascites. IMPRESSION: There is mild sludge in the gallbladder. The gallbladder wall appears subtly edematous. The possibility of early acalculus cholecystitis must be of concern. This finding may warrant nuclear medicine hepatobiliary imaging study to assess for cystic duct patency. Pancreas and most of the inferior vena cava are obscured by gas. Parapelvic cyst and extrarenal pelvis involving left kidney. Liver echogenicity appears mildly increased. Suspect a degree of hepatic steatosis. Study otherwise unremarkable. Electronically Signed   By: Lowella Grip III M.D.   On: 12/09/2015 12:48   Ct Abdomen Pelvis W Contrast  12/09/2015  CLINICAL DATA:  Severe upper abdominal pain. Nausea. Symptoms since eating dinner tonight. EXAM: CT ABDOMEN AND PELVIS WITH CONTRAST TECHNIQUE: Multidetector CT imaging of the abdomen and pelvis was performed using the standard protocol following bolus administration of intravenous contrast. CONTRAST:  136mL OMNIPAQUE IOHEXOL 300 MG/ML  SOLN COMPARISON:  01/04/2012 FINDINGS: Lower chest:  The included lung bases are clear. Liver: No focal lesion. Hepatobiliary: Mildly distended. No pericholecystic inflammation. No calcified stone. No biliary dilatation. Pancreas: Fatty atrophy. No ductal dilatation. There is a 10 mm fluid density structure arising from the distal body/tail. This is not definitively seen previously. Spleen: Normal. Adrenal glands: No nodule. Kidneys: Symmetric renal enhancement. No hydronephrosis. Parapelvic cysts in the left kidney, with tiny adjacent cortical cyst. Symmetric renal excretion on delayed phase imaging. Stomach/Bowel: Stomach distended with ingested contrast. No obstruction, oral contrast reaching the mid distal small bowel. There are no dilated or thickened small bowel loops. Small volume of stool throughout the colon without colonic wall thickening. Moderate distal  colonic diverticulosis in the descending and sigmoid colon, no acute diverticulitis. The appendix is normal. Vascular/Lymphatic: No retroperitoneal adenopathy. Abdominal aorta is normal in caliber. Mild atherosclerosis without aneurysm. Reproductive: Prostate gland mildly enlarged. Bladder: Physiologically distended, no wall thickening. Other: No free air, free fluid, or intra-abdominal fluid collection. Fat within the left inguinal canal. Small fat containing umbilical hernia. Musculoskeletal: There are no acute or suspicious osseous abnormalities. Degenerative change in the spine. Unilateral right L5 pars defect without listhesis. IMPRESSION: 1. Borderline gallbladder distention, no pericholecystic inflammation or calcified stone. Ultrasound could be considered if there is clinical concern for hepatobiliary pathology. 2. Otherwise no acute abnormality in the abdomen/pelvis. 3. Subcentimeter hypodensity in the distal pancreatic body/tail, not definitively seen prior. There is underlying fatty atrophy of the pancreatic parenchyma. This may reflect focal pancreatic tissue versus a side branch IPMN. Nonemergent pancreatic protocol MRI could be considered for further  evaluation, particularly when patient is able to tolerate breath hold technique. Electronically Signed   By: Jeb Levering M.D.   On: 12/09/2015 01:03     Orlie Dakin, MD 12/09/15 1325

## 2015-12-15 DIAGNOSIS — J3489 Other specified disorders of nose and nasal sinuses: Secondary | ICD-10-CM | POA: Insufficient documentation

## 2015-12-15 DIAGNOSIS — H6983 Other specified disorders of Eustachian tube, bilateral: Secondary | ICD-10-CM | POA: Insufficient documentation

## 2015-12-17 ENCOUNTER — Ambulatory Visit (INDEPENDENT_AMBULATORY_CARE_PROVIDER_SITE_OTHER): Payer: Medicare Other | Admitting: Internal Medicine

## 2015-12-17 ENCOUNTER — Encounter: Payer: Self-pay | Admitting: Internal Medicine

## 2015-12-17 VITALS — BP 138/80 | Temp 98.4°F | Ht 68.5 in | Wt 208.4 lb

## 2015-12-17 DIAGNOSIS — K838 Other specified diseases of biliary tract: Secondary | ICD-10-CM | POA: Diagnosis not present

## 2015-12-17 DIAGNOSIS — I10 Essential (primary) hypertension: Secondary | ICD-10-CM

## 2015-12-17 DIAGNOSIS — Z87898 Personal history of other specified conditions: Secondary | ICD-10-CM

## 2015-12-17 DIAGNOSIS — R932 Abnormal findings on diagnostic imaging of liver and biliary tract: Secondary | ICD-10-CM | POA: Diagnosis not present

## 2015-12-17 MED FILL — Oxycodone w/ Acetaminophen Tab 5-325 MG: ORAL | Qty: 6 | Status: AC

## 2015-12-17 NOTE — Patient Instructions (Addendum)
Plan on getting  Gi to  Help evaluation next step Pancreas  Imaging   MRI   protochol.     Will be contacted about this appt .  Could have been a gall bladder  Problem as we discussed  But could     also have just gastritis . Also will set up .   For Dr.  Sydell Axon to see you about  All of the above   ? If endoscopy or biliary scan  Should be done.   Ok to use maalox   Or add protoinix if needed in the interim
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
## 2015-12-20 ENCOUNTER — Encounter: Payer: Self-pay | Admitting: Internal Medicine

## 2015-12-24 ENCOUNTER — Encounter: Payer: Medicare Other | Admitting: Internal Medicine

## 2016-01-04 ENCOUNTER — Ambulatory Visit (INDEPENDENT_AMBULATORY_CARE_PROVIDER_SITE_OTHER): Payer: Medicare Other | Admitting: Internal Medicine

## 2016-01-04 ENCOUNTER — Encounter: Payer: Self-pay | Admitting: Internal Medicine

## 2016-01-04 ENCOUNTER — Other Ambulatory Visit: Payer: Self-pay

## 2016-01-04 VITALS — BP 143/76 | HR 82 | Temp 98.2°F | Ht 68.5 in | Wt 209.0 lb

## 2016-01-04 DIAGNOSIS — R9389 Abnormal findings on diagnostic imaging of other specified body structures: Secondary | ICD-10-CM

## 2016-01-04 DIAGNOSIS — R1011 Right upper quadrant pain: Secondary | ICD-10-CM

## 2016-01-04 DIAGNOSIS — Z1211 Encounter for screening for malignant neoplasm of colon: Secondary | ICD-10-CM

## 2016-01-04 DIAGNOSIS — K219 Gastro-esophageal reflux disease without esophagitis: Secondary | ICD-10-CM | POA: Diagnosis not present

## 2016-01-04 DIAGNOSIS — R1012 Left upper quadrant pain: Secondary | ICD-10-CM | POA: Diagnosis not present

## 2016-01-04 DIAGNOSIS — Q453 Other congenital malformations of pancreas and pancreatic duct: Secondary | ICD-10-CM
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
## 2016-01-04 NOTE — Patient Instructions (Signed)
   Schedule Pancreatic MRI - to follow-up on abnormality on recent CT  I anticipate an upper endoscopy and colonoscopy to be scheduled in the near future as well

## 2016-01-06 ENCOUNTER — Ambulatory Visit: Payer: Medicare Other | Admitting: Gastroenterology

## 2016-01-11 ENCOUNTER — Ambulatory Visit (HOSPITAL_COMMUNITY)
Admission: RE | Admit: 2016-01-11 | Discharge: 2016-01-11 | Disposition: A | Discharge status: Home / Self Care | Payer: Medicare Other | Source: Ambulatory Visit | Attending: Internal Medicine | Admitting: Internal Medicine

## 2016-01-11 ENCOUNTER — Other Ambulatory Visit: Payer: Self-pay | Admitting: Internal Medicine

## 2016-01-11 DIAGNOSIS — R938 Abnormal findings on diagnostic imaging of other specified body structures: Secondary | ICD-10-CM | POA: Diagnosis not present

## 2016-01-11 DIAGNOSIS — K862 Cyst of pancreas: Secondary | ICD-10-CM | POA: Insufficient documentation

## 2016-01-11 DIAGNOSIS — K8689 Other specified diseases of pancreas: Secondary | ICD-10-CM | POA: Diagnosis not present

## 2016-01-11 DIAGNOSIS — K449 Diaphragmatic hernia without obstruction or gangrene: Secondary | ICD-10-CM | POA: Insufficient documentation

## 2016-01-11 DIAGNOSIS — R9389 Abnormal findings on diagnostic imaging of other specified body structures: Secondary | ICD-10-CM

## 2016-01-11 MED ORDER — GADOBENATE DIMEGLUMINE 529 MG/ML IV SOLN
20.0000 mL | Freq: Once | INTRAVENOUS | Status: AC | PRN
  Administered 2016-01-11: 20 mL via INTRAVENOUS

## 2016-01-12 ENCOUNTER — Other Ambulatory Visit: Payer: Self-pay

## 2016-01-12 DIAGNOSIS — R9389 Abnormal findings on diagnostic imaging of other specified body structures: Secondary | ICD-10-CM

## 2016-01-12 MED ORDER — PEG 3350-KCL-NA BICARB-NACL 420 G PO SOLR: 4000.0000 mL | Freq: Once | ORAL | Status: DC

## 2016-01-13 ENCOUNTER — Other Ambulatory Visit: Payer: Self-pay | Admitting: Internal Medicine

## 2016-01-14 NOTE — Telephone Encounter (Signed)
Refilled x 10 sent in

## 2016-01-31 ENCOUNTER — Ambulatory Visit (HOSPITAL_COMMUNITY)
Admission: RE | Admit: 2016-01-31 | Discharge: 2016-01-31 | Disposition: A | Discharge status: Home Health | Payer: Medicare Other | Source: Ambulatory Visit | Attending: Internal Medicine | Admitting: Internal Medicine

## 2016-01-31 ENCOUNTER — Encounter (HOSPITAL_COMMUNITY)
Admission: RE | Disposition: A | Discharge status: Home Health | Payer: Self-pay | Source: Ambulatory Visit | Attending: Internal Medicine

## 2016-01-31 ENCOUNTER — Encounter (HOSPITAL_COMMUNITY): Payer: Self-pay | Admitting: *Deleted

## 2016-01-31 DIAGNOSIS — R1010 Upper abdominal pain, unspecified: Secondary | ICD-10-CM | POA: Insufficient documentation

## 2016-01-31 DIAGNOSIS — D120 Benign neoplasm of cecum: Secondary | ICD-10-CM

## 2016-01-31 DIAGNOSIS — Z860100 Personal history of colon polyps, unspecified: Secondary | ICD-10-CM | POA: Insufficient documentation

## 2016-01-31 DIAGNOSIS — K2100 Gastro-esophageal reflux disease with esophagitis, without bleeding: Secondary | ICD-10-CM | POA: Insufficient documentation

## 2016-01-31 DIAGNOSIS — K319 Disease of stomach and duodenum, unspecified: Secondary | ICD-10-CM | POA: Insufficient documentation

## 2016-01-31 DIAGNOSIS — I10 Essential (primary) hypertension: Secondary | ICD-10-CM | POA: Diagnosis not present

## 2016-01-31 DIAGNOSIS — K209 Esophagitis, unspecified: Secondary | ICD-10-CM | POA: Diagnosis not present

## 2016-01-31 DIAGNOSIS — K2960 Other gastritis without bleeding: Secondary | ICD-10-CM | POA: Diagnosis not present

## 2016-01-31 DIAGNOSIS — Z79899 Other long term (current) drug therapy: Secondary | ICD-10-CM | POA: Diagnosis not present

## 2016-01-31 DIAGNOSIS — R12 Heartburn: Secondary | ICD-10-CM | POA: Insufficient documentation

## 2016-01-31 DIAGNOSIS — Z1211 Encounter for screening for malignant neoplasm of colon: Secondary | ICD-10-CM

## 2016-01-31 DIAGNOSIS — K5730 Diverticulosis of large intestine without perforation or abscess without bleeding: Secondary | ICD-10-CM | POA: Diagnosis not present

## 2016-01-31 DIAGNOSIS — D123 Benign neoplasm of transverse colon: Secondary | ICD-10-CM | POA: Diagnosis not present

## 2016-01-31 DIAGNOSIS — E781 Pure hyperglyceridemia: Secondary | ICD-10-CM | POA: Insufficient documentation

## 2016-01-31 DIAGNOSIS — K3189 Other diseases of stomach and duodenum: Secondary | ICD-10-CM | POA: Diagnosis not present

## 2016-01-31 DIAGNOSIS — M1991 Primary osteoarthritis, unspecified site: Secondary | ICD-10-CM | POA: Diagnosis not present

## 2016-01-31 DIAGNOSIS — R040 Epistaxis: Secondary | ICD-10-CM | POA: Insufficient documentation

## 2016-01-31 DIAGNOSIS — Z87891 Personal history of nicotine dependence: Secondary | ICD-10-CM | POA: Diagnosis not present

## 2016-01-31 DIAGNOSIS — J392 Other diseases of pharynx: Secondary | ICD-10-CM

## 2016-01-31 DIAGNOSIS — R9389 Abnormal findings on diagnostic imaging of other specified body structures: Secondary | ICD-10-CM

## 2016-01-31 DIAGNOSIS — G4733 Obstructive sleep apnea (adult) (pediatric): Secondary | ICD-10-CM | POA: Diagnosis not present

## 2016-01-31 HISTORY — PX: COLONOSCOPY: SHX5424

## 2016-01-31 HISTORY — PX: ESOPHAGOGASTRODUODENOSCOPY: SHX5428

## 2016-01-31 SURGERY — COLONOSCOPY
Anesthesia: Moderate Sedation

## 2016-01-31 MED ORDER — SODIUM CHLORIDE 0.9 % IV SOLN
INTRAVENOUS | Status: DC
  Administered 2016-01-31: 13:00:00 via INTRAVENOUS

## 2016-01-31 MED ORDER — ONDANSETRON HCL 4 MG/2ML IJ SOLN
INTRAMUSCULAR | Status: AC
  Filled 2016-01-31: qty 2

## 2016-01-31 MED ORDER — LIDOCAINE VISCOUS 2 % MT SOLN
OROMUCOSAL | Status: DC | PRN
  Administered 2016-01-31: 3 mL via OROMUCOSAL

## 2016-01-31 MED ORDER — MIDAZOLAM HCL 5 MG/5ML IJ SOLN
INTRAMUSCULAR | Status: DC | PRN
  Administered 2016-01-31: 2 mg via INTRAVENOUS
  Administered 2016-01-31 (×4): 1 mg via INTRAVENOUS

## 2016-01-31 MED ORDER — ONDANSETRON HCL 4 MG/2ML IJ SOLN
INTRAMUSCULAR | Status: DC | PRN
  Administered 2016-01-31: 4 mg via INTRAVENOUS

## 2016-01-31 MED ORDER — MIDAZOLAM HCL 5 MG/5ML IJ SOLN
INTRAMUSCULAR | Status: AC
  Filled 2016-01-31: qty 10

## 2016-01-31 MED ORDER — MEPERIDINE HCL 100 MG/ML IJ SOLN
INTRAMUSCULAR | Status: DC | PRN
  Administered 2016-01-31: 50 mg via INTRAVENOUS
  Administered 2016-01-31: 25 mg via INTRAVENOUS

## 2016-01-31 MED ORDER — MEPERIDINE HCL 100 MG/ML IJ SOLN
INTRAMUSCULAR | Status: AC
  Filled 2016-01-31: qty 2

## 2016-01-31 MED ORDER — STERILE WATER FOR IRRIGATION IR SOLN
Status: DC | PRN
Start: 2016-01-31 — End: 2016-01-31
  Administered 2016-01-31: 14:00:00

## 2016-01-31 MED ORDER — LIDOCAINE VISCOUS 2 % MT SOLN
OROMUCOSAL | Status: AC
  Filled 2016-01-31: qty 15
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
## 2016-01-31 NOTE — Interval H&P Note (Signed)
History and Physical Interval Note:  01/31/2016 1:46 PM  Kedrick DONTERIUS RIDOLFI  has presented today for surgery, with the diagnosis of abnormal CT scan  The various methods of treatment have been discussed with the patient and family. After consideration of risks, benefits and other options for treatment, the patient has consented to  Procedure(s) with comments: COLONOSCOPY (N/A) - 1430 ESOPHAGOGASTRODUODENOSCOPY (EGD) (N/A) as a surgical intervention .  The patient's history has been reviewed, patient examined, no change in status, stable for surgery.  I have reviewed the patient's chart and labs.  Questions were answered to the patient's satisfaction.     Raigen Jagielski  No change. No dysphagia. EGD and colonoscopy per plan. The risks, benefits, limitations, imponderables and alternatives regarding both EGD and colonoscopy have been reviewed with the patient. Questions have been answered. All parties agreeable.

## 2016-01-31 NOTE — Op Note (Addendum)
[facility] Patient Name: Bryan Cabrera Procedure Date: 01/31/2016 1:49 PM MRN: [id number] Date of Birth: 1940/12/10 Attending MD: Norvel Richards , MD CSN: [id number] Age: 75 Admit Type: Outpatient Procedure:                Upper GI endoscopy Indications:              Heartburn Providers:                Norvel Richards, MD, Gwenlyn Fudge, RN, Georgeann Oppenheim, Technician Referring MD:             Shanon Ace, MD Medicines:                Midazolam 3 mg IV, Meperidine 75 mg IV, Ondansetron                            4 mg IV Complications:            No immediate complications. Estimated Blood Loss:     Estimated blood loss was minimal. Procedure:                Pre-Anesthesia Assessment:                           - Prior to the procedure, a History and Physical                            was performed, and patient medications and                            allergies were reviewed. The patient's tolerance of                            previous anesthesia was also reviewed. The risks                            and benefits of the procedure and the sedation                            options and risks were discussed with the patient.                            All questions were answered, and informed consent                            was obtained. Prior Anticoagulants: The patient has                            taken no previous anticoagulant or antiplatelet                            agents. ASA Grade Assessment: II - A patient with  mild systemic disease. After reviewing the risks                            and benefits, the patient was deemed in                            satisfactory condition to undergo the procedure.                           - Prior to the procedure, a History and Physical                            was performed, and patient medications and                            allergies were reviewed. The  patient's tolerance of                            previous anesthesia was also reviewed. The risks                            and benefits of the procedure and the sedation                            options and risks were discussed with the patient.                            All questions were answered, and informed consent                            was obtained. [Anticoagulant Agents]. [ASA Grade].                            After reviewing the risks and benefits, the patient                            was deemed in satisfactory condition to undergo the                            procedure.                           After obtaining informed consent, the endoscope was                            passed under direct vision. Throughout the                            procedure, the patient's blood pressure, pulse, and                            oxygen saturations were monitored continuously. The  [id number] scope was introduced through the                            mouth, and advanced to the second part of duodenum.                            The upper GI endoscopy was accomplished without                            difficulty. The patient tolerated the procedure                            well. The patient tolerated the procedure well. Scope In: 1:58:09 PM Scope Out: 2:05:36 PM Total Procedure Duration: 0 hours 7 minutes 27 seconds  Findings:      8 mm polypoid mass was found on the medial side of the right arytenoid       cartilage, occasionally ball valving down into the opening to the       tracheal inlet. . Please see photograph..      LA Grade B (one or more mucosal breaks greater than 5 mm, not extending       between the tops of two mucosal folds) esophagitis with no bleeding was       found 35 to 37 cm from the incisors. no Barrett's epithelium seen.      Diffuse moderately erythematous mucosa was found in the entire examined       stomach. This  was biopsied with a cold forceps for histology. Estimated       blood loss was minimal.      the first and second portion of the duodenum appeared normal. Biopsies       of the abnormal gastric mucosa taken for histologic study Impression:               - Medium sized polypoid mass found on the right                            arytenoid cartilage                           - LA Grade B esophagitis.                           - Erythematous mucosa in the stomach. Biopsied.                           - Normal second portion of the duodenum. Moderate Sedation:      Moderate (conscious) sedation was administered by the endoscopy nurse       and supervised by the endoscopist. The following parameters were       monitored: oxygen saturation, heart rate, blood pressure, respiratory       rate, EKG, adequacy of pulmonary ventilation, and response to care. Recommendation:           - Patient has a contact number available for                            emergencies. The signs and  symptoms of potential                            delayed complications were discussed with the                            patient. Return to normal activities tomorrow.                            Written discharge instructions were provided to the                            patient.                           - Advance diet as tolerated.                           - Continue present medications.                           - Use Protonix (pantoprazole) 40 mg PO BID for 1                            month. then decreased to 40 mg once daily                            thereafter. Recommend ENT referral to further                            evaluate the abnormality found on today's                            examination. Procedure Code(s):        --- Professional ---                           [phone], Esophagogastroduodenoscopy, flexible,                            transoral; with biopsy, single or multiple Diagnosis Code(s):         --- Professional ---                           J39.2, Other diseases of pharynx                           K20.9, Esophagitis, unspecified                           K31.89, Other diseases of stomach and duodenum                           R12, Heartburn CPT copyright 2016 [employer]. All rights reserved. The codes documented in this report are preliminary and upon coder review may  be revised to meet current compliance requirements. Cristopher Estimable. Gala Romney, MD Herbie Baltimore  Garfield Cornea, MD 01/31/2016 2:43:39 PM This report has been signed electronically. Number of Addenda: 0

## 2016-01-31 NOTE — Op Note (Signed)
[facility] Patient Name: Bryan Cabrera Procedure Date: 01/31/2016 2:07 PM MRN: [id number] Date of Birth: 02/29/1941 Attending MD: Norvel Richards , MD CSN: [id number] Age: 75 Admit Type: Outpatient Procedure:                Colonoscopy Indications:              Screening for colorectal malignant neoplasm Providers:                Norvel Richards, MD, Gwenlyn Fudge, RN, Georgeann Oppenheim, Technician Referring MD:              Medicines:                Midazolam 6 mg IV, Meperidine 75 mg IV, Ondansetron                            4 mg IV Complications:            No immediate complications. Estimated Blood Loss:     Estimated blood loss was minimal. Procedure:                Pre-Anesthesia Assessment:                           - Prior to the procedure, a History and Physical                            was performed, and patient medications and                            allergies were reviewed. The patient's tolerance of                            previous anesthesia was also reviewed. The risks                            and benefits of the procedure and the sedation                            options and risks were discussed with the patient.                            All questions were answered, and informed consent                            was obtained. Prior Anticoagulants: The patient has                            taken no previous anticoagulant or antiplatelet                            agents. ASA Grade Assessment: II - A patient with  mild systemic disease. After reviewing the risks                            and benefits, the patient was deemed in                            satisfactory condition to undergo the procedure.                           - Prior to the procedure, a History and Physical                            was performed, and patient medications and                            allergies were  reviewed. The patient's tolerance of                            previous anesthesia was also reviewed. The risks                            and benefits of the procedure and the sedation                            options and risks were discussed with the patient.                            All questions were answered, and informed consent                            was obtained. [Anticoagulant Agents]. [ASA Grade].                            After reviewing the risks and benefits, the patient                            was deemed in satisfactory condition to undergo the                            procedure.                           - Prior to the procedure, a History and Physical                            was performed, and patient medications and                            allergies were reviewed. The patient's tolerance of                            previous anesthesia was also reviewed. The risks  and benefits of the procedure and the sedation                            options and risks were discussed with the patient.                            All questions were answered, and informed consent                            was obtained. Prior Anticoagulants: The patient has                            taken no previous anticoagulant or antiplatelet                            agents. ASA Grade Assessment: II - A patient with                            mild systemic disease. After reviewing the risks                            and benefits, the patient was deemed in                            satisfactory condition to undergo the procedure.                           - Prior to the procedure, a History and Physical                            was performed, and patient medications and                            allergies were reviewed. The patient's tolerance of                            previous anesthesia was also reviewed. The risks                             and benefits of the procedure and the sedation                            options and risks were discussed with the patient.                            All questions were answered, and informed consent                            was obtained. [Anticoagulant Agents]. [ASA Grade].                            After reviewing the risks and benefits, the patient  was deemed in satisfactory condition to undergo the                            procedure.                           After obtaining informed consent, the colonoscope                            was passed under direct vision. Throughout the                            procedure, the patient's blood pressure, pulse, and                            oxygen saturations were monitored continuously. The                            [id number] scope was introduced through                            the anus and advanced to the the cecum, identified                            by appendiceal orifice and ileocecal valve. The                            colonoscopy was performed without difficulty. The                            patient tolerated the procedure well. The quality                            of the bowel preparation was adequate. The                            ileocecal valve, appendiceal orifice, and rectum                            were photographed. Scope In: 2:14:29 PM Scope Out: 2:36:23 PM Scope Withdrawal Time: 0 hours 14 minutes 48 seconds  Total Procedure Duration: 0 hours 21 minutes 54 seconds  Findings:      A 2 mm polyp was found in the cecum. The polyp was sessile. The polyp       was removed with a cold snare. Resection and retrieval were complete.       Estimated blood loss was minimal.      3 sessile polyps were found in the ileocecal valve. The polyps were       medium in size. These polyps were removed with a hot snare. Resection       and retrieval were complete. Estimated blood loss:  none.      Three semi-pedunculated polyps were found in the hepatic flexure. The       polyps were 4 to 6 mm in size. These polyps were removed with a cold  snare. Resection and retrieval were complete. Estimated blood loss was       minimal.      Multiple medium-mouthed diverticula were found in the sigmoid colon and       descending colon.      The exam was otherwise without abnormality on direct and retroflexion       views.      The perianal and digital rectal examinations were normal. Impression:               - One 2 mm polyp in the cecum, removed with a cold                            snare. Resected and retrieved.                           - Eight medium polyps at the ileocecal valve,                            removed with a hot snare. Resected and retrieved.                           - Three 4 to 6 mm polyps at the hepatic flexure,                            removed with a cold snare. Resected and retrieved.                           - Diverticulosis in the sigmoid colon and in the                            descending colon.                           - The examination was otherwise normal on direct                            and retroflexion views. Moderate Sedation:      Moderate (conscious) sedation was administered by the endoscopy nurse       and supervised by the endoscopist. The following parameters were       monitored: oxygen saturation, heart rate, blood pressure, respiratory       rate, EKG, adequacy of pulmonary ventilation, and response to care.       Total physician intraservice time was 46 minutes. Recommendation:           - Advance diet as tolerated.                           - Continue present medications.                           - Await pathology results.                           - Repeat colonoscopy date to be determined after  pending pathology results are reviewed for                            surveillance based on  pathology results.                           - Return to GI office in 3 months. See EGD report.                           - Resume regular diet. Procedure Code(s):        --- Professional ---                           [phone], Colonoscopy, flexible; with removal of                            tumor(s), polyp(s), or other lesion(s) by snare                            technique                           99152, Moderate sedation services provided by the                            same physician or other qualified health care                            professional performing the diagnostic or                            therapeutic service that the sedation supports,                            requiring the presence of an independent trained                            observer to assist in the monitoring of the                            patient's level of consciousness and physiological                            status; initial 15 minutes of intraservice time,                            patient age 76 years or older                           [phone], Moderate sedation services; each additional                            15 minutes intraservice time  [id number], Moderate sedation services; each additional                            15 minutes intraservice time Diagnosis Code(s):        --- Professional ---                           Z12.11, Encounter for screening for malignant                            neoplasm of colon                           D12.0, Benign neoplasm of cecum                           D12.3, Benign neoplasm of transverse colon (hepatic                            flexure or splenic flexure)                           K57.30, Diverticulosis of large intestine without                            perforation or abscess without bleeding CPT copyright 2016 [employer]. All rights reserved. The codes documented in this report are preliminary and upon  coder review may  be revised to meet current compliance requirements. Cristopher Estimable. Zavia Pullen, MD Norvel Richards, MD 01/31/2016 2:49:57 PM This report has been signed electronically. Number of Addenda: 0

## 2016-01-31 NOTE — Discharge Instructions (Signed)
Colonoscopy Discharge Instructions  Read the instructions outlined below and refer to this sheet in the next few weeks. These discharge instructions provide you with general information on caring for yourself after you leave the hospital. Your doctor may also give you specific instructions. While your treatment has been planned according to the most current medical practices available, unavoidable complications occasionally occur. If you have any problems or questions after discharge, call Dr. Gala Romney at [phone]. ACTIVITY  You may resume your regular activity, but move at a slower pace for the next 24 hours.   Take frequent rest periods for the next 24 hours.   Walking will help get rid of the air and reduce the bloated feeling in your belly (abdomen).   No driving for 24 hours (because of the medicine (anesthesia) used during the test).    Do not sign any important legal documents or operate any machinery for 24 hours (because of the anesthesia used during the test).  NUTRITION  Drink plenty of fluids.   You may resume your normal diet as instructed by your doctor.   Begin with a light meal and progress to your normal diet. Heavy or fried foods are harder to digest and may make you feel sick to your stomach (nauseated).   Avoid alcoholic beverages for 24 hours or as instructed.  MEDICATIONS  You may resume your normal medications unless your doctor tells you otherwise.  WHAT YOU CAN EXPECT TODAY  Some feelings of bloating in the abdomen.   Passage of more gas than usual.   Spotting of blood in your stool or on the toilet paper.  IF YOU HAD POLYPS REMOVED DURING THE COLONOSCOPY:  No aspirin products for 7 days or as instructed.   No alcohol for 7 days or as instructed.   Eat a soft diet for the next 24 hours.  FINDING OUT THE RESULTS OF YOUR TEST Not all test results are available during your visit. If your test results are not back during the visit, make an appointment  with your caregiver to find out the results. Do not assume everything is normal if you have not heard from your caregiver or the medical facility. It is important for you to follow up on all of your test results.  SEEK IMMEDIATE MEDICAL ATTENTION IF:  You have more than a spotting of blood in your stool.   Your belly is swollen (abdominal distention).   You are nauseated or vomiting.   You have a temperature over 101.   You have abdominal pain or discomfort that is severe or gets worse throughout the day.   EGD Discharge instructions Please read the instructions outlined below and refer to this sheet in the next few weeks. These discharge instructions provide you with general information on caring for yourself after you leave the hospital. Your doctor may also give you specific instructions. While your treatment has been planned according to the most current medical practices available, unavoidable complications occasionally occur. If you have any problems or questions after discharge, please call your doctor. ACTIVITY  You may resume your regular activity but move at a slower pace for the next 24 hours.   Take frequent rest periods for the next 24 hours.   Walking will help expel (get rid of) the air and reduce the bloated feeling in your abdomen.   No driving for 24 hours (because of the anesthesia (medicine) used during the test).   You may shower.   Do not sign any  important legal documents or operate any machinery for 24 hours (because of the anesthesia used during the test).  NUTRITION  Drink plenty of fluids.   You may resume your normal diet.   Begin with a light meal and progress to your normal diet.   Avoid alcoholic beverages for 24 hours or as instructed by your caregiver.  MEDICATIONS  You may resume your normal medications unless your caregiver tells you otherwise.  WHAT YOU CAN EXPECT TODAY  You may experience abdominal discomfort such as a feeling of  fullness or gas pains.  FOLLOW-UP  Your doctor will discuss the results of your test with you.  SEEK IMMEDIATE MEDICAL ATTENTION IF ANY OF THE FOLLOWING OCCUR:  Excessive nausea (feeling sick to your stomach) and/or vomiting.   Severe abdominal pain and distention (swelling).   Trouble swallowing.   Temperature over 101 F (37.8 C).   Rectal bleeding or vomiting of blood.   GERD, colon polyp and diverticulosis information provided  You have a nodule just above your vocal cords which is not in your GI tract. This needs further evaluation by an ENT specialist.  Begin Protonix 40 mg twice daily for the next month and then decrease to 40 mg once daily thereafter  Further recommendations to follow pending review of pathology report  Office visit with [place] in 3 months.  Colon Polyps Polyps are lumps of extra tissue growing inside the body. Polyps can grow in the large intestine (colon). Most colon polyps are noncancerous (benign). However, some colon polyps can become cancerous over time. Polyps that are larger than a pea may be harmful. To be safe, caregivers remove and test all polyps. CAUSES  Polyps form when mutations in the genes cause your cells to grow and divide even though no more tissue is needed. RISK FACTORS There are a number of risk factors that can increase your chances of getting colon polyps. They include:  Being older than 50 years.  Family history of colon polyps or colon cancer.  Long-term colon diseases, such as colitis or Crohn disease.  Being overweight.  Smoking.  Being inactive.  Drinking too much alcohol. SYMPTOMS  Most small polyps do not cause symptoms. If symptoms are present, they may include:  Blood in the stool. The stool may look dark red or black.  Constipation or diarrhea that lasts longer than 1 week. DIAGNOSIS People often do not know they have polyps until their caregiver finds them during a regular checkup. Your caregiver can use  4 tests to check for polyps:  Digital rectal exam. The caregiver wears gloves and feels inside the rectum. This test would find polyps only in the rectum.  Barium enema. The caregiver puts a liquid called barium into your rectum before taking X-rays of your colon. Barium makes your colon look white. Polyps are dark, so they are easy to see in the X-ray pictures.  Sigmoidoscopy. A thin, flexible tube (sigmoidoscope) is placed into your rectum. The sigmoidoscope has a light and tiny camera in it. The caregiver uses the sigmoidoscope to look at the last third of your colon.  Colonoscopy. This test is like sigmoidoscopy, but the caregiver looks at the entire colon. This is the most common method for finding and removing polyps. TREATMENT  Any polyps will be removed during a sigmoidoscopy or colonoscopy. The polyps are then tested for cancer. PREVENTION  To help lower your risk of getting more colon polyps:  Eat plenty of fruits and vegetables. Avoid eating fatty foods.  Do not smoke.  Avoid drinking alcohol.  Exercise every day.  Lose weight if recommended by your caregiver.  Eat plenty of calcium and folate. Foods that are rich in calcium include milk, cheese, and broccoli. Foods that are rich in folate include chickpeas, kidney beans, and spinach. HOME CARE INSTRUCTIONS Keep all follow-up appointments as directed by your caregiver. You may need periodic exams to check for polyps. SEEK MEDICAL CARE IF: You notice bleeding during a bowel movement.   This information is not intended to replace advice given to you by your health care provider. Make sure you discuss any questions you have with your health care provider.   Gastroesophageal Reflux Disease, Adult Normally, food travels down the esophagus and stays in the stomach to be digested. However, when a person has gastroesophageal reflux disease (GERD), food and stomach acid move back up into the esophagus. When this happens, the  esophagus becomes sore and inflamed. Over time, GERD can create small holes (ulcers) in the lining of the esophagus.  CAUSES This condition is caused by a problem with the muscle between the esophagus and the stomach (lower esophageal sphincter, or LES). Normally, the LES muscle closes after food passes through the esophagus to the stomach. When the LES is weakened or abnormal, it does not close properly, and that allows food and stomach acid to go back up into the esophagus. The LES can be weakened by certain dietary substances, medicines, and medical conditions, including:  Tobacco use.  Pregnancy.  Having a hiatal hernia.  Heavy alcohol use.  Certain foods and beverages, such as coffee, chocolate, onions, and peppermint. RISK FACTORS This condition is more likely to develop in:  People who have an increased body weight.  People who have connective tissue disorders.  People who use NSAID medicines. SYMPTOMS Symptoms of this condition include:  Heartburn.  Difficult or painful swallowing.  The feeling of having a lump in the throat.  Abitter taste in the mouth.  Bad breath.  Having a large amount of saliva.  Having an upset or bloated stomach.  Belching.  Chest pain.  Shortness of breath or wheezing.  Ongoing (chronic) cough or a night-time cough.  Wearing away of tooth enamel.  Weight loss. Different conditions can cause chest pain. Make sure to see your health care provider if you experience chest pain. DIAGNOSIS Your health care provider will take a medical history and perform a physical exam. To determine if you have mild or severe GERD, your health care provider may also monitor how you respond to treatment. You may also have other tests, including:  An endoscopy toexamine your stomach and esophagus with a small camera.  A test thatmeasures the acidity level in your esophagus.  A test thatmeasures how much pressure is on your esophagus.  A barium  swallow or modified barium swallow to show the shape, size, and functioning of your esophagus. TREATMENT The goal of treatment is to help relieve your symptoms and to prevent complications. Treatment for this condition may vary depending on how severe your symptoms are. Your health care provider may recommend:  Changes to your diet.  Medicine.  Surgery. HOME CARE INSTRUCTIONS Diet  Follow a diet as recommended by your health care provider. This may involve avoiding foods and drinks such as:  Coffee and tea (with or without caffeine).  Drinks that containalcohol.  Energy drinks and sports drinks.  Carbonated drinks or sodas.  Chocolate and cocoa.  Peppermint and mint flavorings.  Garlic and onions.  Horseradish.  Spicy and acidic foods, including peppers, chili powder, curry powder, vinegar, hot sauces, and barbecue sauce.  Citrus fruit juices and citrus fruits, such as oranges, lemons, and limes.  Tomato-based foods, such as red sauce, chili, salsa, and pizza with red sauce.  Fried and fatty foods, such as donuts, french fries, potato chips, and high-fat dressings.  High-fat meats, such as hot dogs and fatty cuts of red and white meats, such as rib eye steak, sausage, ham, and bacon.  High-fat dairy items, such as whole milk, butter, and cream cheese.  Eat small, frequent meals instead of large meals.  Avoid drinking large amounts of liquid with your meals.  Avoid eating meals during the 2-3 hours before bedtime.  Avoid lying down right after you eat.  Do not exercise right after you eat. General Instructions  Pay attention to any changes in your symptoms.  Take over-the-counter and prescription medicines only as told by your health care provider. Do not take aspirin, ibuprofen, or other NSAIDs unless your health care provider told you to do so.  Do not use any tobacco products, including cigarettes, chewing tobacco, and e-cigarettes. If you need help  quitting, ask your health care provider.  Wear loose-fitting clothing. Do not wear anything tight around your waist that causes pressure on your abdomen.  Raise (elevate) the head of your bed 6 inches (15cm).  Try to reduce your stress, such as with yoga or meditation. If you need help reducing stress, ask your health care provider.  If you are overweight, reduce your weight to an amount that is healthy for you. Ask your health care provider for guidance about a safe weight loss goal.  Keep all follow-up visits as told by your health care provider. This is important. SEEK MEDICAL CARE IF:  You have new symptoms.  You have unexplained weight loss.  You have difficulty swallowing, or it hurts to swallow.  You have wheezing or a persistent cough.  Your symptoms do not improve with treatment.  You have a hoarse voice. SEEK IMMEDIATE MEDICAL CARE IF:  You have pain in your arms, neck, jaw, teeth, or back.  You feel sweaty, dizzy, or light-headed.  You have chest pain or shortness of breath.  You vomit and your vomit looks like blood or coffee grounds.  You faint.  Your stool is bloody or black.  You cannot swallow, drink, or eat.   This information is not intended to replace advice given to you by your health care provider. Make sure you discuss any questions you have with your health care provider.   Diverticulosis Diverticulosis is the condition that develops when small pouches (diverticula) form in the wall of your colon. Your colon, or large intestine, is where water is absorbed and stool is formed. The pouches form when the inside layer of your colon pushes through weak spots in the outer layers of your colon. CAUSES  No one knows exactly what causes diverticulosis. RISK FACTORS  Being older than 32. Your risk for this condition increases with age. Diverticulosis is rare in people younger than 40 years. By age 33, almost everyone has it.  Eating a low-fiber  diet.  Being frequently constipated.  Being overweight.  Not getting enough exercise.  Smoking.  Taking over-the-counter pain medicines, like aspirin and ibuprofen. SYMPTOMS  Most people with diverticulosis do not have symptoms. DIAGNOSIS  Because diverticulosis often has no symptoms, health care providers often discover the condition during an exam for other colon problems.  In many cases, a health care provider will diagnose diverticulosis while using a flexible scope to examine the colon (colonoscopy). TREATMENT  If you have never developed an infection related to diverticulosis, you may not need treatment. If you have had an infection before, treatment may include:  Eating more fruits, vegetables, and grains.  Taking a fiber supplement.  Taking a live bacteria supplement (probiotic).  Taking medicine to relax your colon. HOME CARE INSTRUCTIONS   Drink at least 6-8 glasses of water each day to prevent constipation.  Try not to strain when you have a bowel movement.  Keep all follow-up appointments. If you have had an infection before:  Increase the fiber in your diet as directed by your health care provider or dietitian.  Take a dietary fiber supplement if your health care provider approves.  Only take medicines as directed by your health care provider. SEEK MEDICAL CARE IF:   You have abdominal pain.  You have bloating.  You have cramps.  You have not gone to the bathroom in 3 days. SEEK IMMEDIATE MEDICAL CARE IF:   Your pain gets worse.  Yourbloating becomes very bad.  You have a fever or chills, and your symptoms suddenly get worse.  You begin vomiting.  You have bowel movements that are bloody or black. MAKE SURE YOU:  Understand these instructions.  Will watch your condition.  Will get help right away if you are not doing well or get worse.   This information is not intended to replace advice given to you by your health care provider. Make  sure you discuss any questions you have with your health care provider.

## 2016-02-02 ENCOUNTER — Encounter: Payer: Self-pay | Admitting: Internal Medicine

## 2016-02-02 ENCOUNTER — Telehealth: Payer: Self-pay

## 2016-02-02 NOTE — Telephone Encounter (Signed)
Per RMR-   Send letter to patient.  Send copy of letter with path to referring provider and PCP. I sent a staff message 2 days ago to Dr. [name] about a nodule in the base of this patient's throat. He has not gotten back with me.  Patient has been seen by him before. He needs to be seen over there again ASAP. Please make patient an appointment to see Dr. [name].

## 2016-02-03 ENCOUNTER — Encounter (HOSPITAL_COMMUNITY): Payer: Self-pay | Admitting: Internal Medicine

## 2016-02-03 NOTE — Telephone Encounter (Signed)
Called Dr. Danie Binder office and pt has appt with him on 02/10/2016 @ 1:20pm. Called pt and [facility] regarding appt

## 2016-02-10 DIAGNOSIS — K219 Gastro-esophageal reflux disease without esophagitis: Secondary | ICD-10-CM | POA: Insufficient documentation

## 2016-03-31 ENCOUNTER — Encounter: Payer: Medicare Other | Admitting: Internal Medicine

## 2016-05-01 ENCOUNTER — Ambulatory Visit (INDEPENDENT_AMBULATORY_CARE_PROVIDER_SITE_OTHER): Payer: Medicare Other | Admitting: Gastroenterology

## 2016-05-01 ENCOUNTER — Encounter: Payer: Self-pay | Admitting: Gastroenterology

## 2016-05-01 VITALS — BP 145/75 | HR 74 | Temp 97.4°F | Ht 68.5 in | Wt 206.4 lb

## 2016-05-01 DIAGNOSIS — Z86010 Personal history of colonic polyps: Secondary | ICD-10-CM

## 2016-05-01 DIAGNOSIS — K2100 Gastro-esophageal reflux disease with esophagitis, without bleeding: Secondary | ICD-10-CM

## 2016-05-01 DIAGNOSIS — K862 Cyst of pancreas: Secondary | ICD-10-CM

## 2016-05-01 NOTE — Progress Notes (Signed)
      Primary Care Physician: Bryan Dawson, MD  Primary Gastroenterologist:  Bryan Cornea, MD   Chief Complaint  Patient presents with  . Follow-up    doing well    HPI: Bryan Cabrera is a 75 y.o. male here for follow up. Seen in 01/2016 for EGD/TCS for upper abd pain. See 01/04/16 note for details of ER evaluation. Subsequently had MRI pancreas to evaluate abnormality seen on CT. Appears to have benign pancreatic cysts but this will be followed with one year f/u MRI.  On EGD, he had LA Grade B esophagitis, gastritis, medium sized polypoid mass on right arytenoid. Colonoscopy with multiple adenomatous colon polyps, diverticulosis. Next TCS in 3 years. Saw Dr. [name] for abnormality noted on the EGD, ENT performed fiberoptic laryngoscopy and suspected LPR.   Patient took Protonix BID for about one month, then went down to once daily. More recently using prn only. Denies heartburn. Has made some dietary changes. No abdominal pain. No melena, brbpr, constipation, diarrhea. Feels well.    Current Outpatient Prescriptions  Medication Sig Dispense Refill  . valsartan-hydrochlorothiazide (DIOVAN-HCT) 320-12.5 MG tablet TAKE ONE (1) TABLET EACH DAY 30 tablet 10   No current facility-administered medications for this visit.    Allergies as of 05/01/2016 - Review Complete 05/01/2016  Allergen Reaction Noted  . Penicillins  08/14/2007  . Quinapril hcl  10/22/2006  . Tetracycline hcl  10/22/2006  . Amoxicillin Other (See Comments) 10/22/2006    ROS:  General: Negative for anorexia, weight loss, fever, chills, fatigue, weakness. ENT: Negative for hoarseness, difficulty swallowing , nasal congestion. CV: Negative for chest pain, angina, palpitations, dyspnea on exertion, peripheral edema.  Respiratory: Negative for dyspnea at rest, dyspnea on exertion, cough, sputum, wheezing.  GI: See history of present illness. GU:  Negative for dysuria, hematuria, urinary incontinence,  urinary frequency, nocturnal urination.  Endo: Negative for unusual weight change.    Physical Examination:   BP 145/75 mmHg  Pulse 74  Temp(Src) 97.4 F (36.3 C) (Oral)  Ht 5' 8.5" (1.74 m)  Wt 206 lb 6.4 oz (93.622 kg)  BMI 30.92 kg/m2  General: Well-nourished, well-developed in no acute distress.  Eyes: No icterus. Mouth: Oropharyngeal mucosa moist and pink , no lesions erythema or exudate. Lungs: Clear to auscultation bilaterally.  Heart: Regular rate and rhythm, no murmurs rubs or gallops.  Abdomen: Bowel sounds are normal, nontender, nondistended, no hepatosplenomegaly or masses, no abdominal bruits or hernia , no rebound or guarding.   Extremities: No lower extremity edema. No clubbing or deformities. Neuro: Alert and oriented x 4   Skin: Warm and dry, no jaundice.   Psych: Alert and cooperative, normal mood and affect.  Labs:  Lab Results  Component Value Date   WBC 6.6 12/08/2015   HGB 15.9 12/08/2015   HCT 44.8 12/08/2015   MCV 83.9 12/08/2015   PLT 190 12/08/2015   Lab Results  Component Value Date   CREATININE 1.01 12/08/2015   BUN 18 12/08/2015   NA 140 12/08/2015   K 3.5 12/08/2015   CL 106 12/08/2015   CO2 25 12/08/2015   Lab Results  Component Value Date   ALT 26 12/08/2015   AST 24 12/08/2015   ALKPHOS 53 12/08/2015   BILITOT 0.4 12/08/2015     Imaging Studies: No results found.

## 2016-05-01 NOTE — Assessment & Plan Note (Signed)
75 y/o male with reflux esophagitis and gastritis on EGD. Doing well. Taking PPI prn without recurrent symptoms. Patient with LPR on ENT evaluation. Discussed signs/symptoms to look for. If develops recurrent reflux like symptoms 3-4 times weekly, then would advise going back on daily PPI. Return to the office prn.

## 2016-05-01 NOTE — Assessment & Plan Note (Signed)
MRI findings most c/w benign findings but MRI 12/2016 to verify stability recommended by radiologist.

## 2016-05-01 NOTE — Assessment & Plan Note (Signed)
Due for surveillance colonoscopy in 01/2019.

## 2016-05-01 NOTE — Patient Instructions (Signed)
1. Continue pantoprazole as needed. 2. Return to the office as needed or call with any questions.  3. We will contact you in 2020 when you are due for your next colonoscopy.

## 2016-05-02 NOTE — Progress Notes (Signed)
CC'ED TO PCP 

## 2016-06-22 ENCOUNTER — Ambulatory Visit (INDEPENDENT_AMBULATORY_CARE_PROVIDER_SITE_OTHER)
Admission: RE | Admit: 2016-06-22 | Discharge: 2016-06-22 | Disposition: A | Discharge status: Home / Self Care | Payer: Medicare Other | Source: Ambulatory Visit | Attending: Internal Medicine | Admitting: Internal Medicine

## 2016-06-22 ENCOUNTER — Ambulatory Visit (INDEPENDENT_AMBULATORY_CARE_PROVIDER_SITE_OTHER): Payer: Medicare Other | Admitting: Internal Medicine

## 2016-06-22 ENCOUNTER — Encounter: Payer: Self-pay | Admitting: Internal Medicine

## 2016-06-22 VITALS — BP 160/72 | Temp 97.6°F | Ht 68.5 in | Wt 208.6 lb

## 2016-06-22 DIAGNOSIS — M79672 Pain in left foot: Secondary | ICD-10-CM | POA: Diagnosis not present

## 2016-06-22 DIAGNOSIS — M10072 Idiopathic gout, left ankle and foot: Secondary | ICD-10-CM | POA: Diagnosis not present

## 2016-06-22 MED ORDER — PREDNISONE 10 MG PO TABS: ORAL_TABLET | ORAL | 0 refills | Status: DC

## 2016-06-22 NOTE — Patient Instructions (Addendum)
I agree this is probably gout .  Get x ray today  As planned for reasons stated.  Plan prednisone to dec pain and inflammation   .   Plan lab work in a 3-4 weeks to include  Uric acid level, chemistry and blood count.   Any diuretic can increase  Uric acid but so can other  Foods etc .   Depending on results  If getting recurrent sx consideration of medication suppression .    Gout Gout is an inflammatory arthritis caused by a buildup of uric acid crystals in the joints. Uric acid is a chemical that is normally present in the blood. When the level of uric acid in the blood is too high it can form crystals that deposit in your joints and tissues. This causes joint redness, soreness, and swelling (inflammation). Repeat attacks are common. Over time, uric acid crystals can form into masses (tophi) near a joint, destroying bone and causing disfigurement. Gout is treatable and often preventable. CAUSES  The disease begins with elevated levels of uric acid in the blood. Uric acid is produced by your body when it breaks down a naturally found substance called purines. Certain foods you eat, such as meats and fish, contain high amounts of purines. Causes of an elevated uric acid level include:  Being passed down from parent to child (heredity).  Diseases that cause increased uric acid production (such as obesity, psoriasis, and certain cancers).  Excessive alcohol use.  Diet, especially diets rich in meat and seafood.  Medicines, including certain cancer-fighting medicines (chemotherapy), water pills (diuretics), and aspirin.  Chronic kidney disease. The kidneys are no longer able to remove uric acid well.  Problems with metabolism. Conditions strongly associated with gout include:  Obesity.  High blood pressure.  High cholesterol.  Diabetes. Not everyone with elevated uric acid levels gets gout. It is not understood why some people get gout and others do not. Surgery, joint injury, and  eating too much of certain foods are some of the factors that can lead to gout attacks. SYMPTOMS   An attack of gout comes on quickly. It causes intense pain with redness, swelling, and warmth in a joint.  Fever can occur.  Often, only one joint is involved. Certain joints are more commonly involved:  Base of the big toe.  Knee.  Ankle.  Wrist.  Finger. Without treatment, an attack usually goes away in a few days to weeks. Between attacks, you usually will not have symptoms, which is different from many other forms of arthritis. DIAGNOSIS  Your caregiver will suspect gout based on your symptoms and exam. In some cases, tests may be recommended. The tests may include:  Blood tests.  Urine tests.  X-rays.  Joint fluid exam. This exam requires a needle to remove fluid from the joint (arthrocentesis). Using a microscope, gout is confirmed when uric acid crystals are seen in the joint fluid. TREATMENT  There are two phases to gout treatment: treating the sudden onset (acute) attack and preventing attacks (prophylaxis).  Treatment of an Acute Attack.  Medicines are used. These include anti-inflammatory medicines or steroid medicines.  An injection of steroid medicine into the affected joint is sometimes necessary.  The painful joint is rested. Movement can worsen the arthritis.  You may use warm or cold treatments on painful joints, depending which works best for you.  Treatment to Prevent Attacks.  If you suffer from frequent gout attacks, your caregiver may advise preventive medicine. These medicines are started  after the acute attack subsides. These medicines either help your kidneys eliminate uric acid from your body or decrease your uric acid production. You may need to stay on these medicines for a very long time.  The early phase of treatment with preventive medicine can be associated with an increase in acute gout attacks. For this reason, during the first few months  of treatment, your caregiver may also advise you to take medicines usually used for acute gout treatment. Be sure you understand your caregiver's directions. Your caregiver may make several adjustments to your medicine dose before these medicines are effective.  Discuss dietary treatment with your caregiver or dietitian. Alcohol and drinks high in sugar and fructose and foods such as meat, poultry, and seafood can increase uric acid levels. Your caregiver or dietitian can advise you on drinks and foods that should be limited. HOME CARE INSTRUCTIONS   Do not take aspirin to relieve pain. This raises uric acid levels.  Only take over-the-counter or prescription medicines for pain, discomfort, or fever as directed by your caregiver.  Rest the joint as much as possible. When in bed, keep sheets and blankets off painful areas.  Keep the affected joint raised (elevated).  Apply warm or cold treatments to painful joints. Use of warm or cold treatments depends on which works best for you.  Use crutches if the painful joint is in your leg.  Drink enough fluids to keep your urine clear or pale yellow. This helps your body get rid of uric acid. Limit alcohol, sugary drinks, and fructose drinks.  Follow your dietary instructions. Pay careful attention to the amount of protein you eat. Your daily diet should emphasize fruits, vegetables, whole grains, and fat-free or low-fat milk products. Discuss the use of coffee, vitamin C, and cherries with your caregiver or dietitian. These may be helpful in lowering uric acid levels.  Maintain a healthy body weight. SEEK MEDICAL CARE IF:   You develop diarrhea, vomiting, or any side effects from medicines.  You do not feel better in 24 hours, or you are getting worse. SEEK IMMEDIATE MEDICAL CARE IF:   Your joint becomes suddenly more tender, and you have chills or a fever. MAKE SURE YOU:   Understand these instructions.  Will watch your condition.  Will  get help right away if you are not doing well or get worse.   This information is not intended to replace advice given to you by your health care provider. Make sure you discuss any questions you have with your health care provider.   Document Released: 09/29/2000 Document Revised: 10/23/2014 Document Reviewed: 05/15/2012 Elsevier Interactive Patient Education [employer].

## 2016-06-22 NOTE — Progress Notes (Signed)
Pre visit review using our clinic review tool, if applicable. No additional management support is needed unless otherwise documented below in the visit note. 
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
## 2016-07-12 IMAGING — MR MR HEAD W/O CM
10 series · 48 of 48 positions shown · non-contrast
Comparison: 05/12/2015 CT head at [facility].

CLINICAL DATA: Episodic chills, sweating, vomiting, nausea and
dizziness with vertigo. History of hypertension. History of
hyperglycemia.

EXAM:
MRI HEAD WITHOUT CONTRAST
TECHNIQUE: Multiplanar, multiecho pulse sequences of the brain and surrounding
structures were obtained without intravenous contrast.

[Series 5: T1 · sagittal · 4.0mm · 0.75mm/px · 3 of 29 slices shown (1 of 2)]
[im 1/29]
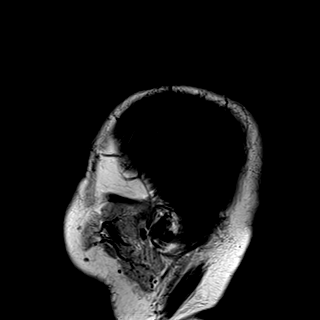
[im 15/29]
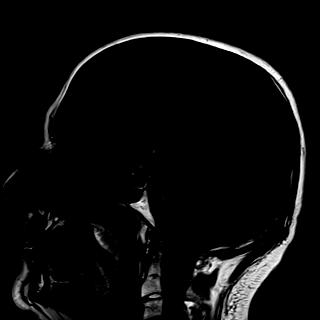
[im 29/29]
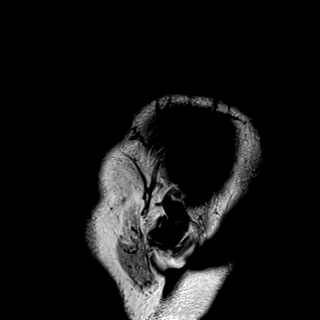

[Series 6: DWI · axial · 3.0mm · 1.44mm/px · z∈[-85,+67]mm · 8 of 94 slices shown (1 of 4)]
[im 1/94]
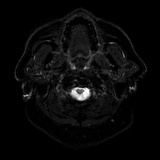
[im 14/94]
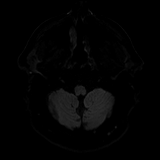
[im 27/94]
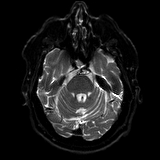
[im 40/94]
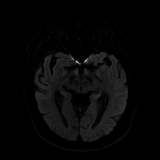
[im 54/94]
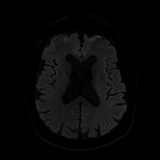
[im 67/94]
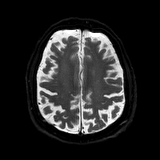
[im 80/94]
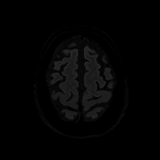
[im 94/94]
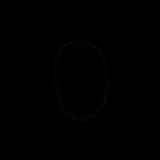

[Series 7: DWI · axial · 3.0mm · 1.44mm/px · z∈[-85,+67]mm · 4 of 47 slices shown (2 of 4)]
[im 1/47]
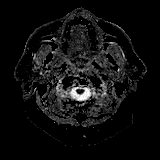
[im 16/47]
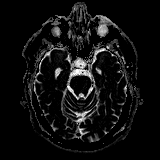
[im 31/47]
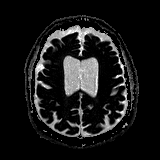
[im 47/47]
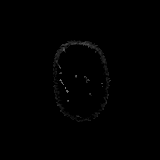

[Series 8: DWI · coronal · 5.0mm · 1.44mm/px · 5 of 60 slices shown (3 of 4)]
[im 1/60]
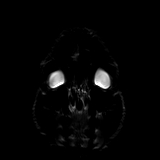
[im 15/60]
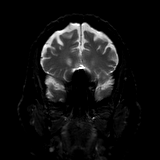
[im 30/60]
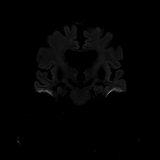
[im 45/60]
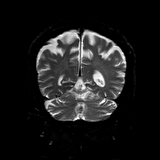
[im 60/60]
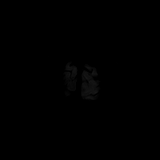

[Series 9: DWI · coronal · 5.0mm · 1.44mm/px · 2 of 30 slices shown (4 of 4)]
[im 1/30]
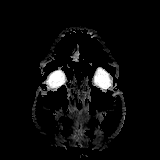
[im 30/30]
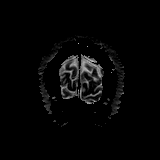

[Series 10: T2 · axial · 4.0mm · 0.36mm/px · z∈[-79,+71]mm · 2 of 30 slices shown (1 of 2)]
[im 1/30]
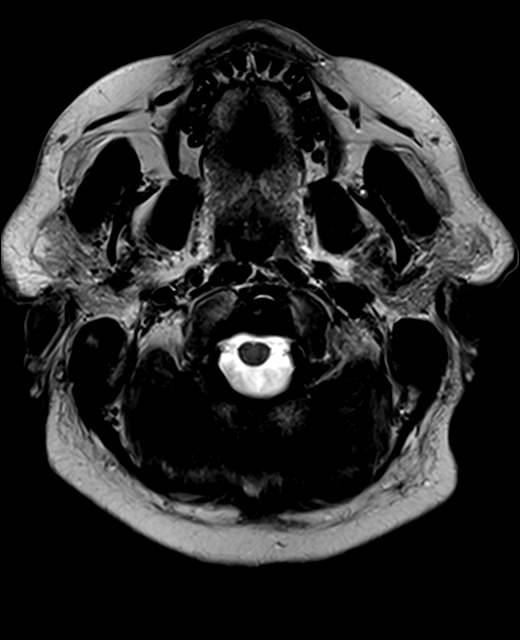
[im 30/30]
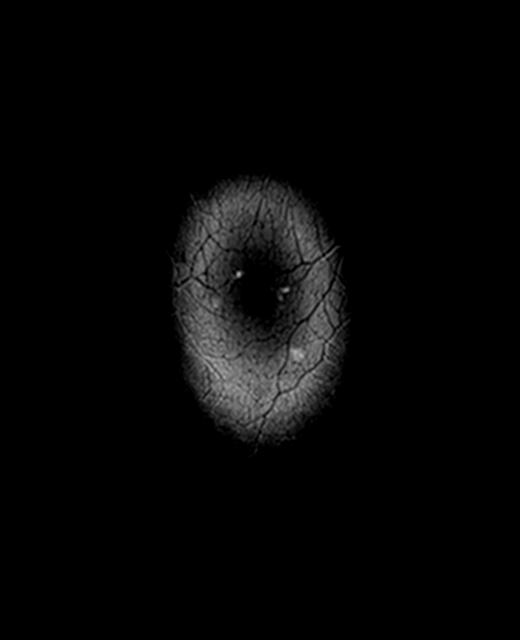

[Series 11: FLAIR · axial · 4.0mm · 0.72mm/px · z∈[-79,+71]mm · 2 of 30 slices shown]
[im 1/30]
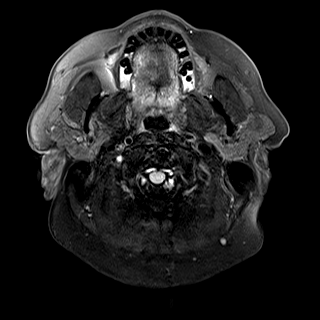
[im 30/30]
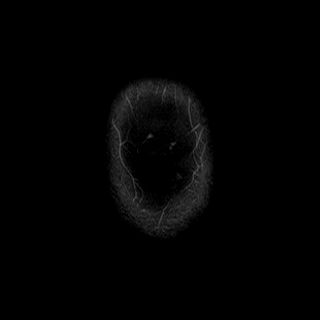

[Series 13: swi_images · axial · 1.5mm · 0.90mm/px · z∈[-75,+67]mm · 8 of 96 slices shown]
[im 1/96]
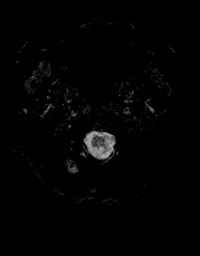
[im 14/96]
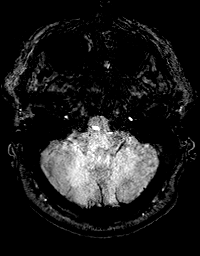
[im 28/96]
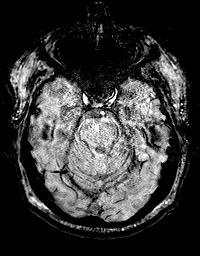
[im 41/96]
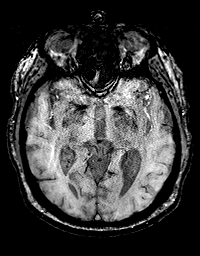
[im 55/96]
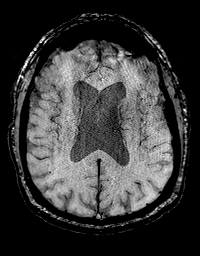
[im 68/96]
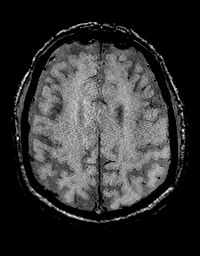
[im 82/96]
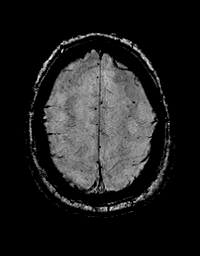
[im 96/96]
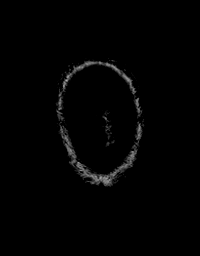

[Series 14: T1 · axial · 1.0mm · 0.90mm/px · z∈[-75,+68]mm · 12 of 144 slices shown (2 of 2)]
[im 1/144]
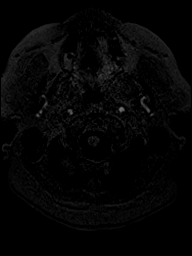
[im 14/144]
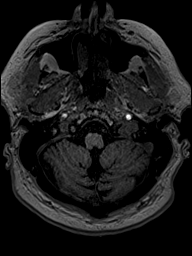
[im 27/144]
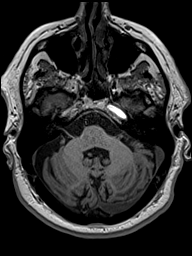
[im 40/144]
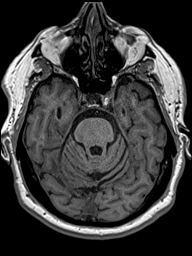
[im 53/144]
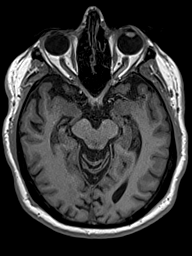
[im 66/144]
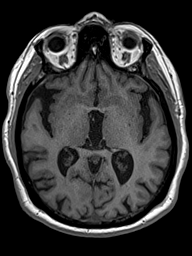
[im 79/144]
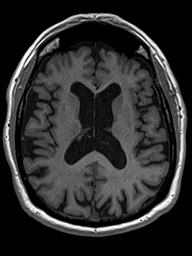
[im 92/144]
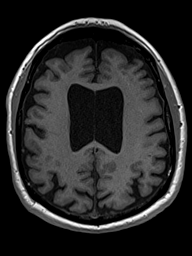
[im 105/144]
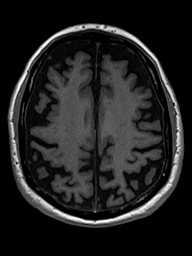
[im 118/144]
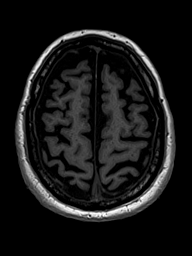
[im 131/144]
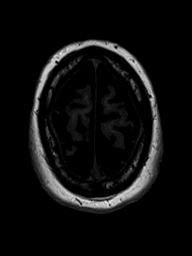
[im 144/144]
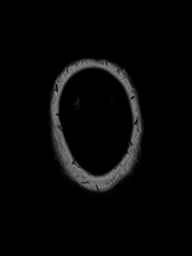

[Series 15: T2 · coronal · 4.5mm · 0.36mm/px · 2 of 30 slices shown (2 of 2)]
[im 1/30]
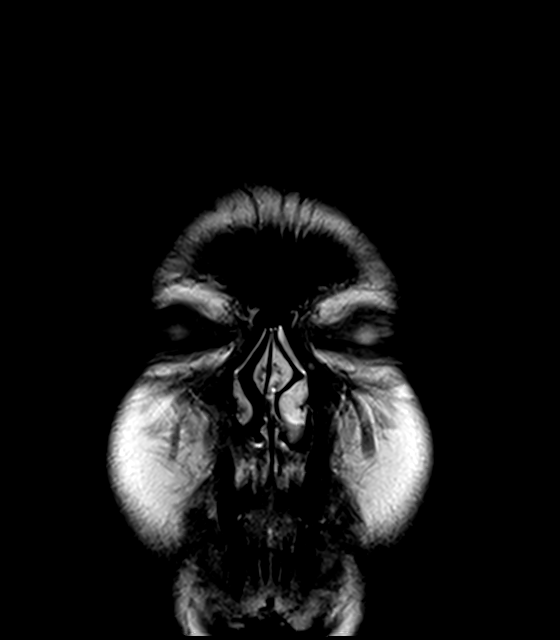
[im 30/30]
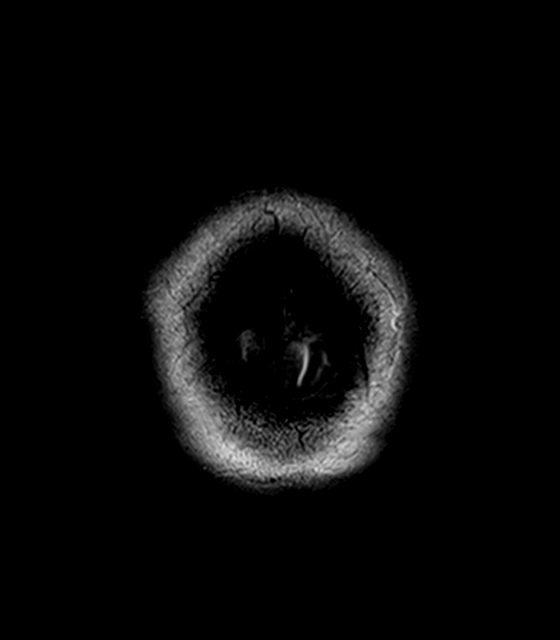

[48 of 48 positions shown; findings below may reference images not displayed]

FINDINGS: Global atrophy. Hydrocephalus ex vacuo. No acute stroke, hemorrhage,
mass lesion, or extra-axial fluid. No white matter disease of
significance. Partial empty sella. No tonsillar herniation. Mild
pannus without visible cervical cord displacement. Flow voids are
maintained, with LEFT vertebral dominant. No chronic hemorrhage.
Negative osseous structures, orbits, sinuses, and mastoids.
Unchanged appearance compared with recent CT.
IMPRESSION: Global atrophy. No acute intracranial findings. Partial empty sella.

No features to suggest acute or chronic vertebrobasilar
insufficiency.

## 2016-07-20 ENCOUNTER — Other Ambulatory Visit (INDEPENDENT_AMBULATORY_CARE_PROVIDER_SITE_OTHER): Payer: Medicare Other

## 2016-07-20 DIAGNOSIS — I10 Essential (primary) hypertension: Secondary | ICD-10-CM | POA: Diagnosis not present

## 2016-07-20 LAB — BASIC METABOLIC PANEL
BUN: 18 mg/dL (ref 6–23)
CHLORIDE: 106 meq/L (ref 96–112)
CO2: 25 mEq/L (ref 19–32)
CREATININE: 0.97 mg/dL (ref 0.40–1.50)
Calcium: 9.5 mg/dL (ref 8.4–10.5)
GFR: 79.96 mL/min (ref >60.00)
Glucose, Bld: 136 mg/dL — ABNORMAL HIGH (ref 70–99)
Potassium: 3.8 mEq/L (ref 3.5–5.1)
Sodium: 141 mEq/L (ref 135–145)

## 2016-07-20 LAB — URIC ACID: URIC ACID, SERUM: 8.5 mg/dL — AB (ref 4.0–7.8)

## 2016-12-12 ENCOUNTER — Other Ambulatory Visit: Payer: Self-pay | Admitting: Internal Medicine

## 2016-12-13 NOTE — Telephone Encounter (Signed)
Sent refill into the pharmacy   

## 2016-12-20 ENCOUNTER — Other Ambulatory Visit: Payer: Medicare Other

## 2016-12-26 ENCOUNTER — Encounter: Payer: Medicare Other | Admitting: Internal Medicine
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
## 2017-03-19 ENCOUNTER — Encounter: Payer: Self-pay | Admitting: Internal Medicine

## 2017-03-19 ENCOUNTER — Ambulatory Visit (INDEPENDENT_AMBULATORY_CARE_PROVIDER_SITE_OTHER): Payer: Medicare Other | Admitting: Internal Medicine

## 2017-03-19 VITALS — BP 104/60 | HR 91 | Temp 97.8°F | Ht 68.5 in | Wt 208.4 lb

## 2017-03-19 DIAGNOSIS — I10 Essential (primary) hypertension: Secondary | ICD-10-CM | POA: Diagnosis not present

## 2017-03-19 DIAGNOSIS — R7301 Impaired fasting glucose: Secondary | ICD-10-CM

## 2017-03-19 DIAGNOSIS — Z2821 Immunization not carried out because of patient refusal: Secondary | ICD-10-CM

## 2017-03-19 DIAGNOSIS — Z8739 Personal history of other diseases of the musculoskeletal system and connective tissue: Secondary | ICD-10-CM

## 2017-03-19 DIAGNOSIS — W57XXXA Bitten or stung by nonvenomous insect and other nonvenomous arthropods, initial encounter: Secondary | ICD-10-CM

## 2017-03-19 DIAGNOSIS — E785 Hyperlipidemia, unspecified: Secondary | ICD-10-CM

## 2017-03-19 DIAGNOSIS — Z0000 Encounter for general adult medical examination without abnormal findings: Secondary | ICD-10-CM

## 2017-03-19 DIAGNOSIS — H9192 Unspecified hearing loss, left ear: Secondary | ICD-10-CM

## 2017-03-19 DIAGNOSIS — Z79899 Other long term (current) drug therapy: Secondary | ICD-10-CM | POA: Diagnosis not present

## 2017-03-19 NOTE — Patient Instructions (Addendum)
Plan fasting labs when convenient for you  Will get  chomistry cholesterol and blood sugar parameters.   Glad you are doing well.  Your hearing is indeed dow on the left  consdier seeing audiologist  If needed      Hearing Screening   125Hz  250Hz  500Hz  1000Hz  2000Hz  3000Hz  4000Hz  6000Hz  8000Hz   Right ear:   25 30 30  35 30 45   Left ear:   25 35 40 70 80 74      Health Maintenance, Male A healthy lifestyle and preventive care is important for your health and wellness. Ask your health care provider about what schedule of regular examinations is right for you. What should I know about weight and diet? Eat a Healthy Diet  Eat plenty of vegetables, fruits, whole grains, low-fat dairy products, and lean protein.  Do not eat a lot of foods high in solid fats, added sugars, or salt.  Maintain a Healthy Weight Regular exercise can help you achieve or maintain a healthy weight. You should:  Do at least 150 minutes of exercise each week. The exercise should increase your heart rate and make you sweat (moderate-intensity exercise).  Do strength-training exercises at least twice a week.  Watch Your Levels of Cholesterol and Blood Lipids  Have your blood tested for lipids and cholesterol every 5 years starting at 77 years of age. If you are at high risk for heart disease, you should start having your blood tested when you are 77 years old. You may need to have your cholesterol levels checked more often if: ? Your lipid or cholesterol levels are high. ? You are older than 77 years of age. ? You are at high risk for heart disease.  What should I know about cancer screening? Many types of cancers can be detected early and may often be prevented. Lung Cancer  You should be screened every year for lung cancer if: ? You are a current smoker who has smoked for at least 30 years. ? You are a former smoker who has quit within the past 15 years.  Talk to your health care provider about your  screening options, when you should start screening, and how often you should be screened.  Colorectal Cancer  Routine colorectal cancer screening usually begins at 77 years of age and should be repeated every 5-10 years until you are 77 years old. You may need to be screened more often if early forms of precancerous polyps or small growths are found. Your health care provider may recommend screening at an earlier age if you have risk factors for colon cancer.  Your health care provider may recommend using home test kits to check for hidden blood in the stool.  A small camera at the end of a tube can be used to examine your colon (sigmoidoscopy or colonoscopy). This checks for the earliest forms of colorectal cancer.  Prostate and Testicular Cancer  Depending on your age and overall health, your health care provider may do certain tests to screen for prostate and testicular cancer.  Talk to your health care provider about any symptoms or concerns you have about testicular or prostate cancer.  Skin Cancer  Check your skin from head to toe regularly.  Tell your health care provider about any new moles or changes in moles, especially if: ? There is a change in a mole's size, shape, or color. ? You have a mole that is larger than a pencil eraser.  Always use sunscreen. Apply  sunscreen liberally and repeat throughout the day.  Protect yourself by wearing long sleeves, pants, a wide-brimmed hat, and sunglasses when outside.  What should I know about heart disease, diabetes, and high blood pressure?  If you are 85-63 years of age, have your blood pressure checked every 3-5 years. If you are 48 years of age or older, have your blood pressure checked every year. You should have your blood pressure measured twice-once when you are at a hospital or clinic, and once when you are not at a hospital or clinic. Record the average of the two measurements. To check your blood pressure when you are not at  a hospital or clinic, you can use: ? An automated blood pressure machine at a pharmacy. ? A home blood pressure monitor.  Talk to your health care provider about your target blood pressure.  If you are between 24-26 years old, ask your health care provider if you should take aspirin to prevent heart disease.  Have regular diabetes screenings by checking your fasting blood sugar level. ? If you are at a normal weight and have a low risk for diabetes, have this test once every three years after the age of 76. ? If you are overweight and have a high risk for diabetes, consider being tested at a younger age or more often.  A one-time screening for abdominal aortic aneurysm (AAA) by ultrasound is recommended for men aged 14-75 years who are current or former smokers. What should I know about preventing infection? Hepatitis B If you have a higher risk for hepatitis B, you should be screened for this virus. Talk with your health care provider to find out if you are at risk for hepatitis B infection. Hepatitis C Blood testing is recommended for:  Everyone born from [birth year] through 1966.  Anyone with known risk factors for hepatitis C.  Sexually Transmitted Diseases (STDs)  You should be screened each year for STDs including gonorrhea and chlamydia if: ? You are sexually active and are younger than 77 years of age. ? You are older than 77 years of age and your health care provider tells you that you are at risk for this type of infection. ? Your sexual activity has changed since you were last screened and you are at an increased risk for chlamydia or gonorrhea. Ask your health care provider if you are at risk.  Talk with your health care provider about whether you are at high risk of being infected with HIV. Your health care provider may recommend a prescription medicine to help prevent HIV infection.  What else can I do?  Schedule regular health, dental, and eye exams.  Stay current with  your vaccines (immunizations).  Do not use any tobacco products, such as cigarettes, chewing tobacco, and e-cigarettes. If you need help quitting, ask your health care provider.  Limit alcohol intake to no more than 2 drinks per day. One drink equals 12 ounces of beer, 5 ounces of wine, or 1 ounces of hard liquor.  Do not use street drugs.  Do not share needles.  Ask your health care provider for help if you need support or information about quitting drugs.  Tell your health care provider if you often feel depressed.  Tell your health care provider if you have ever been abused or do not feel safe at home. This information is not intended to replace advice given to you by your health care provider. Make sure you discuss any questions you have with  your health care provider. Document Released: 03/30/2008 Document Revised: 05/31/2016 Document Reviewed: 07/06/2015 Elsevier Interactive Patient Education  [employer].
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
## 2017-04-11 NOTE — Progress Notes (Signed)
Pre visit review using our clinic review tool, if applicable. No additional management support is needed unless otherwise documented below in the visit note. 

## 2017-04-12 ENCOUNTER — Other Ambulatory Visit: Payer: Self-pay | Admitting: Internal Medicine

## 2017-04-13 ENCOUNTER — Other Ambulatory Visit: Payer: Medicare Other

## 2017-04-13 ENCOUNTER — Other Ambulatory Visit (INDEPENDENT_AMBULATORY_CARE_PROVIDER_SITE_OTHER): Payer: Medicare Other

## 2017-04-13 VITALS — BP 130/78 | HR 80 | Resp 16 | Ht 69.0 in | Wt 208.5 lb

## 2017-04-13 DIAGNOSIS — Z79899 Other long term (current) drug therapy: Secondary | ICD-10-CM

## 2017-04-13 DIAGNOSIS — R7301 Impaired fasting glucose: Secondary | ICD-10-CM | POA: Diagnosis not present

## 2017-04-13 DIAGNOSIS — I10 Essential (primary) hypertension: Secondary | ICD-10-CM | POA: Diagnosis not present

## 2017-04-13 DIAGNOSIS — Z0000 Encounter for general adult medical examination without abnormal findings: Secondary | ICD-10-CM

## 2017-04-13 DIAGNOSIS — Z8739 Personal history of other diseases of the musculoskeletal system and connective tissue: Secondary | ICD-10-CM

## 2017-04-13 DIAGNOSIS — E785 Hyperlipidemia, unspecified: Secondary | ICD-10-CM | POA: Diagnosis not present

## 2017-04-13 LAB — BASIC METABOLIC PANEL
BUN: 16 mg/dL (ref 6–23)
CHLORIDE: 108 meq/L (ref 96–112)
CO2: 26 meq/L (ref 19–32)
Calcium: 10.1 mg/dL (ref 8.4–10.5)
Creatinine, Ser: 0.99 mg/dL (ref 0.40–1.50)
GFR: 77.94 mL/min (ref >60.00)
Glucose, Bld: 110 mg/dL — ABNORMAL HIGH (ref 70–99)
POTASSIUM: 4 meq/L (ref 3.5–5.1)
Sodium: 142 mEq/L (ref 135–145)

## 2017-04-13 LAB — LIPID PANEL
CHOLESTEROL: 147 mg/dL (ref 0–200)
HDL: 37.3 mg/dL — ABNORMAL LOW (ref >39.00)
LDL Cholesterol: 80 mg/dL (ref 0–99)
NonHDL: 109.85
Total CHOL/HDL Ratio: 4
Triglycerides: 147 mg/dL (ref 0.0–149.0)
VLDL: 29.4 mg/dL (ref 0.0–40.0)

## 2017-04-13 LAB — CBC WITH DIFFERENTIAL/PLATELET
BASOS PCT: 0.9 % (ref 0.0–3.0)
Basophils Absolute: 0.1 10*3/uL (ref 0.0–0.1)
Eosinophils Absolute: 0.2 10*3/uL (ref 0.0–0.7)
Eosinophils Relative: 3.9 % (ref 0.0–5.0)
HEMATOCRIT: 43.4 % (ref 39.0–52.0)
HEMOGLOBIN: 15.1 g/dL (ref 13.0–17.0)
LYMPHS PCT: 29.2 % (ref 12.0–46.0)
Lymphs Abs: 1.9 10*3/uL (ref 0.7–4.0)
MCHC: 34.9 g/dL (ref 30.0–36.0)
MCV: 83 fl (ref 78.0–100.0)
MONO ABS: 0.6 10*3/uL (ref 0.1–1.0)
Monocytes Relative: 9.4 % (ref 3.0–12.0)
Neutro Abs: 3.7 10*3/uL (ref 1.4–7.7)
Neutrophils Relative %: 56.6 % (ref 43.0–77.0)
Platelets: 230 10*3/uL (ref 150.0–400.0)
RBC: 5.23 Mil/uL (ref 4.22–5.81)
RDW: 13.2 % (ref 11.5–15.5)
WBC: 6.4 10*3/uL (ref 4.0–10.5)

## 2017-04-13 LAB — HEPATIC FUNCTION PANEL
ALBUMIN: 4.1 g/dL (ref 3.5–5.2)
ALT: 15 U/L (ref 0–53)
AST: 15 U/L (ref 0–37)
Alkaline Phosphatase: 49 U/L (ref 39–117)
Bilirubin, Direct: 0.1 mg/dL (ref 0.0–0.3)
Total Bilirubin: 0.7 mg/dL (ref 0.2–1.2)
Total Protein: 6.7 g/dL (ref 6.0–8.3)

## 2017-04-13 LAB — URIC ACID: URIC ACID, SERUM: 8.1 mg/dL — AB (ref 4.0–7.8)

## 2017-04-13 LAB — HEMOGLOBIN A1C: Hgb A1c MFr Bld: 5.8 % (ref 4.6–6.5)

## 2017-04-13 LAB — PSA: PSA: 1.43 ng/mL (ref 0.10–4.00)

## 2017-04-13 NOTE — Patient Instructions (Signed)
Bring a copy of your advance directives to your next office visit.   Preventive Care 77 Years and Older, Male Preventive care refers to lifestyle choices and visits with your health care provider that can promote health and wellness. What does preventive care include?  A yearly physical exam. This is also called an annual well check.  Dental exams once or twice a year.  Routine eye exams. Ask your health care provider how often you should have your eyes checked.  Personal lifestyle choices, including:  Daily care of your teeth and gums.  Regular physical activity.  Eating a healthy diet.  Avoiding tobacco and drug use.  Limiting alcohol use.  Practicing safe sex.  Taking low doses of aspirin every day.  Taking vitamin and mineral supplements as recommended by your health care provider. What happens during an annual well check? The services and screenings done by your health care provider during your annual well check will depend on your age, overall health, lifestyle risk factors, and family history of disease. Counseling  Your health care provider may ask you questions about your:  Alcohol use.  Tobacco use.  Drug use.  Emotional well-being.  Home and relationship well-being.  Sexual activity.  Eating habits.  History of falls.  Memory and ability to understand (cognition).  Work and work environment. Screening  You may have the following tests or measurements:  Height, weight, and BMI.  Blood pressure.  Lipid and cholesterol levels. These may be checked every 5 years, or more frequently if you are over 50 years old.  Skin check.  Lung cancer screening. You may have this screening every year starting at age 55 if you have a 30-pack-year history of smoking and currently smoke or have quit within the past 15 years.  Fecal occult blood test (FOBT) of the stool. You may have this test every year starting at age 50.  Flexible sigmoidoscopy or  colonoscopy. You may have a sigmoidoscopy every 5 years or a colonoscopy every 10 years starting at age 50.  Prostate cancer screening. Recommendations will vary depending on your family history and other risks.  Hepatitis C blood test.  Hepatitis B blood test.  Sexually transmitted disease (STD) testing.  Diabetes screening. This is done by checking your blood sugar (glucose) after you have not eaten for a while (fasting). You may have this done every 1-3 years.  Abdominal aortic aneurysm (AAA) screening. You may need this if you are a current or former smoker.  Osteoporosis. You may be screened starting at age 70 if you are at high risk. Talk with your health care provider about your test results, treatment options, and if necessary, the need for more tests. Vaccines  Your health care provider may recommend certain vaccines, such as:  Influenza vaccine. This is recommended every year.  Tetanus, diphtheria, and acellular pertussis (Tdap, Td) vaccine. You may need a Td booster every 10 years.  Varicella vaccine. You may need this if you have not been vaccinated.  Zoster vaccine. You may need this after age 60.  Measles, mumps, and rubella (MMR) vaccine. You may need at least one dose of MMR if you were born in 1957 or later. You may also need a second dose.  Pneumococcal 13-valent conjugate (PCV13) vaccine. One dose is recommended after age 77.  Pneumococcal polysaccharide (PPSV23) vaccine. One dose is recommended after age 77.  Meningococcal vaccine. You may need this if you have certain conditions.  Hepatitis A vaccine. You may need this   if you have certain conditions or if you travel or work in places where you may be exposed to hepatitis A.  Hepatitis B vaccine. You may need this if you have certain conditions or if you travel or work in places where you may be exposed to hepatitis B.  Haemophilus influenzae type b (Hib) vaccine. You may need this if you have certain risk  factors. Talk to your health care provider about which screenings and vaccines you need and how often you need them. This information is not intended to replace advice given to you by your health care provider. Make sure you discuss any questions you have with your health care provider. Document Released: 10/29/2015 Document Revised: 06/21/2016 Document Reviewed: 08/03/2015 Elsevier Interactive Patient Education  2017 Elsevier Inc.  

## 2017-05-08 ENCOUNTER — Other Ambulatory Visit: Payer: Self-pay | Admitting: Internal Medicine

## 2017-05-09 ENCOUNTER — Other Ambulatory Visit: Payer: Self-pay | Admitting: Emergency Medicine

## 2017-05-09 ENCOUNTER — Telehealth: Payer: Self-pay | Admitting: Internal Medicine

## 2017-05-09 MED ORDER — TELMISARTAN-HCTZ 80-12.5 MG PO TABS: 1.0000 | ORAL_TABLET | Freq: Every day | ORAL | 0 refills | Status: DC

## 2017-05-09 NOTE — Telephone Encounter (Signed)
Can change to tlemisartaqn hctz 80/12.5   Disp 90 and let us know how bp control is going after that

## 2017-05-09 NOTE — Telephone Encounter (Signed)
Pharmacy states the valsartan-hydrochlorothiazide (DIOVAN-HCT) 320-12.5 MG tablet  Has been recalled and removed from the shelves. Will need to switch to something else.  [place], [place]

## 2017-05-09 NOTE — Telephone Encounter (Signed)
New medication has been sent in

## 2017-05-09 NOTE — Telephone Encounter (Signed)
Spoke with patient is aware of the new medication and to f/u up in 3 months to let Dr. Regis Bill know how his blood pressure is doing

## 2017-05-09 NOTE — Telephone Encounter (Signed)
FYI

## 2017-05-09 NOTE — Telephone Encounter (Signed)
Pharmacy is calling concerning below msg and state that the pt is going out of town and would like to see if he could get it today before he leave.  Pt is out of the medication as well.

## 2017-05-09 NOTE — Telephone Encounter (Signed)
Please advise 

## 2017-05-28 ENCOUNTER — Telehealth: Payer: Self-pay | Admitting: Internal Medicine

## 2017-05-28 NOTE — Telephone Encounter (Signed)
Patient states that blood pressure is running 171/90. Patient states that he has been feeling dizzy, nauseated, and having headaches. Patient wants to see if he can take Benicare instead since he has taken this in the past.

## 2017-05-28 NOTE — Telephone Encounter (Signed)
Pt state that the Rx telmisartan-hydrochlorothiazide Bp is still rising and feel very nauseated in the early morning when he gets up.

## 2017-05-28 NOTE — Telephone Encounter (Signed)
Left a VM for patient to give the office a call back regarding blood pressure medication. Spoke with Dr. Sarajane Jews about blood pressure readings and how patient has been feeling and he states that either the dosage of the current blood pressure medication can be increased or he can change the medication to Losartan which is usually covered by insurance. Patient inquired about Benicare which insurance is less likely to cover.

## 2017-05-29 NOTE — Telephone Encounter (Signed)
Attempted to contact patient. VM is not set up will call back later

## 2017-05-31 NOTE — Telephone Encounter (Signed)
Spoke with patient regarding medication change and pt states that at this time he would not like to switch or increase the dosage. He does not feel nauseated anymore and his last blood pressure reading was 136/65 P 65. Patient will give the office a call back if anything changes.

## 2017-06-19 ENCOUNTER — Telehealth: Payer: Self-pay | Admitting: Internal Medicine

## 2017-06-19 NOTE — Telephone Encounter (Signed)
Pt is calling stating that the telmisartan is not working for him his Bb is staying around ?170/90 today.  Pt state that he would like to have generic for [facility] and would like to see if he could get it today to start on it tonight.  Pharm:  [place]

## 2017-06-20 MED ORDER — OLMESARTAN MEDOXOMIL-HCTZ 40-12.5 MG PO TABS: 1.0000 | ORAL_TABLET | Freq: Every day | ORAL | 3 refills | Status: DC

## 2017-06-20 NOTE — Telephone Encounter (Signed)
Sent in med

## 2017-06-20 NOTE — Telephone Encounter (Addendum)
Left message on machine to call back to pt.  Dr. Regis Bill please see previous message and advise

## 2017-06-20 NOTE — Telephone Encounter (Signed)
Spoke with pt and informed him of the message per Dr. Regis Bill. Pt had no additional questions at this time. Nothing further is needed

## 2017-06-20 NOTE — Telephone Encounter (Signed)
Pt returned my call and I informed him that as soon as Dr. Regis Bill has an opportunity she will take a look at his message. Pt states he took his BP an hour ago and it is around 158 over 90 now.

## 2017-10-18 ENCOUNTER — Other Ambulatory Visit: Payer: Self-pay | Admitting: Internal Medicine
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
## 2017-11-26 ENCOUNTER — Encounter: Payer: Self-pay | Admitting: Internal Medicine

## 2017-11-26 ENCOUNTER — Ambulatory Visit: Payer: Medicare Other | Admitting: Internal Medicine

## 2017-11-26 VITALS — BP 162/92 | HR 93 | Temp 97.6°F | Wt 212.6 lb

## 2017-11-26 DIAGNOSIS — L304 Erythema intertrigo: Secondary | ICD-10-CM

## 2017-11-26 DIAGNOSIS — I10 Essential (primary) hypertension: Secondary | ICD-10-CM | POA: Diagnosis not present

## 2017-11-26 DIAGNOSIS — Z79899 Other long term (current) drug therapy: Secondary | ICD-10-CM

## 2017-11-26 MED ORDER — KETOCONAZOLE 2 % EX CREA: 1.0000 "application " | TOPICAL_CREAM | Freq: Two times a day (BID) | CUTANEOUS | 0 refills | Status: DC

## 2017-11-26 NOTE — Patient Instructions (Addendum)
This is intertrigo .  Keep as dry as possible  Can try zeosorb or  Similar  To maintain  Improvement,  Will sill send in  nizoral cream  For the next 2 weeks  Can add 1% hydrocortisone if  Needed for itching   In regard to blood pressure    Consider   benicar  40/25   Or add amlodipine  5 mg  ( a CCB)   Other is candasartan .  Or telmisartan    Or ibesartan With hctz  edarbyclor is  azilsartan and chlorthalidone (a different   Diuretic) 40/12.5  May not be a generic but may  May be more  Powerful in some people.   If you BP not coming down with increase activity then conctact [place] for  Medication intensification trial.       Intertrigo Intertrigo is skin irritation or inflammation (dermatitis) that occurs when folds of skin rub together. The irritation can cause a rash and make skin raw and itchy. This condition most commonly occurs in the skin folds of these areas:  Toes.  Armpits.  Groin.  Belly.  Breasts.  Buttocks.  Intertrigo is not passed from person to person (is not contagious). What are the causes? This condition is caused by heat, moisture, friction, and lack of air circulation. The condition can be made worse by:  Sweat.  Bacteria or a fungus, such as yeast.  What increases the risk? This condition is more likely to occur if you have moisture in your skin folds. It is also more likely to develop in people who:  Have diabetes.  Are overweight.  Are on bed rest.  Live in a warm and moist climate.  Wear splints, braces, or other medical devices.  Are not able to control their bowels or bladder (have incontinence).  What are the signs or symptoms? Symptoms of this condition include:  A pink or red skin rash.  Brown patches on the skin.  Raw or scaly skin.  Itchiness.  A burning feeling.  Bleeding.  Leaking fluid.  A bad smell.  How is this diagnosed? This condition is diagnosed with a medical history and physical exam. You may also have  a skin swab to test for bacteria or a fungus, such as yeast. How is this treated? Treatment may include:  Cleaning and drying your skin.  An oral antibiotic medicine or antibiotic skin cream for a bacterial infection.  Antifungal cream or pills for an infection that was caused by a fungus, such as yeast.  Steroid ointment to relieve itchiness and irritation.  Follow these instructions at home:  Keep the affected area clean and dry.  Do not scratch your skin.  Stay in a cool environment as much as possible. Use an air conditioner or fan, if available.  Apply over-the-counter and prescription medicines only as told by your health care provider.  If you were prescribed an antibiotic medicine, use it as told by your health care provider. Do not stop using the antibiotic even if your condition improves.  Keep all follow-up visits as told by your health care provider. This is important. How is this prevented?  Maintain a healthy weight.  Take care of your feet, especially if you have diabetes. Foot care includes: ? Wearing shoes that fit well. ? Keeping your feet dry. ? Wearing clean, breathable socks.  Protect the skin around your groin and buttocks, especially if you have incontinence. Skin protection includes: ? Following a regular cleaning routine. ?  Using moisturizers and skin protectants. ? Changing protection pads frequently.  Do not wear tight clothes. Wear clothes that are loose and absorbent. Wear clothes that are made of cotton.  Wear a bra that gives good support, if needed.  Shower and dry yourself thoroughly after activity. Use a hair dryer on a cool setting to dry between skin folds, especially after you bathe.  If you have diabetes, keep your blood sugar under control. Contact a health care provider if:  Your symptoms do not improve with treatment.  Your symptoms get worse or they spread.  You notice increased redness and warmth.  You have a  fever. This information is not intended to replace advice given to you by your health care provider. Make sure you discuss any questions you have with your health care provider. Document Released: 10/02/2005 Document Revised: 03/09/2016 Document Reviewed: 04/05/2015 Elsevier Interactive Patient Education  2018 [employer].

## 2017-12-11 ENCOUNTER — Telehealth: Payer: Self-pay | Admitting: Internal Medicine

## 2017-12-11 MED ORDER — OLMESARTAN MEDOXOMIL-HCTZ 40-25 MG PO TABS: 1.0000 | ORAL_TABLET | Freq: Every day | ORAL | 3 refills | Status: DC

## 2017-12-11 NOTE — Telephone Encounter (Signed)
Copied from [place]. Topic: Inquiry >> Dec 11, 2017  9:05 AM Conception Chancy, NT wrote: Patient is calling and states he has decided that he would like Dr.Panosh to increase HCTZ in his BP medication.

## 2017-12-11 NOTE — Telephone Encounter (Signed)
Per 11/26/2017 OV  In regard to blood pressure   Consider   benicar  40/25   Or add amlodipine  5 mg  ( a CCB)   Other is candasartan .  Or telmisartan    Or ibesartan With hctz  edarbyclor is  azilsartan and chlorthalidone (a different   Diuretic) 40/12.5  May not be a generic but may  May be more  Powerful in some people.   If you BP not coming down with increase activity then conctact [place] for  Medication intensification trial.   Please advise Dr Regis Bill. Pt is requesting med change. Thanks.

## 2017-12-11 NOTE — Telephone Encounter (Signed)
Ok    To do benicar hctz 40 /25   I sent in  Medication  To his pharmacy  If all ok then  we  Can follow up  At his cpx   Due in June sometime.

## 2017-12-12 ENCOUNTER — Telehealth: Payer: Self-pay | Admitting: Internal Medicine

## 2017-12-12 NOTE — Telephone Encounter (Signed)
Copied from [place] [id number]. Topic: Quick Communication - Rx Refill/Question >> Dec 12, 2017  9:01 AM Robina Ade, Helene Kelp D wrote: Medication: olmesartan-hydrochlorothiazide (BENICAR HCT) 40-25 MG tablet  switch to something else since its been recalled. Example like Losartan   Has the patient contacted their pharmacy?Yes, but need med change due to recall.   (Agent: If no, request that the patient contact the pharmacy for the refill.)   Preferred Pharmacy (with phone number or street name): [place]   Agent: Please be advised that RX refills may take up to 3 business days. We ask that you follow-up with your pharmacy.

## 2017-12-13 NOTE — Telephone Encounter (Signed)
Need more info   What  ARB thiazide combinations  Are on his formulary and available?  candisartan  hctz Telmisartin?  hctz

## 2017-12-13 NOTE — Telephone Encounter (Signed)
Please advise Dr Panosh, thanks.   

## 2017-12-14 MED ORDER — OLMESARTAN MEDOXOMIL 40 MG PO TABS: 40.0000 mg | ORAL_TABLET | Freq: Every day | ORAL | 0 refills | Status: DC

## 2017-12-14 MED ORDER — HYDROCHLOROTHIAZIDE 25 MG PO TABS
25.0000 mg | ORAL_TABLET | Freq: Every day | ORAL | 0 refills | Status: DC
Start: 2017-12-14 — End: 2018-02-15

## 2017-12-14 NOTE — Telephone Encounter (Signed)
Medications filled to pharmacy as requested.  

## 2017-12-14 NOTE — Telephone Encounter (Signed)
This has been taken care of. See other TE where patient was switched to Olmesartan and HCTZ meds. This has been called in. Nothing further needed.

## 2017-12-14 NOTE — Telephone Encounter (Signed)
Spoke with Juanda Crumble at [facility] - aware that we are sending Rxs for the equiv Benicar 40-25mg  Pt aware that we are sending these Rx in. Nothing further needed.

## 2017-12-14 NOTE — Telephone Encounter (Signed)
Patient calling back to inform that his insurance will pay for Benicar HCTZ 40-25mg .

## 2017-12-14 NOTE — Telephone Encounter (Signed)
[facility] called stating they do not have Benicar. Patient would like to separate medications into olmesartan 40 mg and HCTZ 25 mg.   Please advise if okay to split and I will send new rxs and cancel previous script.

## 2017-12-14 NOTE — Telephone Encounter (Signed)
Yes please do this

## 2017-12-14 NOTE — Telephone Encounter (Signed)
Pt returned call. I did advise pt to contact insurance to see what they will cover (formulary) pt says that he will call them and give our office a call back.

## 2018-01-29 ENCOUNTER — Ambulatory Visit: Payer: Self-pay | Admitting: *Deleted

## 2018-01-29 ENCOUNTER — Ambulatory Visit: Payer: Medicare Other | Admitting: Internal Medicine

## 2018-01-29 ENCOUNTER — Encounter: Payer: Self-pay | Admitting: Internal Medicine

## 2018-01-29 VITALS — BP 110/68 | HR 77 | Temp 98.1°F | Wt 209.3 lb

## 2018-01-29 DIAGNOSIS — I480 Paroxysmal atrial fibrillation: Secondary | ICD-10-CM | POA: Insufficient documentation

## 2018-01-29 DIAGNOSIS — Z79899 Other long term (current) drug therapy: Secondary | ICD-10-CM | POA: Diagnosis not present

## 2018-01-29 DIAGNOSIS — I4891 Unspecified atrial fibrillation: Secondary | ICD-10-CM | POA: Diagnosis not present

## 2018-01-29 DIAGNOSIS — I10 Essential (primary) hypertension: Secondary | ICD-10-CM

## 2018-01-29 DIAGNOSIS — I499 Cardiac arrhythmia, unspecified: Secondary | ICD-10-CM | POA: Diagnosis not present

## 2018-01-29 DIAGNOSIS — I4821 Permanent atrial fibrillation: Secondary | ICD-10-CM | POA: Insufficient documentation

## 2018-01-29 DIAGNOSIS — R7301 Impaired fasting glucose: Secondary | ICD-10-CM
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
## 2018-01-29 NOTE — Telephone Encounter (Signed)
Pt called stating that his "bottom number is high and can't seem to get it to go down"; this has been going on since his medication was changed due to recall; it was 148/105 when he got up about 0730; at 0930 148/92; these were taken on his left forearm with an automatic cuff; he also states that his medication was changed in February due to recall; recommendations made per nurse triage to include seeing a physician within 3 days; pt offered and accepted appointment with Dr Regis Bill, [facility] today at [phone]; he verbalizes understanding; will route to office for notification of this upcoming appointment.   Reason for Disposition . Systolic BP  >= 462 OR Diastolic >= 703  Answer Assessment - Initial Assessment Questions 1. BLOOD PRESSURE: "What is the blood pressure?" "Did you take at least two measurements 5 minutes apart?"     148/105 and 148/92 2. ONSET: "When did you take your blood pressure?"     0730 and 0930 3. HOW: "How did you obtain the blood pressure?" (e.g., visiting nurse, automatic home BP monitor)     Automatic cuff 4. HISTORY: "Do you have a history of high blood pressure?"     yes 5. MEDICATIONS: "Are you taking any medications for blood pressure?" "Have you missed any doses recently?"     Yes, benicar HCTZ 25 mg 6. OTHER SYMPTOMS: "Do you have any symptoms?" (e.g., headache, chest pain, blurred vision, difficulty breathing, weakness)     Periodic concentrated pain on right side of head near ear/up the side 7. PREGNANCY: "Is there any chance you are pregnant?" "When was your last menstrual period?"     n/a  Protocols used: HIGH BLOOD PRESSURE-A-AH

## 2018-01-29 NOTE — Patient Instructions (Addendum)
Your bp readings are good today   .   Some irreg heart beat.   Readings as poss a fibrillation .but rate is good . And no symptoms   If you get   Faint  Rapid heart rate   Or short of breath then seek immedidate care  Otherwise we will   Do further evaluation  Plan blood work  And    Echo test and  Get cardiology to see you . Marland Kitchen This is a common  Situation   And sometimes medicine can treat this       Atrial Fibrillation Atrial fibrillation is a type of irregular or rapid heartbeat (arrhythmia). In atrial fibrillation, the heart quivers continuously in a chaotic pattern. This occurs when parts of the heart receive disorganized signals that make the heart unable to pump blood normally. This can increase the risk for stroke, heart failure, and other heart-related conditions. There are different types of atrial fibrillation, including:  Paroxysmal atrial fibrillation. This type starts suddenly, and it usually stops on its own shortly after it starts.  Persistent atrial fibrillation. This type often lasts longer than a week. It may stop on its own or with treatment.  Long-lasting persistent atrial fibrillation. This type lasts longer than 12 months.  Permanent atrial fibrillation. This type does not go away.  Talk with your health care provider to learn about the type of atrial fibrillation that you have. What are the causes? This condition is caused by some heart-related conditions or procedures, including:  A heart attack.  Coronary artery disease.  Heart failure.  Heart valve conditions.  High blood pressure.  Inflammation of the sac that surrounds the heart (pericarditis).  Heart surgery.  Certain heart rhythm disorders, such as Wolf-Parkinson-White syndrome.  Other causes include:  Pneumonia.  Obstructive sleep apnea.  Blockage of an artery in the lungs (pulmonary embolism, or PE).  Lung cancer.  Chronic lung disease.  Thyroid problems, especially if the thyroid  is overactive (hyperthyroidism).  Caffeine.  Excessive alcohol use or illegal drug use.  Use of some medicines, including certain decongestants and diet pills.  Sometimes, the cause cannot be found. What increases the risk? This condition is more likely to develop in:  People who are older in age.  People who smoke.  People who have diabetes mellitus.  People who are overweight (obese).  Athletes who exercise vigorously.  What are the signs or symptoms? Symptoms of this condition include:  A feeling that your heart is beating rapidly or irregularly.  A feeling of discomfort or pain in your chest.  Shortness of breath.  Sudden light-headedness or weakness.  Getting tired easily during exercise.  In some cases, there are no symptoms. How is this diagnosed? Your health care provider may be able to detect atrial fibrillation when taking your pulse. If detected, this condition may be diagnosed with:  An electrocardiogram (ECG).  A Holter monitor test that records your heartbeat patterns over a 24-hour period.  Transthoracic echocardiogram (TTE) to evaluate how blood flows through your heart.  Transesophageal echocardiogram (TEE) to view more detailed images of your heart.  A stress test.  Imaging tests, such as a CT scan or chest X-ray.  Blood tests.  How is this treated? The main goals of treatment are to prevent blood clots from forming and to keep your heart beating at a normal rate and rhythm. The type of treatment that you receive depends on many factors, such as your underlying medical conditions and how  you feel when you are experiencing atrial fibrillation. This condition may be treated with:  Medicine to slow down the heart rate, bring the heart's rhythm back to normal, or prevent clots from forming.  Electrical cardioversion. This is a procedure that resets your heart's rhythm by delivering a controlled, low-energy shock to the heart through your  skin.  Different types of ablation, such as catheter ablation, catheter ablation with pacemaker, or surgical ablation. These procedures destroy the heart tissues that send abnormal signals. When the pacemaker is used, it is placed under your skin to help your heart beat in a regular rhythm.  Follow these instructions at home:  Take over-the counter and prescription medicines only as told by your health care provider.  If your health care provider prescribed a blood-thinning medicine (anticoagulant), take it exactly as told. Taking too much blood-thinning medicine can cause bleeding. If you do not take enough blood-thinning medicine, you will not have the protection that you need against stroke and other problems.  Do not use tobacco products, including cigarettes, chewing tobacco, and e-cigarettes. If you need help quitting, ask your health care provider.  If you have obstructive sleep apnea, manage your condition as told by your health care provider.  Do not drink alcohol.  Do not drink beverages that contain caffeine, such as coffee, soda, and tea.  Maintain a healthy weight. Do not use diet pills unless your health care provider approves. Diet pills may make heart problems worse.  Follow diet instructions as told by your health care provider.  Exercise regularly as told by your health care provider.  Keep all follow-up visits as told by your health care provider. This is important. How is this prevented?  Avoid drinking beverages that contain caffeine or alcohol.  Avoid certain medicines, especially medicines that are used for breathing problems.  Avoid certain herbs and herbal medicines, such as those that contain ephedra or ginseng.  Do not use illegal drugs, such as cocaine and amphetamines.  Do not smoke.  Manage your high blood pressure. Contact a health care provider if:  You notice a change in the rate, rhythm, or strength of your heartbeat.  You are taking an  anticoagulant and you notice increased bruising.  You tire more easily when you exercise or exert yourself. Get help right away if:  You have chest pain, abdominal pain, sweating, or weakness.  You feel nauseous.  You notice blood in your vomit, bowel movement, or urine.  You have shortness of breath.  You suddenly have swollen feet and ankles.  You feel dizzy.  You have sudden weakness or numbness of the face, arm, or leg, especially on one side of the body.  You have trouble speaking, trouble understanding, or both (aphasia).  Your face or your eyelid droops on one side. These symptoms may represent a serious problem that is an emergency. Do not wait to see if the symptoms will go away. Get medical help right away. Call your local emergency services (911 in the [place] Do not drive yourself to the hospital. This information is not intended to replace advice given to you by your health care provider. Make sure you discuss any questions you have with your health care provider. Document Released: 10/02/2005 Document Revised: 02/09/2016 Document Reviewed: 01/27/2015 Elsevier Interactive Patient Education  [employer].

## 2018-01-31 ENCOUNTER — Ambulatory Visit (HOSPITAL_COMMUNITY): Payer: Medicare Other | Attending: Cardiology

## 2018-01-31 ENCOUNTER — Other Ambulatory Visit: Payer: Self-pay

## 2018-01-31 ENCOUNTER — Other Ambulatory Visit (INDEPENDENT_AMBULATORY_CARE_PROVIDER_SITE_OTHER): Payer: Medicare Other

## 2018-01-31 DIAGNOSIS — I10 Essential (primary) hypertension: Secondary | ICD-10-CM | POA: Diagnosis not present

## 2018-01-31 DIAGNOSIS — I499 Cardiac arrhythmia, unspecified: Secondary | ICD-10-CM

## 2018-01-31 DIAGNOSIS — Z79899 Other long term (current) drug therapy: Secondary | ICD-10-CM | POA: Insufficient documentation

## 2018-01-31 DIAGNOSIS — I4891 Unspecified atrial fibrillation: Secondary | ICD-10-CM

## 2018-01-31 DIAGNOSIS — E785 Hyperlipidemia, unspecified: Secondary | ICD-10-CM | POA: Diagnosis not present

## 2018-01-31 DIAGNOSIS — R7301 Impaired fasting glucose: Secondary | ICD-10-CM

## 2018-01-31 DIAGNOSIS — G4733 Obstructive sleep apnea (adult) (pediatric): Secondary | ICD-10-CM | POA: Diagnosis not present

## 2018-01-31 LAB — LIPID PANEL
CHOLESTEROL: 158 mg/dL (ref 0–200)
HDL: 37.9 mg/dL — ABNORMAL LOW (ref >39.00)
LDL Cholesterol: 81 mg/dL (ref 0–99)
NONHDL: 120.3
TRIGLYCERIDES: 199 mg/dL — AB (ref 0.0–149.0)
Total CHOL/HDL Ratio: 4
VLDL: 39.8 mg/dL (ref 0.0–40.0)

## 2018-01-31 LAB — T4, FREE: Free T4: 0.73 ng/dL (ref 0.60–1.60)

## 2018-01-31 LAB — HEPATIC FUNCTION PANEL
ALBUMIN: 4.1 g/dL (ref 3.5–5.2)
ALT: 15 U/L (ref 0–53)
AST: 16 U/L (ref 0–37)
Alkaline Phosphatase: 46 U/L (ref 39–117)
Bilirubin, Direct: 0.1 mg/dL (ref 0.0–0.3)
TOTAL PROTEIN: 6.5 g/dL (ref 6.0–8.3)
Total Bilirubin: 0.5 mg/dL (ref 0.2–1.2)

## 2018-01-31 LAB — CBC WITH DIFFERENTIAL/PLATELET
BASOS ABS: 0 10*3/uL (ref 0.0–0.1)
Basophils Relative: 0.7 % (ref 0.0–3.0)
Eosinophils Absolute: 0.3 10*3/uL (ref 0.0–0.7)
Eosinophils Relative: 4.1 % (ref 0.0–5.0)
HEMATOCRIT: 44.3 % (ref 39.0–52.0)
Hemoglobin: 15.1 g/dL (ref 13.0–17.0)
LYMPHS PCT: 30.8 % (ref 12.0–46.0)
Lymphs Abs: 1.9 10*3/uL (ref 0.7–4.0)
MCHC: 34.2 g/dL (ref 30.0–36.0)
MCV: 84.2 fl (ref 78.0–100.0)
MONOS PCT: 8.5 % (ref 3.0–12.0)
Monocytes Absolute: 0.5 10*3/uL (ref 0.1–1.0)
NEUTROS ABS: 3.4 10*3/uL (ref 1.4–7.7)
Neutrophils Relative %: 55.9 % (ref 43.0–77.0)
Platelets: 222 10*3/uL (ref 150.0–400.0)
RBC: 5.26 Mil/uL (ref 4.22–5.81)
RDW: 13.2 % (ref 11.5–15.5)
WBC: 6.1 10*3/uL (ref 4.0–10.5)

## 2018-01-31 LAB — BASIC METABOLIC PANEL
BUN: 20 mg/dL (ref 6–23)
CALCIUM: 9.9 mg/dL (ref 8.4–10.5)
CO2: 28 meq/L (ref 19–32)
Chloride: 105 mEq/L (ref 96–112)
Creatinine, Ser: 1.12 mg/dL (ref 0.40–1.50)
GFR: 67.46 mL/min (ref >60.00)
Glucose, Bld: 101 mg/dL — ABNORMAL HIGH (ref 70–99)
Potassium: 3.7 mEq/L (ref 3.5–5.1)
SODIUM: 139 meq/L (ref 135–145)

## 2018-01-31 LAB — HEMOGLOBIN A1C: Hgb A1c MFr Bld: 5.9 % (ref 4.6–6.5)

## 2018-01-31 LAB — TSH: TSH: 3.4 u[IU]/mL (ref 0.35–4.50)

## 2018-02-01 ENCOUNTER — Other Ambulatory Visit (HOSPITAL_COMMUNITY): Payer: Medicare Other

## 2018-02-07 ENCOUNTER — Ambulatory Visit: Payer: Self-pay | Admitting: *Deleted

## 2018-02-07 NOTE — Telephone Encounter (Signed)
I just called to follow up with patient, he declined to go to ER, has appointment next week with cardiology. Pt said he got a call from this office earlier about lab results, not sure why he didn't mention this triage conversation when he was called today.

## 2018-02-07 NOTE — Telephone Encounter (Signed)
Pt reports passed out this am getting out of the shower; unwitnessed. States severe nausea just prior to episode, no emesis. States "Just blacked out for a few seconds."  H/O a-fib. Denies CP, chest tightness, SOB. Reports chills, "sweats" and sinus drainage since Sunday, clear. "Just have felt bad for awhile." HR during call 64; unable to check B/P "Machine is not working correctly." States right shoulder "sore" from fall. Directed pt to ED. Pt states he has someone to drive him.         Reason for Disposition . [1] Age > 50 years  AND [2] now alert and feels fine  Answer Assessment - Initial Assessment Questions 1. ONSET: "How long were you unconscious?" (minutes) "When did it happen?"     Few seconds 2. CONTENT: "What happened during period of unconsciousness?" (e.g., seizure activity)      Not witnessed 3. MENTAL STATUS: "Alert and oriented now?" (oriented x 3 = name, month, location)      yes 4. TRIGGER: "What do you think caused the fainting?" "What were you doing just before you fainted?"  (e.g., exercise, sudden standing up, prolonged standing)     Nausea prior to passing out 5. RECURRENT SYMPTOM: "Have you ever passed out before?" If so, ask: "When was the last time?" and "What happened that time?"      Yes, unsure of cause 6. INJURY: "Did you sustain any injury during the fall?"      Right shoulder "sore" 7. CARDIAC SYMPTOMS: "Have you had any of the following symptoms: chest pain, difficulty breathing, palpitations?"     NO 8. NEUROLOGIC SYMPTOMS: "Have you had any of the following symptoms: headache, numbness, vertigo, weakness?"     no 9. GI SYMPTOMS: "Have you had any of the following symptoms: abdominal pain, vomiting, diarrhea, blood in stools?"     Nausea 10. OTHER SYMPTOMS: "Do you have any other symptoms?"      Sinus pressure, clear drainage, chills, sweats since Sunday  Protocols used: [facility]

## 2018-02-07 NOTE — Telephone Encounter (Signed)
Monitor for ER arrival 

## 2018-02-07 NOTE — Telephone Encounter (Signed)
I spoke with pt again and he would like to see a provider here tomorrow for sinus congestion/dull headache and coughing. Dr. Regis Bill did not have any 30 minute opening, pt scheduled to see Dr. Volanda Napoleon, pt requesting afternoon appointment. I asked if he was alone or if someone was with him, yes someone there. I went over with him that he was advised to go to ER earlier because of symptoms of passing out, he declined.

## 2018-02-07 NOTE — Telephone Encounter (Signed)
   Answer Assessment - Initial Assessment Questions 1. ONSET: "How long were you unconscious?" (minutes) "When did it happen?"     Few seconds 2. CONTENT: "What happened during period of unconsciousness?" (e.g., seizure activity)      unsure 3. MENTAL STATUS: "Alert and oriented now?" (oriented x 3 = name, month, location)      yes 4. TRIGGER: "What do you think caused the fainting?" "What were you doing just before you fainted?"  (e.g., exercise, sudden standing up, prolonged standing)     Nauseated prior to passing out 5. RECURRENT SYMPTOM: "Have you ever passed out before?" If so, ask: "When was the last time?" and "What happened that time?"      Yes, not sure 6. INJURY: "Did you sustain any injury during the fall?"     Right shoulder sore 7. CARDIAC SYMPTOMS: "Have you had any of the following symptoms: chest pain, difficulty breathing, palpitations?"     Sinus pressure 8. NEUROLOGIC SYMPTOMS: "Have you had any of the following symptoms: headache, numbness, vertigo, weakness?"     no 9. GI SYMPTOMS: "Have you had any of the following symptoms: abdominal pain, vomiting, diarrhea, blood in stools?"     no 10. OTHER SYMPTOMS: "Do you have any other symptoms?"       Sinus drainage, chills, sweating  Protocols used: [facility]

## 2018-02-07 NOTE — Telephone Encounter (Signed)
I printed a copy of this note and gave to both Dr. Regis Bill & Dr. Volanda Napoleon for review.

## 2018-02-08 ENCOUNTER — Encounter: Payer: Self-pay | Admitting: Internal Medicine

## 2018-02-08 ENCOUNTER — Ambulatory Visit: Payer: Medicare Other | Admitting: Internal Medicine

## 2018-02-08 ENCOUNTER — Ambulatory Visit: Payer: Medicare Other | Admitting: Family Medicine

## 2018-02-08 VITALS — BP 122/62 | HR 78 | Temp 98.2°F | Wt 207.7 lb

## 2018-02-08 DIAGNOSIS — R55 Syncope and collapse: Secondary | ICD-10-CM | POA: Insufficient documentation

## 2018-02-08 DIAGNOSIS — J101 Influenza due to other identified influenza virus with other respiratory manifestations: Secondary | ICD-10-CM | POA: Diagnosis not present

## 2018-02-08 DIAGNOSIS — I4891 Unspecified atrial fibrillation: Secondary | ICD-10-CM | POA: Diagnosis not present

## 2018-02-08 DIAGNOSIS — I493 Ventricular premature depolarization: Secondary | ICD-10-CM

## 2018-02-08 DIAGNOSIS — J22 Unspecified acute lower respiratory infection: Secondary | ICD-10-CM | POA: Diagnosis not present

## 2018-02-08 LAB — POC INFLUENZA A&B (BINAX/QUICKVUE)
INFLUENZA A, POC: POSITIVE — AB
INFLUENZA B, POC: NEGATIVE

## 2018-02-08 NOTE — Patient Instructions (Addendum)
This acts like a viral respiratory infection  Flu like   And the tests  A   positive .   And the faint may have been from that      Exam is reassuring  At this time   Ekg shows  Same     AF with some pvcs   Atrial fibrillation and problem can be associated with dizziness also and needs  Immediate  Evaluation if happens again .  Stay hydrated and for now    Hold the   hctz until feeling better   Still advise anticoagulation    Such as eliquis.   No decongestants     Tylenol  Ok and saline  flonase   Afrin if needed.     Seek  Emergency care     If   Fainting    Again  Or rapid heart rate .      Premature Ventricular Contraction A premature ventricular contraction (PVC) is a common irregularity in the normal heart rhythm. These contractions are extra heartbeats that start in the heart ventricles and occur too early in the normal sequence. During the PVC, the heart's normal electrical pathway is not used, so the beat is shorter and less effective. In most cases, these contractions come and go and do not require treatment. What are the causes? In many cases, the cause may not be known. Common causes of the condition include:  Smoking.  Drinking alcohol.  Caffeine.  Certain medicines.  Some illegal drugs.  Stress.  Certain medical conditions can also cause PVCs:  Changes in minerals in the blood (electrolytes).  Heart failure.  Heart valve problems.  Low blood oxygen levels or high carbon dioxide levels.  Heart attack, or coronary artery disease.  What are the signs or symptoms? The main symptom of this condition is a fast or skipped heartbeat (palpitations). Other symptoms include:  Chest pain.  Shortness of breath.  Feeling tired.  Dizziness.  In some cases, there are no symptoms. How is this diagnosed? This condition may be diagnosed based on:  Your medical history.  A physical exam. During the exam, the health care provider will check for irregular  heartbeats.  Tests, such as: ? An ECG (electrocardiogram) to monitor the electrical activity of your heart. ? Holter monitor testing. This involves wearing a device that clips to your clothing and monitors the electrical activity of your heart over longer periods of time. ? Stress tests to see how exercise affects your heart rhythm and blood supply. ? Echocardiogram. This test uses sound waves (ultrasound) to produce an image of your heart. ? Electrophysiology study. This test checks the electric pathways in your heart.  How is this treated? Treatment depends on any underlying conditions, the type of PVCs that you are having, and how much the symptoms are interfering with your daily life. Possible treatments include:  Avoiding things that can trigger the premature contractions, such as caffeine or alcohol.  Medicines. These may be given if symptoms are severe or if the extra heartbeats are frequent.  Treatment for any underlying condition that is found to be the cause of the contractions.  Catheter ablation. This procedure destroys the heart tissues that send abnormal signals.  In some cases, no treatment is required. Follow these instructions at home: Lifestyle Follow these instructions as told by your health care provider:  Do not use any products that contain nicotine or tobacco, such as cigarettes and e-cigarettes. If you need help quitting, ask your  health care provider.  If caffeine triggers episodes of PVC, do not eat, drink, or use anything with caffeine in it.  If caffeine does not seem to trigger episodes, consume caffeine in moderation.  If alcohol triggers episodes of PVC, do not drink alcohol.  If alcohol does not seem to trigger episodes, limit alcohol intake to no more than 1 drink a day for nonpregnant women and 2 drinks a day for men. One drink equals 12 oz of beer, 5 oz of wine, or 1 oz of hard liquor.  Exercise regularly. Ask your health care provider what type  of exercise is safe for you.  Find healthy ways to manage stress. Avoid stressful situations when possible.  Try to get at least 7-9 hours of sleep each night, or as much as recommended by your health care provider.  Do not use illegal drugs.  General instructions  Take over-the-counter and prescription medicines only as told by your health care provider.  Keep all follow-up visits as told by your health care provider. This is important. Get help right away if:  You feel palpitations that are frequent or continual.  You have chest pain.  You have shortness of breath.  You have sweating for no reason.  You have nausea and vomiting.  You become light-headed or you faint. This information is not intended to replace advice given to you by your health care provider. Make sure you discuss any questions you have with your health care provider. Document Released: 05/19/2004 Document Revised: 05/26/2016 Document Reviewed: 03/08/2016 Elsevier Interactive Patient Education  [employer].
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
## 2018-02-15 ENCOUNTER — Encounter: Payer: Self-pay | Admitting: Cardiology

## 2018-02-15 ENCOUNTER — Ambulatory Visit: Payer: Medicare Other | Admitting: Cardiology

## 2018-02-15 VITALS — BP 168/82 | HR 92 | Ht 68.5 in | Wt 211.0 lb

## 2018-02-15 DIAGNOSIS — R55 Syncope and collapse: Secondary | ICD-10-CM | POA: Diagnosis not present

## 2018-02-15 DIAGNOSIS — I10 Essential (primary) hypertension: Secondary | ICD-10-CM | POA: Diagnosis not present

## 2018-02-15 DIAGNOSIS — I4891 Unspecified atrial fibrillation: Secondary | ICD-10-CM | POA: Diagnosis not present
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
## 2018-02-15 NOTE — Patient Instructions (Addendum)
Your physician wants you to follow-up in:6 months  with Dr.Branch You will receive a reminder letter in the mail two months in advance. If you don't receive a letter, please call our office to schedule the follow-up appointment.     Your physician recommends that you continue on your current medications as directed. Please refer to the Current Medication list given to you today.     Please calls [place] if you decide to take eliquis and metoprolol       No lab work or tests ordered today      Thank you for choosing [facility] !
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
## 2018-02-21 ENCOUNTER — Encounter

## 2018-02-21 ENCOUNTER — Encounter: Payer: Self-pay | Admitting: Internal Medicine

## 2018-02-21 ENCOUNTER — Ambulatory Visit: Payer: Medicare Other | Admitting: Internal Medicine

## 2018-02-21 VITALS — BP 150/90 | Temp 97.8°F | Wt 210.0 lb

## 2018-02-21 DIAGNOSIS — I4891 Unspecified atrial fibrillation: Secondary | ICD-10-CM | POA: Diagnosis not present

## 2018-02-21 DIAGNOSIS — Z79899 Other long term (current) drug therapy: Secondary | ICD-10-CM | POA: Diagnosis not present

## 2018-02-21 DIAGNOSIS — I10 Essential (primary) hypertension: Secondary | ICD-10-CM

## 2018-02-21 NOTE — Patient Instructions (Signed)
Go back on the hctz      25 mg per day   For BP control .   Will plan on   Another opinion about the a fib and   Interventions   As  Discussed .  Since you are well and have a long lived family .   If bp not at goal      Plan ROV in 2-3 months or after   Evaluation  And plan

## 2018-02-25 ENCOUNTER — Ambulatory Visit: Payer: Medicare Other | Admitting: Internal Medicine

## 2018-03-18 ENCOUNTER — Other Ambulatory Visit: Payer: Self-pay | Admitting: Internal Medicine
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
## 2018-03-26 ENCOUNTER — Ambulatory Visit: Payer: Medicare Other | Admitting: Internal Medicine

## 2018-03-26 ENCOUNTER — Encounter: Payer: Self-pay | Admitting: Internal Medicine

## 2018-03-26 VITALS — BP 118/72 | HR 96 | Temp 98.1°F | Ht 68.5 in | Wt 206.0 lb

## 2018-03-26 DIAGNOSIS — E785 Hyperlipidemia, unspecified: Secondary | ICD-10-CM | POA: Diagnosis not present

## 2018-03-26 DIAGNOSIS — Z0000 Encounter for general adult medical examination without abnormal findings: Secondary | ICD-10-CM

## 2018-03-26 DIAGNOSIS — I10 Essential (primary) hypertension: Secondary | ICD-10-CM | POA: Diagnosis not present

## 2018-03-26 DIAGNOSIS — I4891 Unspecified atrial fibrillation: Secondary | ICD-10-CM

## 2018-03-26 DIAGNOSIS — H9201 Otalgia, right ear: Secondary | ICD-10-CM | POA: Diagnosis not present

## 2018-03-26 DIAGNOSIS — Z79899 Other long term (current) drug therapy: Secondary | ICD-10-CM

## 2018-03-26 DIAGNOSIS — G4733 Obstructive sleep apnea (adult) (pediatric): Secondary | ICD-10-CM

## 2018-03-26 MED ORDER — HYDROCHLOROTHIAZIDE 25 MG PO TABS: 25.0000 mg | ORAL_TABLET | Freq: Every day | ORAL | 3 refills | Status: DC

## 2018-03-26 MED ORDER — OLMESARTAN MEDOXOMIL 40 MG PO TABS: 40.0000 mg | ORAL_TABLET | Freq: Every day | ORAL | 3 refills | Status: DC

## 2018-03-26 NOTE — Patient Instructions (Addendum)
I agree   With reassessing  Sleep evaluation .    And cardiology  Further opinion     Check into the shingrix vaccine .  Not a live vaccine  .   Stay  On the asa  At this time in liew of the other blood thinner s.   Plan  Fu  After evaluation from cardiology  Sleep .   OR if not feeling  Well.     Health Maintenance, Male A healthy lifestyle and preventive care is important for your health and wellness. Ask your health care provider about what schedule of regular examinations is right for you. What should I know about weight and diet? Eat a Healthy Diet  Eat plenty of vegetables, fruits, whole grains, low-fat dairy products, and lean protein.  Do not eat a lot of foods high in solid fats, added sugars, or salt.  Maintain a Healthy Weight Regular exercise can help you achieve or maintain a healthy weight. You should:  Do at least 150 minutes of exercise each week. The exercise should increase your heart rate and make you sweat (moderate-intensity exercise).  Do strength-training exercises at least twice a week.  Watch Your Levels of Cholesterol and Blood Lipids  Have your blood tested for lipids and cholesterol every 5 years starting at 78 years of age. If you are at high risk for heart disease, you should start having your blood tested when you are 78 years old. You may need to have your cholesterol levels checked more often if: ? Your lipid or cholesterol levels are high. ? You are older than 78 years of age. ? You are at high risk for heart disease.  What should I know about cancer screening? Many types of cancers can be detected early and may often be prevented. Lung Cancer  You should be screened every year for lung cancer if: ? You are a current smoker who has smoked for at least 30 years. ? You are a former smoker who has quit within the past 15 years.  Talk to your health care provider about your screening options, when you should start screening, and how often you  should be screened.  Colorectal Cancer  Routine colorectal cancer screening usually begins at 78 years of age and should be repeated every 5-10 years until you are 78 years old. You may need to be screened more often if early forms of precancerous polyps or small growths are found. Your health care provider may recommend screening at an earlier age if you have risk factors for colon cancer.  Your health care provider may recommend using home test kits to check for hidden blood in the stool.  A small camera at the end of a tube can be used to examine your colon (sigmoidoscopy or colonoscopy). This checks for the earliest forms of colorectal cancer.  Prostate and Testicular Cancer  Depending on your age and overall health, your health care provider may do certain tests to screen for prostate and testicular cancer.  Talk to your health care provider about any symptoms or concerns you have about testicular or prostate cancer.  Skin Cancer  Check your skin from head to toe regularly.  Tell your health care provider about any new moles or changes in moles, especially if: ? There is a change in a mole's size, shape, or color. ? You have a mole that is larger than a pencil eraser.  Always use sunscreen. Apply sunscreen liberally and repeat throughout the day.  Protect yourself by wearing long sleeves, pants, a wide-brimmed hat, and sunglasses when outside.  What should I know about heart disease, diabetes, and high blood pressure?  If you are 25-72 years of age, have your blood pressure checked every 3-5 years. If you are 74 years of age or older, have your blood pressure checked every year. You should have your blood pressure measured twice-once when you are at a hospital or clinic, and once when you are not at a hospital or clinic. Record the average of the two measurements. To check your blood pressure when you are not at a hospital or clinic, you can use: ? An automated blood pressure  machine at a pharmacy. ? A home blood pressure monitor.  Talk to your health care provider about your target blood pressure.  If you are between 34-96 years old, ask your health care provider if you should take aspirin to prevent heart disease.  Have regular diabetes screenings by checking your fasting blood sugar level. ? If you are at a normal weight and have a low risk for diabetes, have this test once every three years after the age of 70. ? If you are overweight and have a high risk for diabetes, consider being tested at a younger age or more often.  A one-time screening for abdominal aortic aneurysm (AAA) by ultrasound is recommended for men aged 20-75 years who are current or former smokers. What should I know about preventing infection? Hepatitis B If you have a higher risk for hepatitis B, you should be screened for this virus. Talk with your health care provider to find out if you are at risk for hepatitis B infection. Hepatitis C Blood testing is recommended for:  Everyone born from [birth year] through 1966.  Anyone with known risk factors for hepatitis C.  Sexually Transmitted Diseases (STDs)  You should be screened each year for STDs including gonorrhea and chlamydia if: ? You are sexually active and are younger than 78 years of age. ? You are older than 78 years of age and your health care provider tells you that you are at risk for this type of infection. ? Your sexual activity has changed since you were last screened and you are at an increased risk for chlamydia or gonorrhea. Ask your health care provider if you are at risk.  Talk with your health care provider about whether you are at high risk of being infected with HIV. Your health care provider may recommend a prescription medicine to help prevent HIV infection.  What else can I do?  Schedule regular health, dental, and eye exams.  Stay current with your vaccines (immunizations).  Do not use any tobacco products,  such as cigarettes, chewing tobacco, and e-cigarettes. If you need help quitting, ask your health care provider.  Limit alcohol intake to no more than 2 drinks per day. One drink equals 12 ounces of beer, 5 ounces of wine, or 1 ounces of hard liquor.  Do not use street drugs.  Do not share needles.  Ask your health care provider for help if you need support or information about quitting drugs.  Tell your health care provider if you often feel depressed.  Tell your health care provider if you have ever been abused or do not feel safe at home. This information is not intended to replace advice given to you by your health care provider. Make sure you discuss any questions you have with your health care provider. Document Released: 03/30/2008 Document  Revised: 05/31/2016 Document Reviewed: 07/06/2015 Elsevier Interactive Patient Education  2018 [employer]      

## 2018-04-02 ENCOUNTER — Telehealth (HOSPITAL_COMMUNITY): Payer: Self-pay | Admitting: *Deleted

## 2018-04-02 NOTE — Telephone Encounter (Signed)
lft msg with pt spouse for him to [facility] to sched an appt..  Pt was referred to afib clinic by Dr. Regis Bill

## 2018-04-12 ENCOUNTER — Ambulatory Visit (HOSPITAL_COMMUNITY)
Admission: RE | Admit: 2018-04-12 | Discharge: 2018-04-12 | Disposition: A | Discharge status: Home / Self Care | Payer: Medicare Other | Source: Ambulatory Visit | Attending: Nurse Practitioner | Admitting: Nurse Practitioner

## 2018-04-12 ENCOUNTER — Encounter (HOSPITAL_COMMUNITY): Payer: Self-pay | Admitting: Nurse Practitioner

## 2018-04-12 VITALS — BP 132/76 | HR 89 | Ht 68.5 in | Wt 206.0 lb

## 2018-04-12 DIAGNOSIS — G4733 Obstructive sleep apnea (adult) (pediatric): Secondary | ICD-10-CM | POA: Insufficient documentation

## 2018-04-12 DIAGNOSIS — Z87891 Personal history of nicotine dependence: Secondary | ICD-10-CM | POA: Diagnosis not present

## 2018-04-12 DIAGNOSIS — Z79899 Other long term (current) drug therapy: Secondary | ICD-10-CM | POA: Insufficient documentation

## 2018-04-12 DIAGNOSIS — Z880 Allergy status to penicillin: Secondary | ICD-10-CM | POA: Diagnosis not present

## 2018-04-12 DIAGNOSIS — E781 Pure hyperglyceridemia: Secondary | ICD-10-CM | POA: Insufficient documentation

## 2018-04-12 DIAGNOSIS — I481 Persistent atrial fibrillation: Secondary | ICD-10-CM | POA: Diagnosis not present

## 2018-04-12 DIAGNOSIS — I10 Essential (primary) hypertension: Secondary | ICD-10-CM | POA: Insufficient documentation

## 2018-04-12 DIAGNOSIS — I4819 Other persistent atrial fibrillation: Secondary | ICD-10-CM

## 2018-04-12 MED ORDER — APIXABAN 5 MG PO TABS: 5.0000 mg | ORAL_TABLET | Freq: Two times a day (BID) | ORAL | 0 refills | Status: DC

## 2018-04-12 MED ORDER — APIXABAN 5 MG PO TABS: 5.0000 mg | ORAL_TABLET | Freq: Two times a day (BID) | ORAL | 3 refills | Status: DC
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
## 2018-04-12 NOTE — Patient Instructions (Signed)
Start Eliquis '5mg'$  twice a day  Stop aspirin

## 2018-04-22 ENCOUNTER — Encounter (HOSPITAL_COMMUNITY): Payer: Self-pay | Admitting: Nurse Practitioner

## 2018-04-22 ENCOUNTER — Ambulatory Visit (HOSPITAL_COMMUNITY)
Admission: RE | Admit: 2018-04-22 | Discharge: 2018-04-22 | Disposition: A | Discharge status: Home / Self Care | Payer: Medicare Other | Source: Ambulatory Visit | Attending: Nurse Practitioner | Admitting: Nurse Practitioner

## 2018-04-22 VITALS — BP 126/78 | HR 96 | Ht 68.5 in | Wt 206.0 lb

## 2018-04-22 DIAGNOSIS — G4733 Obstructive sleep apnea (adult) (pediatric): Secondary | ICD-10-CM | POA: Diagnosis not present

## 2018-04-22 DIAGNOSIS — Z880 Allergy status to penicillin: Secondary | ICD-10-CM | POA: Diagnosis not present

## 2018-04-22 DIAGNOSIS — R0683 Snoring: Secondary | ICD-10-CM | POA: Diagnosis not present

## 2018-04-22 DIAGNOSIS — I481 Persistent atrial fibrillation: Secondary | ICD-10-CM

## 2018-04-22 DIAGNOSIS — E781 Pure hyperglyceridemia: Secondary | ICD-10-CM | POA: Insufficient documentation

## 2018-04-22 DIAGNOSIS — Z87891 Personal history of nicotine dependence: Secondary | ICD-10-CM | POA: Insufficient documentation

## 2018-04-22 DIAGNOSIS — I10 Essential (primary) hypertension: Secondary | ICD-10-CM | POA: Insufficient documentation

## 2018-04-22 DIAGNOSIS — Z79899 Other long term (current) drug therapy: Secondary | ICD-10-CM | POA: Insufficient documentation

## 2018-04-22 DIAGNOSIS — Z7901 Long term (current) use of anticoagulants: Secondary | ICD-10-CM | POA: Diagnosis not present

## 2018-04-22 DIAGNOSIS — I4819 Other persistent atrial fibrillation: Secondary | ICD-10-CM

## 2018-04-22 LAB — BASIC METABOLIC PANEL
Anion gap: 12 (ref 5–15)
BUN: 21 mg/dL (ref 8–23)
CO2: 26 mmol/L (ref 22–32)
Calcium: 10.4 mg/dL — ABNORMAL HIGH (ref 8.9–10.3)
Chloride: 104 mmol/L (ref 98–111)
Creatinine, Ser: 1.32 mg/dL — ABNORMAL HIGH (ref 0.61–1.24)
GFR calc Af Amer: 58 mL/min — ABNORMAL LOW (ref >60)
GFR, EST NON AFRICAN AMERICAN: 50 mL/min — AB (ref >60)
Glucose, Bld: 130 mg/dL — ABNORMAL HIGH (ref 70–99)
POTASSIUM: 3.7 mmol/L (ref 3.5–5.1)
SODIUM: 142 mmol/L (ref 135–145)

## 2018-04-22 LAB — CBC
HEMATOCRIT: 46.1 % (ref 39.0–52.0)
Hemoglobin: 15.5 g/dL (ref 13.0–17.0)
MCH: 28.4 pg (ref 26.0–34.0)
MCHC: 33.6 g/dL (ref 30.0–36.0)
MCV: 84.6 fL (ref 78.0–100.0)
Platelets: 280 10*3/uL (ref 150–400)
RBC: 5.45 MIL/uL (ref 4.22–5.81)
RDW: 13 % (ref 11.5–15.5)
WBC: 8.3 10*3/uL (ref 4.0–10.5)

## 2018-04-22 MED ORDER — HYDROCHLOROTHIAZIDE 25 MG PO TABS: 12.5000 mg | ORAL_TABLET | Freq: Every day | ORAL | 3 refills | Status: DC

## 2018-04-22 NOTE — Patient Instructions (Signed)
Decrease HCTZ to 1/2 tablet daily
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
## 2018-04-26 ENCOUNTER — Encounter (HOSPITAL_COMMUNITY): Payer: Self-pay | Admitting: Nurse Practitioner

## 2018-04-26 ENCOUNTER — Ambulatory Visit (HOSPITAL_COMMUNITY)
Admission: RE | Admit: 2018-04-26 | Discharge: 2018-04-26 | Disposition: A | Discharge status: Home / Self Care | Payer: Medicare Other | Source: Ambulatory Visit | Attending: Nurse Practitioner | Admitting: Nurse Practitioner

## 2018-04-26 VITALS — BP 148/76 | HR 93 | Ht 68.5 in | Wt 209.0 lb

## 2018-04-26 DIAGNOSIS — R61 Generalized hyperhidrosis: Secondary | ICD-10-CM | POA: Diagnosis not present

## 2018-04-26 DIAGNOSIS — Z87891 Personal history of nicotine dependence: Secondary | ICD-10-CM | POA: Insufficient documentation

## 2018-04-26 DIAGNOSIS — Z880 Allergy status to penicillin: Secondary | ICD-10-CM | POA: Diagnosis not present

## 2018-04-26 DIAGNOSIS — G4733 Obstructive sleep apnea (adult) (pediatric): Secondary | ICD-10-CM | POA: Diagnosis not present

## 2018-04-26 DIAGNOSIS — I4891 Unspecified atrial fibrillation: Secondary | ICD-10-CM | POA: Diagnosis present

## 2018-04-26 DIAGNOSIS — Z79899 Other long term (current) drug therapy: Secondary | ICD-10-CM | POA: Insufficient documentation

## 2018-04-26 DIAGNOSIS — Z7901 Long term (current) use of anticoagulants: Secondary | ICD-10-CM | POA: Insufficient documentation

## 2018-04-26 DIAGNOSIS — I481 Persistent atrial fibrillation: Secondary | ICD-10-CM | POA: Insufficient documentation

## 2018-04-26 DIAGNOSIS — E781 Pure hyperglyceridemia: Secondary | ICD-10-CM | POA: Diagnosis not present

## 2018-04-26 DIAGNOSIS — I10 Essential (primary) hypertension: Secondary | ICD-10-CM | POA: Diagnosis not present

## 2018-04-26 DIAGNOSIS — I4819 Other persistent atrial fibrillation: Secondary | ICD-10-CM

## 2018-04-26 MED ORDER — METOPROLOL TARTRATE 25 MG PO TABS: 12.5000 mg | ORAL_TABLET | Freq: Two times a day (BID) | ORAL | 3 refills | Status: DC
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
## 2018-04-26 NOTE — Patient Instructions (Signed)
Start Metoprolol 1/2 tablet twice a day

## 2018-05-06 ENCOUNTER — Ambulatory Visit: Payer: Medicare Other | Admitting: Pulmonary Disease

## 2018-05-06 ENCOUNTER — Encounter: Payer: Self-pay | Admitting: Pulmonary Disease

## 2018-05-06 VITALS — BP 114/76 | HR 60 | Ht 68.5 in | Wt 207.4 lb

## 2018-05-06 DIAGNOSIS — G4733 Obstructive sleep apnea (adult) (pediatric): Secondary | ICD-10-CM | POA: Diagnosis not present

## 2018-05-06 DIAGNOSIS — I480 Paroxysmal atrial fibrillation: Secondary | ICD-10-CM

## 2018-05-06 NOTE — Assessment & Plan Note (Signed)
Correlation between OSA and atrial fibrillation was discussed.  The chances of staying in normal sinus rhythm with ASA being adequately treated Was emphasized
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
## 2018-05-06 NOTE — Patient Instructions (Signed)
Home sleep study 

## 2018-05-06 NOTE — Assessment & Plan Note (Signed)
Given excessive daytime somnolence, narrow pharyngeal exam, witnessed apneas & loud snoring, obstructive sleep apnea is very likely & an overnight polysomnogram will be scheduled as a home  study. The pathophysiology of obstructive sleep apnea , it's cardiovascular consequences & modes of treatment including CPAP were discused with the patient in detail & they evidenced understanding.  Pretest probability is low and he may have mild OSA.

## 2018-05-10 ENCOUNTER — Ambulatory Visit (HOSPITAL_COMMUNITY)
Admission: RE | Admit: 2018-05-10 | Discharge: 2018-05-10 | Disposition: A | Discharge status: Home / Self Care | Payer: Medicare Other | Source: Ambulatory Visit | Attending: Nurse Practitioner | Admitting: Nurse Practitioner

## 2018-05-10 ENCOUNTER — Encounter (HOSPITAL_COMMUNITY): Payer: Self-pay | Admitting: Nurse Practitioner

## 2018-05-10 VITALS — BP 150/76 | HR 72 | Ht 68.5 in | Wt 207.0 lb

## 2018-05-10 DIAGNOSIS — I10 Essential (primary) hypertension: Secondary | ICD-10-CM | POA: Insufficient documentation

## 2018-05-10 DIAGNOSIS — I4819 Other persistent atrial fibrillation: Secondary | ICD-10-CM

## 2018-05-10 DIAGNOSIS — Z7901 Long term (current) use of anticoagulants: Secondary | ICD-10-CM | POA: Insufficient documentation

## 2018-05-10 DIAGNOSIS — Z880 Allergy status to penicillin: Secondary | ICD-10-CM | POA: Insufficient documentation

## 2018-05-10 DIAGNOSIS — G4733 Obstructive sleep apnea (adult) (pediatric): Secondary | ICD-10-CM | POA: Insufficient documentation

## 2018-05-10 DIAGNOSIS — Z833 Family history of diabetes mellitus: Secondary | ICD-10-CM | POA: Diagnosis not present

## 2018-05-10 DIAGNOSIS — R739 Hyperglycemia, unspecified: Secondary | ICD-10-CM | POA: Diagnosis not present

## 2018-05-10 DIAGNOSIS — Z87891 Personal history of nicotine dependence: Secondary | ICD-10-CM | POA: Insufficient documentation

## 2018-05-10 DIAGNOSIS — Z823 Family history of stroke: Secondary | ICD-10-CM | POA: Insufficient documentation

## 2018-05-10 DIAGNOSIS — Z79899 Other long term (current) drug therapy: Secondary | ICD-10-CM | POA: Insufficient documentation

## 2018-05-10 DIAGNOSIS — I481 Persistent atrial fibrillation: Secondary | ICD-10-CM | POA: Insufficient documentation

## 2018-05-10 LAB — BASIC METABOLIC PANEL
ANION GAP: 7 (ref 5–15)
BUN: 12 mg/dL (ref 8–23)
CALCIUM: 10.1 mg/dL (ref 8.9–10.3)
CO2: 28 mmol/L (ref 22–32)
Chloride: 106 mmol/L (ref 98–111)
Creatinine, Ser: 1.23 mg/dL (ref 0.61–1.24)
GFR calc Af Amer: >60 mL/min (ref >60)
GFR, EST NON AFRICAN AMERICAN: 55 mL/min — AB (ref >60)
GLUCOSE: 102 mg/dL — AB (ref 70–99)
POTASSIUM: 4.2 mmol/L (ref 3.5–5.1)
SODIUM: 141 mmol/L (ref 135–145)

## 2018-05-10 LAB — CBC
HCT: 43.8 % (ref 39.0–52.0)
Hemoglobin: 14.8 g/dL (ref 13.0–17.0)
MCH: 28.6 pg (ref 26.0–34.0)
MCHC: 33.8 g/dL (ref 30.0–36.0)
MCV: 84.6 fL (ref 78.0–100.0)
PLATELETS: 223 10*3/uL (ref 150–400)
RBC: 5.18 MIL/uL (ref 4.22–5.81)
RDW: 13 % (ref 11.5–15.5)
WBC: 7 10*3/uL (ref 4.0–10.5)
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
## 2018-05-10 NOTE — Patient Instructions (Signed)
Cardioversion scheduled for Thursday, August 1st  - Arrive at the [facility] and go to admitting at 12:30PM  -Do not eat or drink anything after midnight the night prior to your procedure.  - Take all your morning medication with a sip of water prior to arrival.  - You will not be able to drive home after your procedure.
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
## 2018-05-16 ENCOUNTER — Ambulatory Visit (HOSPITAL_COMMUNITY)
Admission: RE | Admit: 2018-05-16 | Discharge: 2018-05-16 | Disposition: A | Discharge status: Home / Self Care | Payer: Medicare Other | Source: Ambulatory Visit | Attending: Cardiology | Admitting: Cardiology

## 2018-05-16 ENCOUNTER — Ambulatory Visit (HOSPITAL_COMMUNITY): Payer: Medicare Other | Admitting: Certified Registered Nurse Anesthetist

## 2018-05-16 ENCOUNTER — Encounter (HOSPITAL_COMMUNITY): Payer: Self-pay | Admitting: *Deleted

## 2018-05-16 ENCOUNTER — Other Ambulatory Visit: Payer: Self-pay

## 2018-05-16 ENCOUNTER — Encounter (HOSPITAL_COMMUNITY)
Admission: RE | Disposition: A | Discharge status: Home / Self Care | Payer: Self-pay | Source: Ambulatory Visit | Attending: Cardiology

## 2018-05-16 DIAGNOSIS — M1990 Unspecified osteoarthritis, unspecified site: Secondary | ICD-10-CM | POA: Insufficient documentation

## 2018-05-16 DIAGNOSIS — I481 Persistent atrial fibrillation: Secondary | ICD-10-CM | POA: Insufficient documentation

## 2018-05-16 DIAGNOSIS — Z7901 Long term (current) use of anticoagulants: Secondary | ICD-10-CM | POA: Diagnosis not present

## 2018-05-16 DIAGNOSIS — K219 Gastro-esophageal reflux disease without esophagitis: Secondary | ICD-10-CM | POA: Insufficient documentation

## 2018-05-16 DIAGNOSIS — G4733 Obstructive sleep apnea (adult) (pediatric): Secondary | ICD-10-CM | POA: Insufficient documentation

## 2018-05-16 DIAGNOSIS — Z87891 Personal history of nicotine dependence: Secondary | ICD-10-CM | POA: Diagnosis not present

## 2018-05-16 DIAGNOSIS — I480 Paroxysmal atrial fibrillation: Secondary | ICD-10-CM | POA: Diagnosis not present

## 2018-05-16 DIAGNOSIS — I10 Essential (primary) hypertension: Secondary | ICD-10-CM | POA: Insufficient documentation

## 2018-05-16 HISTORY — PX: CARDIOVERSION: SHX1299

## 2018-05-16 SURGERY — CARDIOVERSION
Anesthesia: General

## 2018-05-16 MED ORDER — PROPOFOL 10 MG/ML IV BOLUS
INTRAVENOUS | Status: DC | PRN
  Administered 2018-05-16: 80 mg via INTRAVENOUS

## 2018-05-16 MED ORDER — LIDOCAINE 2% (20 MG/ML) 5 ML SYRINGE
INTRAMUSCULAR | Status: DC | PRN
  Administered 2018-05-16: 100 mg via INTRAVENOUS

## 2018-05-16 MED ORDER — SODIUM CHLORIDE 0.9 % IV SOLN
INTRAVENOUS | Status: DC | PRN
  Administered 2018-05-16: 13:00:00 via INTRAVENOUS

## 2018-05-16 NOTE — Anesthesia Postprocedure Evaluation (Signed)
Anesthesia Post Note  Patient: Bryan Cabrera  Procedure(s) Performed: CARDIOVERSION (N/A )     Patient location during evaluation: PACU Anesthesia Type: General Level of consciousness: awake and alert Pain management: pain level controlled Vital Signs Assessment: post-procedure vital signs reviewed and stable Respiratory status: spontaneous breathing, nonlabored ventilation, respiratory function stable and patient connected to nasal cannula oxygen Cardiovascular status: blood pressure returned to baseline and stable Postop Assessment: no apparent nausea or vomiting Anesthetic complications: no    Last Vitals:  Vitals:   05/16/18 1309 05/16/18 1320  BP: 132/68 138/82  Pulse:  73  Resp: 19 (!) 31  Temp: 37.1 C   SpO2: 95% 94%    Last Pain:  Vitals:   05/16/18 1320  TempSrc:   PainSc: 0-No pain                 [name] DAVID

## 2018-05-16 NOTE — CV Procedure (Signed)
    Electrical Cardioversion Procedure Note Bryan Cabrera [id number] 03/06/1941  Procedure: Electrical Cardioversion Indications:  Atrial Fibrillation  Time Out: Verified patient identification, verified procedure,medications/allergies/relevent history reviewed, required imaging and test results available.  Performed  Procedure Details  The patient was NPO after midnight. Anesthesia was administered at the beside  by [facility] with 80mg  of propofol.  Cardioversion was performed with synchronized biphasic defibrillation via AP pads with 120, 200, 200 joules.  3 attempt(s) were performed.  The patient converted to normal sinus rhythm. The patient tolerated the procedure well   IMPRESSION:  Successful cardioversion of atrial fibrillation upon the third shock at 200J.   Bryan Cabrera 05/16/2018, 1:02 PM

## 2018-05-16 NOTE — Transfer of Care (Signed)
Immediate Anesthesia Transfer of Care Note  Patient: Bryan Cabrera  Procedure(s) Performed: CARDIOVERSION (N/A )  Patient Location: Endoscopy Unit  Anesthesia Type:General  Level of Consciousness: awake and alert   Airway & Oxygen Therapy: Patient Spontanous Breathing  Post-op Assessment: Report given to RN, Post -op Vital signs reviewed and stable and Patient moving all extremities X 4  Post vital signs: Reviewed and stable  Last Vitals:  Vitals Value Taken Time  BP    Temp    Pulse    Resp    SpO2      Last Pain:  Vitals:   05/16/18 1238  TempSrc: Oral  PainSc: 0-No pain         Complications: No apparent anesthesia complications

## 2018-05-16 NOTE — Interval H&P Note (Signed)
History and Physical Interval Note:  05/16/2018 12:55 PM  Bryan Cabrera  has presented today for surgery, with the diagnosis of ATRIAL FIBRILLATION  The various methods of treatment have been discussed with the patient and family. After consideration of risks, benefits and other options for treatment, the patient has consented to  Procedure(s): CARDIOVERSION (N/A) as a surgical intervention .  The patient's history has been reviewed, patient examined, no change in status, stable for surgery.  I have reviewed the patient's chart and labs.  Questions were answered to the patient's satisfaction.     [employer]

## 2018-05-16 NOTE — Anesthesia Procedure Notes (Signed)
Procedure Name: General with mask airway Date/Time: 05/16/2018 12:45 PM Performed by: Harden Mo, CRNA Pre-anesthesia Checklist: Patient identified, Emergency Drugs available, Suction available and Patient being monitored Patient Re-evaluated:Patient Re-evaluated prior to induction Oxygen Delivery Method: Ambu bag Preoxygenation: Pre-oxygenation with 100% oxygen Induction Type: IV induction Placement Confirmation: positive ETCO2 and breath sounds checked- equal and bilateral Dental Injury: Teeth and Oropharynx as per pre-operative assessment

## 2018-05-16 NOTE — Anesthesia Preprocedure Evaluation (Addendum)
Anesthesia Evaluation  Patient identified by MRN, date of birth, ID band Patient awake    Reviewed: Allergy & Precautions, NPO status , Patient's Chart, lab work & pertinent test results  Airway Mallampati: I  TM Distance: >3 FB Neck ROM: Full    Dental   Pulmonary sleep apnea , former smoker,    Pulmonary exam normal        Cardiovascular hypertension, Pt. on medications Normal cardiovascular exam     Neuro/Psych    GI/Hepatic GERD  Medicated and Controlled,  Endo/Other    Renal/GU      Musculoskeletal   Abdominal   Peds  Hematology   Anesthesia Other Findings   Reproductive/Obstetrics                             Anesthesia Physical Anesthesia Plan  ASA: III  Anesthesia Plan: General   Post-op Pain Management:    Induction: Intravenous  PONV Risk Score and Plan: 2 and Treatment may vary due to age or medical condition  Airway Management Planned: Mask  Additional Equipment:   Intra-op Plan:   Post-operative Plan:   Informed Consent: I have reviewed the patients History and Physical, chart, labs and discussed the procedure including the risks, benefits and alternatives for the proposed anesthesia with the patient or authorized representative who has indicated his/her understanding and acceptance.     Plan Discussed with: CRNA and Surgeon  Anesthesia Plan Comments:        Anesthesia Quick Evaluation

## 2018-05-16 NOTE — Discharge Instructions (Signed)
Electrical Cardioversion, Care After °This sheet gives you information about how to care for yourself after your procedure. Your health care provider may also give you more specific instructions. If you have problems or questions, contact your health care provider. °What can I expect after the procedure? °After the procedure, it is common to have: °· Some redness on the skin where the shocks were given. ° °Follow these instructions at home: °· Do not drive for 24 hours if you were given a medicine to help you relax (sedative). °· Take over-the-counter and prescription medicines only as told by your health care provider. °· Ask your health care provider how to check your pulse. Check it often. °· Rest for 48 hours after the procedure or as told by your health care provider. °· Avoid or limit your caffeine use as told by your health care provider. °Contact a health care provider if: °· You feel like your heart is beating too quickly or your pulse is not regular. °· You have a serious muscle cramp that does not go away. °Get help right away if: °· You have discomfort in your chest. °· You are dizzy or you feel faint. °· You have trouble breathing or you are short of breath. °· Your speech is slurred. °· You have trouble moving an arm or leg on one side of your body. °· Your fingers or toes turn cold or blue. °This information is not intended to replace advice given to you by your health care provider. Make sure you discuss any questions you have with your health care provider. °Document Released: 07/23/2013 Document Revised: 05/05/2016 Document Reviewed: 04/07/2016 °Elsevier Interactive Patient Education © 2018 [employer] ° °

## 2018-05-18 ENCOUNTER — Encounter (HOSPITAL_COMMUNITY): Payer: Self-pay | Admitting: Cardiology

## 2018-05-23 DIAGNOSIS — G4733 Obstructive sleep apnea (adult) (pediatric): Secondary | ICD-10-CM | POA: Diagnosis not present

## 2018-05-24 ENCOUNTER — Other Ambulatory Visit: Payer: Self-pay | Admitting: *Deleted

## 2018-05-24 ENCOUNTER — Ambulatory Visit (HOSPITAL_COMMUNITY)
Admission: RE | Admit: 2018-05-24 | Discharge: 2018-05-24 | Disposition: A | Discharge status: Home / Self Care | Payer: Medicare Other | Source: Ambulatory Visit | Attending: Nurse Practitioner | Admitting: Nurse Practitioner

## 2018-05-24 ENCOUNTER — Encounter (HOSPITAL_COMMUNITY): Payer: Self-pay | Admitting: Nurse Practitioner

## 2018-05-24 VITALS — BP 142/72 | HR 61 | Ht 68.5 in | Wt 207.0 lb

## 2018-05-24 DIAGNOSIS — Z79899 Other long term (current) drug therapy: Secondary | ICD-10-CM | POA: Insufficient documentation

## 2018-05-24 DIAGNOSIS — E781 Pure hyperglyceridemia: Secondary | ICD-10-CM | POA: Insufficient documentation

## 2018-05-24 DIAGNOSIS — G4733 Obstructive sleep apnea (adult) (pediatric): Secondary | ICD-10-CM | POA: Insufficient documentation

## 2018-05-24 DIAGNOSIS — Z87891 Personal history of nicotine dependence: Secondary | ICD-10-CM | POA: Insufficient documentation

## 2018-05-24 DIAGNOSIS — Z7901 Long term (current) use of anticoagulants: Secondary | ICD-10-CM | POA: Insufficient documentation

## 2018-05-24 DIAGNOSIS — I4819 Other persistent atrial fibrillation: Secondary | ICD-10-CM

## 2018-05-24 DIAGNOSIS — I481 Persistent atrial fibrillation: Secondary | ICD-10-CM | POA: Diagnosis present

## 2018-05-24 DIAGNOSIS — I10 Essential (primary) hypertension: Secondary | ICD-10-CM | POA: Diagnosis not present

## 2018-05-24 DIAGNOSIS — Z880 Allergy status to penicillin: Secondary | ICD-10-CM | POA: Diagnosis not present
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
## 2018-05-27 ENCOUNTER — Telehealth: Payer: Self-pay | Admitting: Pulmonary Disease

## 2018-05-27 DIAGNOSIS — G4733 Obstructive sleep apnea (adult) (pediatric): Secondary | ICD-10-CM

## 2018-05-27 NOTE — Telephone Encounter (Signed)
Per RA, HST showed severe OSA with 51 events per hour. Also noticed some central apneas. Because of it, he recommends a CPAP titration study as the next step.   Will order this as soon as the patient is aware.

## 2018-05-29 NOTE — Telephone Encounter (Signed)
Left message for patient to call back for results.  

## 2018-06-10 NOTE — Telephone Encounter (Signed)
Called the home number, was advised by his wife to call his work number.   Spoke with patient. He is aware of results and verbalized understanding. He wishes to proceed with the cpap titration study. Will go ahead and place the order for patient.   Nothing further needed at time of call.

## 2018-06-18 ENCOUNTER — Ambulatory Visit: Payer: Medicare Other | Attending: Pulmonary Disease | Admitting: Pulmonary Disease

## 2018-06-18 DIAGNOSIS — G4733 Obstructive sleep apnea (adult) (pediatric): Secondary | ICD-10-CM | POA: Insufficient documentation

## 2018-06-28 ENCOUNTER — Ambulatory Visit (HOSPITAL_COMMUNITY)
Admission: RE | Admit: 2018-06-28 | Discharge: 2018-06-28 | Disposition: A | Discharge status: Home / Self Care | Payer: Medicare Other | Source: Ambulatory Visit | Attending: Nurse Practitioner | Admitting: Nurse Practitioner

## 2018-06-28 ENCOUNTER — Encounter (HOSPITAL_COMMUNITY): Payer: Self-pay | Admitting: Nurse Practitioner

## 2018-06-28 VITALS — BP 158/62 | HR 84 | Ht 68.5 in | Wt 207.6 lb

## 2018-06-28 DIAGNOSIS — I10 Essential (primary) hypertension: Secondary | ICD-10-CM | POA: Diagnosis not present

## 2018-06-28 DIAGNOSIS — Z880 Allergy status to penicillin: Secondary | ICD-10-CM | POA: Diagnosis not present

## 2018-06-28 DIAGNOSIS — I481 Persistent atrial fibrillation: Secondary | ICD-10-CM | POA: Insufficient documentation

## 2018-06-28 DIAGNOSIS — Z87891 Personal history of nicotine dependence: Secondary | ICD-10-CM | POA: Diagnosis not present

## 2018-06-28 DIAGNOSIS — M109 Gout, unspecified: Secondary | ICD-10-CM | POA: Diagnosis not present

## 2018-06-28 DIAGNOSIS — E781 Pure hyperglyceridemia: Secondary | ICD-10-CM | POA: Insufficient documentation

## 2018-06-28 DIAGNOSIS — Z7901 Long term (current) use of anticoagulants: Secondary | ICD-10-CM | POA: Diagnosis not present

## 2018-06-28 DIAGNOSIS — Z79899 Other long term (current) drug therapy: Secondary | ICD-10-CM | POA: Diagnosis not present

## 2018-06-28 DIAGNOSIS — G4733 Obstructive sleep apnea (adult) (pediatric): Secondary | ICD-10-CM | POA: Insufficient documentation

## 2018-06-28 DIAGNOSIS — I4891 Unspecified atrial fibrillation: Secondary | ICD-10-CM | POA: Diagnosis present

## 2018-06-28 DIAGNOSIS — I4819 Other persistent atrial fibrillation: Secondary | ICD-10-CM
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
## 2018-07-01 ENCOUNTER — Telehealth: Payer: Self-pay | Admitting: Pulmonary Disease

## 2018-07-01 DIAGNOSIS — G4733 Obstructive sleep apnea (adult) (pediatric): Secondary | ICD-10-CM | POA: Diagnosis not present

## 2018-07-01 NOTE — Procedures (Signed)
Patient Name: Mayes, Sangiovanni Date: 06/18/2018   Gender: Male  D.O.B: Sep 28, 1941  Age (years): 55  Referring Provider: Kara Mead MD, ABSM  Height (inches): 69  Interpreting Physician: Kara Mead MD, ABSM  Weight (lbs): 207  RPSGT: Rosebud Poles  BMI: 31  MRN: [id number]  Neck Size: 18.00  <br> <br>  CLINICAL INFORMATION  The patient is referred for a CPAP titration to treat sleep apnea. Date of HST: 05/2018 AHI 51/h, few central apneas SLEEP STUDY TECHNIQUE  As per the AASM Manual for the Scoring of Sleep and Associated Events v2.3 (April 2016) with a hypopnea requiring 4% desaturations. The channels recorded and monitored were frontal, central and occipital EEG, electrooculogram (EOG), submentalis EMG (chin), nasal and oral airflow, thoracic and abdominal wall motion, anterior tibialis EMG, snore microphone, electrocardiogram, and pulse oximetry. Continuous positive airway pressure (CPAP) was initiated at the beginning of the study and titrated to treat sleep-disordered breathing. MEDICATIONS  Medications self-administered by patient taken the night of the study : N/A TECHNICIAN COMMENTS  Comments added by technician: Patient tolerated CPAP fairly well. CPAP therapy started at 4 cm of H20 and increased up to 14 cm of H20 . Suboptimal pressure was obtained due to the inability to stop central-like events and mixed events in supine position. A pressure of 10-12 cm of H20 seemded to be optimal only in lateral positions  RESPIRATORY PARAMETERS  Optimal PAP Pressure (cm):   AHI at Optimal Pressure (/hr): N/A  Overall Minimal O2 (%): 88.0 Supine % at Optimal Pressure (%): N/A  Minimal O2 at Optimal Pressure (%): 88.0      SLEEP ARCHITECTURE  The study was initiated at 10:48:30 PM and ended at 5:25:13 AM. Sleep onset time was 5.2 minutes and the sleep efficiency was 92.4%%. The total sleep time was 366.5 minutes. The patient spent 4.8%% of the night in stage N1 sleep, 56.9%%  in stage N2 sleep, 6.0%% in stage N3 and 32.3% in REM.Stage REM latency was 59.0 minutes Wake after sleep onset was 25.0. Alpha intrusion was absent. Supine sleep was 34.97%. CARDIAC DATA  The 2 lead EKG demonstrated sinus rhythm. The mean heart rate was N/A beats per minute. Other EKG findings include: PVCs.  LEG MOVEMENT DATA  The total Periodic Limb Movements of Sleep (PLMS) were 0. The PLMS index was 0.0. A PLMS index of <15 is considered normal in adults. IMPRESSIONS  - An optimal PAP pressure could not be selected for this patient based on the available study data. - Mild Central Sleep Apnea was noted during this titration (CAI = 11.5/h), worsened at higher pressures - Mild oxygen desaturations were observed during this titration (min O2 = 88.0%).  - The patient snored with soft snoring volume during this titration study.  - 2-lead EKG demonstrated: PVCs  - Clinically significant periodic limb movements were not noted during this study. Arousals associated with PLMs were rare. DIAGNOSIS  - Obstructive Sleep Apnea (327.23 [G47.33 ICD-10]) - Central sleep apnea RECOMMENDATIONS  - Recommend a trial of Auto-CPAP 6-14 cm H2O. If central apneas persist, may need alternative therapy - Avoid alcohol, sedatives and other CNS depressants that may worsen sleep apnea and disrupt normal sleep architecture.  - Sleep hygiene should be reviewed to assess factors that may improve sleep quality.  - Weight management and regular exercise should be initiated or continued.  - Return to [facility] for re-evaluation after 4 weeks of therapy   Kara Mead MD Board Certified in [place]
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
## 2018-07-01 NOTE — Telephone Encounter (Signed)
Recommend a trial of Auto-CPAP 6-14 cm H2O large AirFit F20 OV in 6 wks with me

## 2018-07-01 NOTE — Telephone Encounter (Signed)
Called and spoke with patient he is aware and verbalized understanding. Order has been placed. Nothing further needed.  

## 2018-07-02 ENCOUNTER — Ambulatory Visit: Payer: Medicare Other | Admitting: Internal Medicine

## 2018-07-02 ENCOUNTER — Encounter: Payer: Self-pay | Admitting: Internal Medicine

## 2018-07-02 VITALS — BP 148/76 | HR 80 | Temp 97.5°F | Wt 205.9 lb

## 2018-07-02 DIAGNOSIS — M109 Gout, unspecified: Secondary | ICD-10-CM | POA: Diagnosis not present

## 2018-07-02 DIAGNOSIS — I10 Essential (primary) hypertension: Secondary | ICD-10-CM

## 2018-07-02 DIAGNOSIS — I480 Paroxysmal atrial fibrillation: Secondary | ICD-10-CM | POA: Diagnosis not present

## 2018-07-02 DIAGNOSIS — Z79899 Other long term (current) drug therapy: Secondary | ICD-10-CM | POA: Diagnosis not present

## 2018-07-02 MED ORDER — PREDNISONE 10 MG PO TABS: ORAL_TABLET | ORAL | 0 refills | Status: DC

## 2018-07-02 MED ORDER — METOPROLOL TARTRATE 25 MG PO TABS: 25.0000 mg | ORAL_TABLET | Freq: Two times a day (BID) | ORAL | 3 refills | Status: DC

## 2018-07-02 NOTE — Patient Instructions (Addendum)
Take prednisone taper and can taper more quickly if doing well.    If bp over 140/90 then increase  metoprolol to  25 mg twice a day .    Send in readings after  2-3 weeks.   Notify team if pulse 50 and below .   Plan  Lab work   Uric acid .    in future lab.   Plan ROV in 3- 4 months depending on how doing  And bp control     Gout Gout is painful swelling that can occur in some of your joints. Gout is a type of arthritis. This condition is caused by having too much uric acid in your body. Uric acid is a chemical that forms when your body breaks down substances called purines. Purines are important for building body proteins. When your body has too much uric acid, sharp crystals can form and build up inside your joints. This causes pain and swelling. Gout attacks can happen quickly and be very painful (acute gout). Over time, the attacks can affect more joints and become more frequent (chronic gout). Gout can also cause uric acid to build up under your skin and inside your kidneys. What are the causes? This condition is caused by too much uric acid in your blood. This can occur because:  Your kidneys do not remove enough uric acid from your blood. This is the most common cause.  Your body makes too much uric acid. This can occur with some cancers and cancer treatments. It can also occur if your body is breaking down too many red blood cells (hemolytic anemia).  You eat too many foods that are high in purines. These foods include organ meats and some seafood. Alcohol, especially beer, is also high in purines.  A gout attack may be triggered by trauma or stress. What increases the risk? This condition is more likely to develop in people who:  Have a family history of gout.  Are male and middle-aged.  Are male and have gone through menopause.  Are obese.  Frequently drink alcohol, especially beer.  Are dehydrated.  Lose weight too quickly.  Have an organ  transplant.  Have lead poisoning.  Take certain medicines, including aspirin, cyclosporine, diuretics, levodopa, and niacin.  Have kidney disease or psoriasis.  What are the signs or symptoms? An attack of acute gout happens quickly. It usually occurs in just one joint. The most common place is the big toe. Attacks often start at night. Other joints that may be affected include joints of the feet, ankle, knee, fingers, wrist, or elbow. Symptoms may include:  Severe pain.  Warmth.  Swelling.  Stiffness.  Tenderness. The affected joint may be very painful to touch.  Shiny, red, or purple skin.  Chills and fever.  Chronic gout may cause symptoms more frequently. More joints may be involved. You may also have white or yellow lumps (tophi) on your hands or feet or in other areas near your joints. How is this diagnosed? This condition is diagnosed based on your symptoms, medical history, and physical exam. You may have tests, such as:  Blood tests to measure uric acid levels.  Removal of joint fluid with a needle (aspiration) to look for uric acid crystals.  X-rays to look for joint damage.  How is this treated? Treatment for this condition has two phases: treating an acute attack and preventing future attacks. Acute gout treatment may include medicines to reduce pain and swelling, including:  NSAIDs.  Steroids.  These are strong anti-inflammatory medicines that can be taken by mouth (orally) or injected into a joint.  Colchicine. This medicine relieves pain and swelling when it is taken soon after an attack. It can be given orally or through an IV tube.  Preventive treatment may include:  Daily use of smaller doses of NSAIDs or colchicine.  Use of a medicine that reduces uric acid levels in your blood.  Changes to your diet. You may need to see a specialist about healthy eating (dietitian).  Follow these instructions at home: During a Gout Attack  If directed, apply  ice to the affected area: ? Put ice in a plastic bag. ? Place a towel between your skin and the bag. ? Leave the ice on for 20 minutes, 2-3 times a day.  Rest the joint as much as possible. If the affected joint is in your leg, you may be given crutches to use.  Raise (elevate) the affected joint above the level of your heart as often as possible.  Drink enough fluids to keep your urine clear or pale yellow.  Take over-the-counter and prescription medicines only as told by your health care provider.  Do not drive or operate heavy machinery while taking prescription pain medicine.  Follow instructions from your health care provider about eating or drinking restrictions.  Return to your normal activities as told by your health care provider. Ask your health care provider what activities are safe for you. Avoiding Future Gout Attacks  Follow a low-purine diet as told by your dietitian or health care provider. Avoid foods and drinks that are high in purines, including liver, kidney, anchovies, asparagus, herring, mushrooms, mussels, and beer.  Limit alcohol intake to no more than 1 drink a day for nonpregnant women and 2 drinks a day for men. One drink equals 12 oz of beer, 5 oz of wine, or 1 oz of hard liquor.  Maintain a healthy weight or lose weight if you are overweight. If you want to lose weight, talk with your health care provider. It is important that you do not lose weight too quickly.  Start or maintain an exercise program as told by your health care provider.  Drink enough fluids to keep your urine clear or pale yellow.  Take over-the-counter and prescription medicines only as told by your health care provider.  Keep all follow-up visits as told by your health care provider. This is important. Contact a health care provider if:  You have another gout attack.  You continue to have symptoms of a gout attack after10 days of treatment.  You have side effects from your  medicines.  You have chills or a fever.  You have burning pain when you urinate.  You have pain in your lower back or belly. Get help right away if:  You have severe or uncontrolled pain.  You cannot urinate. This information is not intended to replace advice given to you by your health care provider. Make sure you discuss any questions you have with your health care provider. Document Released: 09/29/2000 Document Revised: 03/09/2016 Document Reviewed: 07/15/2015 Elsevier Interactive Patient Education  [employer].

## 2018-07-03 ENCOUNTER — Telehealth: Payer: Self-pay | Admitting: Pulmonary Disease

## 2018-07-03 NOTE — Telephone Encounter (Signed)
I have sent this order to [facility]

## 2018-07-04 NOTE — Telephone Encounter (Signed)
Called spoke with patient he is aware that order was sent to [facility] and has already received a call from [facility] stating that order was received  Nothing further needed; will sign off

## 2018-07-15 ENCOUNTER — Telehealth: Payer: Self-pay | Admitting: Internal Medicine

## 2018-07-15 MED ORDER — METOPROLOL TARTRATE 25 MG PO TABS: 25.0000 mg | ORAL_TABLET | Freq: Two times a day (BID) | ORAL | 3 refills | Status: DC

## 2018-07-15 NOTE — Telephone Encounter (Signed)
Returned call to pharmacy. They did not receive script that was sent at 07/02/18 office visit. Medication re-sent to pharmacy as requested.

## 2018-07-15 NOTE — Telephone Encounter (Signed)
Copied from [place] [phone]. Topic: Quick Communication - Rx Refill/Question >> Jul 15, 2018 11:58 AM Cecelia Byars, NT wrote: Medication: metoprolol tartrate (LOPRESSOR) 25 MG tablet  Has the patient contacted their pharmacy? yes (Agent: If no, request that the patient contact the pharmacy for the refill. (Agent: If yes, when and what did the pharmacy advise?  The pharmacy is requesting a new prescription sent over due to the dose change in medication per the patient   Preferred Pharmacy (with phone number or street name [place], [place] [phone] (Phone) [phone] (Fax)    Agent: Please be advised that RX refills may take up to 3 business days. We ask that you follow-up with your pharmacy.

## 2018-08-09 ENCOUNTER — Encounter: Payer: Self-pay | Admitting: Family Medicine

## 2018-08-09 ENCOUNTER — Ambulatory Visit: Payer: Medicare Other | Admitting: Family Medicine

## 2018-08-09 ENCOUNTER — Ambulatory Visit: Payer: Self-pay

## 2018-08-09 VITALS — BP 162/86 | HR 70 | Temp 98.1°F | Wt 210.6 lb

## 2018-08-09 DIAGNOSIS — R7301 Impaired fasting glucose: Secondary | ICD-10-CM | POA: Diagnosis not present

## 2018-08-09 DIAGNOSIS — J069 Acute upper respiratory infection, unspecified: Secondary | ICD-10-CM

## 2018-08-09 DIAGNOSIS — I10 Essential (primary) hypertension: Secondary | ICD-10-CM | POA: Diagnosis not present

## 2018-08-09 MED ORDER — LATANOPROST 0.005 % OP SOLN: 1.0000 [drp] | Freq: Every day | OPHTHALMIC | 2 refills | Status: AC

## 2018-08-09 NOTE — Telephone Encounter (Signed)
Pt called with BP readings: 181/97 and 184/104. These BP were taken at 0800 and 0930 today. Pt has h/o hypertension and is on several medications for his high blood pressure.. Pt denies missing any doses of the medication. Pt stated he has been sick with a "bug" and took Tylenol this morning for a headache which was effective and alleviated the headache. Pt stated he had blurred vision last night. Pt denies chest pain, difficulty breathing or weakness. Care advice given and appointment given for today at 1:00 pm with Dr Ethlyn Gallery. PCP has no available appointments.  Reason for Disposition . Systolic BP  >= 497 OR Diastolic >= 530  Answer Assessment - Initial Assessment Questions 1. BLOOD PRESSURE: "What is the blood pressure?" "Did you take at least two measurements 5 minutes apart?"     181/97 and 184/104 2. ONSET: "When did you take your blood pressure?"     0800 and 0930 this am  3. HOW: "How did you obtain the blood pressure?" (e.g., visiting nurse, automatic home BP monitor)     Automatic home BP monitor 4. HISTORY: "Do you have a history of high blood pressure?"     yes 5. MEDICATIONS: "Are you taking any medications for blood pressure?" "Have you missed any doses recently?"     benicar 40 mg at bedtime and 12.5 NCTZ in the am and 25 mg Toprol and 5 mg Eliquis- no missed doses 6. OTHER SYMPTOMS: "Do you have any symptoms?" (e.g., headache, chest pain, blurred vision, difficulty breathing, weakness)     Headache took Tylenol and subsided, blurred vision last night,  7. PREGNANCY: "Is there any chance you are pregnant?" "When was your last menstrual period?"     n/a  Protocols used: HIGH BLOOD PRESSURE-A-AH

## 2018-08-09 NOTE — Progress Notes (Addendum)
Bryan Cabrera DOB: September 01, 1941 Encounter date: 08/09/2018  This is a 77 y.o. male who presents with Chief Complaint  Patient presents with  . Hypertension    pt has home readings with him,     History of present illness: Called this morning with elevated blood pressures: 181/97; 184/104. Had been sick with a "bug" and took tylenol in morning which alleviated headache. Reported blurred vision last night. Denied CP, pressure, diff breathing. Had bad head cold. Has been bothering him for a few days now. Seemed to start within his chest. Has just started CPAP a couple of weeks ago. Seemed to come in upper chest, then to throat. Yesterday nasal drainage was constant. By bedtime last night drainage had really stopped. Slept pretty well last night (didn't use CPAP due to congestion). This morning was shocked with elevated bp reading.   Has increased the metoprolol to 1 tab PO BID since last month (9/17) and taking 1/2 tab of hctz (12.5mg ) and taking 40mg  benicar. BP at home in last couple of weeks has ranged from 140-180/70-90). He is frustrated with blood pressures since they seem to be worsening since summer. Has had a rough few months with HTN, a fib, gout.    Had cardioversion for A fib in August and it was successful. No chest pain, pressure, no heart racing. Did take 2 ES tylenol this morning.   Has had some short periods of headache across top of head and down right front.   Last evening watching TV and felt that writing across bottom was blurry but he states that can be normal for him.   Had already taken HCTZ and metoprolol and eliquis an hour prior to checking bp this morning.    Allergies  Allergen Reactions  . Penicillins Other (See Comments)    REACTION: has family hx ? If ever had a reaction Has patient had a PCN reaction causing immediate rash, facial/tongue/throat swelling, SOB or lightheadedness with hypotension: No Has patient had a PCN reaction causing severe rash  involving mucus membranes or skin necrosis: No Has patient had a PCN reaction that required hospitalization: No Has patient had a PCN reaction occurring within the last 10 years: No If all of the above answers are "NO", then may proceed with Cephalosporin use.    . Quinapril Hcl Cough  . Tetracycline Hcl Other (See Comments)    Vomiting with illness  . Amoxicillin Other (See Comments)    REACTION: has family hx ? If ever had a reaction Has patient had a PCN reaction causing immediate rash, facial/tongue/throat swelling, SOB or lightheadedness with hypotension: No Has patient had a PCN reaction causing severe rash involving mucus membranes or skin necrosis: No Has patient had a PCN reaction that required hospitalization: No Has patient had a PCN reaction occurring within the last 10 years: No If all of the above answers are "NO", then may proceed with Cephalosporin use.    Current Meds  Medication Sig  . apixaban (ELIQUIS) 5 MG TABS tablet Take 1 tablet (5 mg total) by mouth 2 (two) times daily.  . hydrochlorothiazide (HYDRODIURIL) 25 MG tablet Take 0.5 tablets (12.5 mg total) by mouth daily.  Marland Kitchen latanoprost (XALATAN) 0.005 % ophthalmic solution Place 1 drop into both eyes at bedtime.  . metoprolol tartrate (LOPRESSOR) 25 MG tablet Take 1 tablet (25 mg total) by mouth 2 (two) times daily. If bp not controlled on lower dose  . olmesartan (BENICAR) 40 MG tablet Take 1 tablet (40 mg  total) by mouth daily.  . [DISCONTINUED] latanoprost (XALATAN) 0.005 % ophthalmic solution Place 1 drop into the left eye at bedtime.  . [DISCONTINUED] predniSONE (DELTASONE) 10 MG tablet Take pills per day,6,6,6,4,4,4,2,2,2,1,1,1    Review of Systems  Constitutional: Negative for chills, fatigue and fever.  HENT: Positive for congestion and postnasal drip. Negative for ear pain, sinus pressure, sinus pain and sore throat.   Eyes: Visual disturbance: has blurry vision just noted while watching TV and reading  running caption across screen; happens intermittently.  Respiratory: Negative for cough, chest tightness, shortness of breath and wheezing.   Cardiovascular: Negative for chest pain, palpitations and leg swelling.  Neurological: Negative for speech difficulty and headaches (no current headache).    Objective:  BP (!) 162/86   Pulse 70   Temp 98.1 F (36.7 C) (Oral)   Wt 210 lb 9.6 oz (95.5 kg)   SpO2 96%   BMI 31.56 kg/m   Weight: 210 lb 9.6 oz (95.5 kg)   BP Readings from Last 3 Encounters:  08/09/18 (!) 162/86  07/02/18 (!) 148/76  06/28/18 (!) 158/62   Wt Readings from Last 3 Encounters:  08/09/18 210 lb 9.6 oz (95.5 kg)  07/02/18 205 lb 14.4 oz (93.4 kg)  06/28/18 207 lb 9.6 oz (94.2 kg)    Physical Exam  Constitutional: He is oriented to person, place, and time. He appears well-developed and well-nourished. No distress.  HENT:  Head: Normocephalic and atraumatic.  Right Ear: External ear normal.  Left Ear: External ear normal.  Nose: Nose normal. Right sinus exhibits no maxillary sinus tenderness and no frontal sinus tenderness. Left sinus exhibits no maxillary sinus tenderness and no frontal sinus tenderness.  Mouth/Throat: Uvula is midline and mucous membranes are normal. Posterior oropharyngeal erythema (slight) present.  Neck: Neck supple.  Cardiovascular: Normal rate, regular rhythm and normal heart sounds. Exam reveals no friction rub.  No murmur heard. No lower extremity edema  Pulmonary/Chest: Effort normal and breath sounds normal. No respiratory distress. He has no wheezes. He has no rales.  Lymphadenopathy:    He has no cervical adenopathy.  Neurological: He is alert and oriented to person, place, and time.  Psychiatric: His behavior is normal. Cognition and memory are normal.    Assessment/Plan  1. Elevated fasting glucose Will add on A1C to future bloodwork.   2. Hypertension, unspecified type suboptimal control. Prefers to try and get off of  HCTZ in the future and so would like to work with adjusting other current medications. We will increase metoprolol at 1.5 tab BID and he is going to monitor heart rate at home. We did discuss that he may need treatment with different category of medication to achieve best control due to lower HR and limitations with further increases in beta blocker dose.  3. Viral upper respiratory tract infection Monitor and treat symptoms.   Return as planned. Bella Kennedy has been scheduled for approx 2-3 weeks time.       Micheline Rough, MD

## 2018-08-09 NOTE — Patient Instructions (Addendum)
Increase the metoprolol to 1.5 tabs twice daily. Continue to monitor your heart rate (let us know if running less than 55) and blood pressure.  DASH Eating Plan DASH stands for "Dietary Approaches to Stop Hypertension." The DASH eating plan is a healthy eating plan that has been shown to reduce high blood pressure (hypertension). It may also reduce your risk for type 2 diabetes, heart disease, and stroke. The DASH eating plan may also help with weight loss. What are tips for following this plan? General guidelines  Avoid eating more than 2,300 mg (milligrams) of salt (sodium) a day. If you have hypertension, you may need to reduce your sodium intake to 1,500 mg a day.  Limit alcohol intake to no more than 1 drink a day for nonpregnant women and 2 drinks a day for men. One drink equals 12 oz of beer, 5 oz of wine, or 1 oz of hard liquor.  Work with your health care provider to maintain a healthy body weight or to lose weight. Ask what an ideal weight is for you.  Get at least 30 minutes of exercise that causes your heart to beat faster (aerobic exercise) most days of the week. Activities may include walking, swimming, or biking.  Work with your health care provider or diet and nutrition specialist (dietitian) to adjust your eating plan to your individual calorie needs. Reading food labels  Check food labels for the amount of sodium per serving. Choose foods with less than 5 percent of the Daily Value of sodium. Generally, foods with less than 300 mg of sodium per serving fit into this eating plan.  To find whole grains, look for the word "whole" as the first word in the ingredient list. Shopping  Buy products labeled as "low-sodium" or "no salt added."  Buy fresh foods. Avoid canned foods and premade or frozen meals. Cooking  Avoid adding salt when cooking. Use salt-free seasonings or herbs instead of table salt or sea salt. Check with your health care provider or pharmacist before using  salt substitutes.  Do not fry foods. Cook foods using healthy methods such as baking, boiling, grilling, and broiling instead.  Cook with heart-healthy oils, such as olive, canola, soybean, or sunflower oil. Meal planning   Eat a balanced diet that includes: ? 5 or more servings of fruits and vegetables each day. At each meal, try to fill half of your plate with fruits and vegetables. ? Up to 6-8 servings of whole grains each day. ? Less than 6 oz of lean meat, poultry, or fish each day. A 3-oz serving of meat is about the same size as a deck of cards. One egg equals 1 oz. ? 2 servings of low-fat dairy each day. ? A serving of nuts, seeds, or beans 5 times each week. ? Heart-healthy fats. Healthy fats called Omega-3 fatty acids are found in foods such as flaxseeds and coldwater fish, like sardines, salmon, and mackerel.  Limit how much you eat of the following: ? Canned or prepackaged foods. ? Food that is high in trans fat, such as fried foods. ? Food that is high in saturated fat, such as fatty meat. ? Sweets, desserts, sugary drinks, and other foods with added sugar. ? Full-fat dairy products.  Do not salt foods before eating.  Try to eat at least 2 vegetarian meals each week.  Eat more home-cooked food and less restaurant, buffet, and fast food.  When eating at a restaurant, ask that your food be prepared with  less salt or no salt, if possible. What foods are recommended? The items listed may not be a complete list. Talk with your dietitian about what dietary choices are best for you. Grains Whole-grain or whole-wheat bread. Whole-grain or whole-wheat pasta. Brown rice. [place]. Bulgur. Whole-grain and low-sodium cereals. Pita bread. Low-fat, low-sodium crackers. Whole-wheat flour tortillas. Vegetables Fresh or frozen vegetables (raw, steamed, roasted, or grilled). Low-sodium or reduced-sodium tomato and vegetable juice. Low-sodium or reduced-sodium tomato sauce and  tomato paste. Low-sodium or reduced-sodium canned vegetables. Fruits All fresh, dried, or frozen fruit. Canned fruit in natural juice (without added sugar). Meat and other protein foods Skinless chicken or [place]. Ground chicken or [place]. Pork with fat trimmed off. Fish and seafood. Egg whites. Dried beans, peas, or lentils. Unsalted nuts, nut butters, and seeds. Unsalted canned beans. Lean cuts of beef with fat trimmed off. Low-sodium, lean deli meat. Dairy Low-fat (1%) or fat-free (skim) milk. Fat-free, low-fat, or reduced-fat cheeses. Nonfat, low-sodium ricotta or cottage cheese. Low-fat or nonfat yogurt. Low-fat, low-sodium cheese. Fats and oils Soft margarine without trans fats. Vegetable oil. Low-fat, reduced-fat, or light mayonnaise and salad dressings (reduced-sodium). Canola, safflower, olive, soybean, and sunflower oils. Avocado. Seasoning and other foods Herbs. Spices. Seasoning mixes without salt. Unsalted popcorn and pretzels. Fat-free sweets. What foods are not recommended? The items listed may not be a complete list. Talk with your dietitian about what dietary choices are best for you. Grains Baked goods made with fat, such as croissants, muffins, or some breads. Dry pasta or rice meal packs. Vegetables Creamed or fried vegetables. Vegetables in a cheese sauce. Regular canned vegetables (not low-sodium or reduced-sodium). Regular canned tomato sauce and paste (not low-sodium or reduced-sodium). Regular tomato and vegetable juice (not low-sodium or reduced-sodium). Angie Fava. Olives. Fruits Canned fruit in a light or heavy syrup. Fried fruit. Fruit in cream or butter sauce. Meat and other protein foods Fatty cuts of meat. Ribs. Fried meat. Berniece Salines. Sausage. Bologna and other processed lunch meats. Salami. Fatback. Hotdogs. Bratwurst. Salted nuts and seeds. Canned beans with added salt. Canned or smoked fish. Whole eggs or egg yolks. Chicken or [place] with skin. Dairy Whole or 2%  milk, cream, and half-and-half. Whole or full-fat cream cheese. Whole-fat or sweetened yogurt. Full-fat cheese. Nondairy creamers. Whipped toppings. Processed cheese and cheese spreads. Fats and oils Butter. Stick margarine. Lard. Shortening. Ghee. Bacon fat. Tropical oils, such as coconut, palm kernel, or palm oil. Seasoning and other foods Salted popcorn and pretzels. Onion salt, garlic salt, seasoned salt, table salt, and sea salt. Worcestershire sauce. Tartar sauce. Barbecue sauce. Teriyaki sauce. Soy sauce, including reduced-sodium. Steak sauce. Canned and packaged gravies. Fish sauce. Oyster sauce. Cocktail sauce. Horseradish that you find on the shelf. Ketchup. Mustard. Meat flavorings and tenderizers. Bouillon cubes. Hot sauce and Tabasco sauce. Premade or packaged marinades. Premade or packaged taco seasonings. Relishes. Regular salad dressings. Where to find more information:  National Heart, Lung, and [place]: https://wilson-eaton.com/  American Heart Association: www.heart.org Summary  The DASH eating plan is a healthy eating plan that has been shown to reduce high blood pressure (hypertension). It may also reduce your risk for type 2 diabetes, heart disease, and stroke.  With the DASH eating plan, you should limit salt (sodium) intake to 2,300 mg a day. If you have hypertension, you may need to reduce your sodium intake to 1,500 mg a day.  When on the DASH eating plan, aim to eat more fresh fruits and vegetables, whole grains, lean proteins,  low-fat dairy, and heart-healthy fats.  Work with your health care provider or diet and nutrition specialist (dietitian) to adjust your eating plan to your individual calorie needs. This information is not intended to replace advice given to you by your health care provider. Make sure you discuss any questions you have with your health care provider. Document Released: 09/21/2011 Document Revised: 09/25/2016 Document Reviewed:  09/25/2016 Elsevier Interactive Patient Education  2018 [employer].  Why is Exercise Important? If I told you I had a single pill that would help you decrease stress by improving anxiety, decreasing depression, help you achieve a healthy weight, give you more energy, make you more productive, help you focus, decrease your risk of dementia/heart attack/stroke/falls, improve your bone health, and more would you be interested? These are just some of the benefits that exercise brings to you. IT IS WORTH carving out some time every day to fit in exercise. It will help in every aspect of your health. Even if you have injuries that prevent you from participating in a type of exercise you used to do; there is always something that you can do to keep exercise a part of your life. If improving your health is important, make exercise your priority. It is worth the time! If you have questions about the type of exercise that is right for you, please talk with me about this!     Exercising to Stay Healthy  Exercising regularly is important. It has many health benefits, such as:  Improving your overall fitness, flexibility, and endurance.  Increasing your bone density.  Helping with weight control.  Decreasing your body fat.  Increasing your muscle strength.  Reducing stress and tension.  Improving your overall health.   In order to become healthy and stay healthy, it is recommended that you do moderate-intensity and vigorous-intensity exercise. You can tell that you are exercising at a moderate intensity if you have a higher heart rate and faster breathing, but you are still able to hold a conversation. You can tell that you are exercising at a vigorous intensity if you are breathing much harder and faster and cannot hold a conversation while exercising. How often should I exercise? Choose an activity that you enjoy and set realistic goals. Your health care provider can help you to make an activity  plan that works for you. Exercise regularly as directed by your health care provider. This may include:  Doing resistance training twice each week, such as: ? Push-ups. ? Sit-ups. ? Lifting weights. ? Using resistance bands.  Doing a given intensity of exercise for a given amount of time. Choose from these options: ? 150 minutes of moderate-intensity exercise every week. ? 75 minutes of vigorous-intensity exercise every week. ? A mix of moderate-intensity and vigorous-intensity exercise every week.   Children, pregnant women, people who are out of shape, people who are overweight, and older adults may need to consult a health care provider for individual recommendations. If you have any sort of medical condition, be sure to consult your health care provider before starting a new exercise program. What are some exercise ideas? Some moderate-intensity exercise ideas include:  Walking at a rate of 1 mile in 15 minutes.  Biking.  Hiking.  Golfing.  Dancing.   Some vigorous-intensity exercise ideas include:  Walking at a rate of at least 4.5 miles per hour.  Jogging or running at a rate of 5 miles per hour.  Biking at a rate of at least 10 miles per  hour.  Lap swimming.  Roller-skating or in-line skating.  Cross-country skiing.  Vigorous competitive sports, such as football, basketball, and soccer.  Jumping rope.  Aerobic dancing.   What are some everyday activities that can help me to get exercise?  [place] work, such as: ? Pushing a Conservation officer, nature. ? Raking and bagging leaves.  Washing and waxing your car.  Pushing a stroller.  Shoveling snow.  Gardening.  Washing windows or floors. How can I be more active in my day-to-day activities?  Use the stairs instead of the elevator.  Take a walk during your lunch break.  If you drive, park your car farther away from work or school.  If you take public transportation, get off one stop early and walk the rest of the  way.  Make all of your phone calls while standing up and walking around.  Get up, stretch, and walk around every 30 minutes throughout the day. What guidelines should I follow while exercising?  Do not exercise so much that you hurt yourself, feel dizzy, or get very short of breath.  Consult your health care provider before starting a new exercise program.  Wear comfortable clothes and shoes with good support.  Drink plenty of water while you exercise to prevent dehydration or heat stroke. Body water is lost during exercise and must be replaced.  Work out until you breathe faster and your heart beats faster. This information is not intended to replace advice given to you by your health care provider. Make sure you discuss any questions you have with your health care provider.

## 2018-08-10 ENCOUNTER — Encounter: Payer: Self-pay | Admitting: Family Medicine

## 2018-08-12 ENCOUNTER — Telehealth: Payer: Self-pay

## 2018-08-12 NOTE — Telephone Encounter (Signed)
Noted. Patient is aware to continue monitoring BP

## 2018-08-12 NOTE — Telephone Encounter (Signed)
Caren Macadam, MD  Rebecca Eaton, CMA        Please see how blood pressures looked through weekend? Make sure cold symptoms are feeling better.

## 2018-08-12 NOTE — Telephone Encounter (Signed)
Drainage has stopped. Pt states he is not coughing near as much and feels much better. BP yesterday, 12am 148/93, medication taken at 7pm, 4:00pm 134/77

## 2018-08-12 NOTE — Telephone Encounter (Signed)
Thanks. I like the 134/77 bp! 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 Glad he is feeling better!

## 2018-08-19 ENCOUNTER — Other Ambulatory Visit (INDEPENDENT_AMBULATORY_CARE_PROVIDER_SITE_OTHER): Payer: Medicare Other

## 2018-08-19 DIAGNOSIS — I10 Essential (primary) hypertension: Secondary | ICD-10-CM

## 2018-08-19 DIAGNOSIS — M109 Gout, unspecified: Secondary | ICD-10-CM

## 2018-08-19 DIAGNOSIS — Z79899 Other long term (current) drug therapy: Secondary | ICD-10-CM

## 2018-08-19 DIAGNOSIS — R7301 Impaired fasting glucose: Secondary | ICD-10-CM

## 2018-08-19 LAB — CBC WITH DIFFERENTIAL/PLATELET
BASOS PCT: 0.8 % (ref 0.0–3.0)
Basophils Absolute: 0.1 10*3/uL (ref 0.0–0.1)
EOS ABS: 0.3 10*3/uL (ref 0.0–0.7)
Eosinophils Relative: 4.5 % (ref 0.0–5.0)
HEMATOCRIT: 42 % (ref 39.0–52.0)
Hemoglobin: 14.4 g/dL (ref 13.0–17.0)
LYMPHS PCT: 29 % (ref 12.0–46.0)
Lymphs Abs: 1.9 10*3/uL (ref 0.7–4.0)
MCHC: 34.3 g/dL (ref 30.0–36.0)
MCV: 84.1 fl (ref 78.0–100.0)
MONO ABS: 0.5 10*3/uL (ref 0.1–1.0)
Monocytes Relative: 7.4 % (ref 3.0–12.0)
NEUTROS ABS: 3.8 10*3/uL (ref 1.4–7.7)
Neutrophils Relative %: 58.3 % (ref 43.0–77.0)
PLATELETS: 232 10*3/uL (ref 150.0–400.0)
RBC: 4.99 Mil/uL (ref 4.22–5.81)
RDW: 13.6 % (ref 11.5–15.5)
WBC: 6.6 10*3/uL (ref 4.0–10.5)

## 2018-08-19 LAB — BASIC METABOLIC PANEL
BUN: 17 mg/dL (ref 6–23)
CALCIUM: 9.8 mg/dL (ref 8.4–10.5)
CO2: 26 mEq/L (ref 19–32)
Chloride: 106 mEq/L (ref 96–112)
Creatinine, Ser: 1.02 mg/dL (ref 0.40–1.50)
GFR: 75.04 mL/min (ref >60.00)
Glucose, Bld: 103 mg/dL — ABNORMAL HIGH (ref 70–99)
POTASSIUM: 4.1 meq/L (ref 3.5–5.1)
Sodium: 140 mEq/L (ref 135–145)

## 2018-08-19 LAB — URIC ACID: Uric Acid, Serum: 7.8 mg/dL (ref 4.0–7.8)

## 2018-08-19 LAB — HEMOGLOBIN A1C: HEMOGLOBIN A1C: 5.9 % (ref 4.6–6.5)

## 2018-08-21 ENCOUNTER — Telehealth: Payer: Self-pay | Admitting: Internal Medicine

## 2018-08-21 NOTE — Telephone Encounter (Signed)
Pt. Called for lab results; see result note.  Stated he feels his BP is of more concern to him.  Reported he is taking Metoprolol 25 mg, 1.5 mg BID.  Reported BP 138/82, P.70 on 11/4, and BP 162/88, P. 71 today.    Also, reported update on his right foot, that was affected by Gout; reported the bottom of the right great toe has skin sloughing off. Stated it looks similar to Athlete's foot, but it doesn't itch.  Stated the right foot has "vastly improved" since his flare up with Gout.  Stated there is "lingering puffiness" in right foot, from top of right foot to the back of the ankle; improves overnight, and increases throughout the day.  Denied redness, warmth, or pain in the right foot.  Advised will send not to PCP, to make aware of the above information.

## 2018-08-22 NOTE — Telephone Encounter (Signed)
Please advise Dr Panosh, thanks.   

## 2018-08-27 ENCOUNTER — Ambulatory Visit: Payer: Medicare Other | Admitting: Pulmonary Disease

## 2018-08-28 NOTE — Telephone Encounter (Signed)
Noted   Message from last week  Keep [place] informed about his BP readings  And can also  Bring concerns to cardiology when he sees them  If other  Treatment is advised

## 2018-08-28 NOTE — Telephone Encounter (Signed)
ATC, voicemail full, WCB

## 2018-08-29 ENCOUNTER — Encounter: Payer: Self-pay | Admitting: Pulmonary Disease

## 2018-08-29 ENCOUNTER — Ambulatory Visit: Payer: Medicare Other | Admitting: Pulmonary Disease

## 2018-08-29 DIAGNOSIS — I480 Paroxysmal atrial fibrillation: Secondary | ICD-10-CM | POA: Diagnosis not present

## 2018-08-29 DIAGNOSIS — G4733 Obstructive sleep apnea (adult) (pediatric): Secondary | ICD-10-CM

## 2018-08-29 NOTE — Assessment & Plan Note (Signed)
He is settling down well with your CPAP machine. Few central apneas are present. We will tweak settings and increase CPAP to 10 to 14 cm.  Trial of air fit F 30 fullface mask to prevent bags under your eyes  Weight loss encouraged, compliance with goal of at least 4-6 hrs every night is the expectation. Advised against medications with sedative side effects Cautioned against driving when sleepy - understanding that sleepiness will vary on a day to day basis

## 2018-08-29 NOTE — Addendum Note (Signed)
Addended by: Valerie Salts on: 08/29/2018 04:18 PM   Modules accepted: Orders

## 2018-08-29 NOTE — Assessment & Plan Note (Signed)
Benefits for atrial fibrillation were discussed. Weight loss encouraged

## 2018-08-29 NOTE — Patient Instructions (Signed)
You are settling down well with your CPAP machine. Few central apneas are present. We will tweak settings and increase CPAP to 10 to 14 cm.  Trial of air fit F 30 fullface mask to prevent bags under your eyes

## 2018-08-29 NOTE — Progress Notes (Signed)
   Subjective:    Patient ID: Bryan Cabrera, male    DOB: [birth year], 77 y.o.   MRN: [id number]  HPI  77 year old with new onset A. fib for FU of OSA  He presented with witnessed apneas and loud snoring.  Surprisingly his home sleep test showed severe OSA.  CPAP titration study showed treatment emergent central apneas We set him up with auto CPAP 6 to 14 cm and he seems to be slowly settling down with this.  He developed a cough and did not use it for a few days. Reports bags under his eyes but otherwise is able to tolerate the machine and is able to not use it for about 6 hours. This was objectively confirmed on download which shows good compliance average 5.5 hours, residual AHI of 16/hour with central 6/hour but also few obstructive events with average pressure of 14 cm, minimal leak    Significant tests/ events reviewed  HST 05/2018 severe, AHI 51/h CPAP 06/2018 >>> treatment emergent centrals , up to CPAP 14 cm full facemask Review of Systems Patient denies significant dyspnea,cough, hemoptysis,  chest pain, palpitations, pedal edema, orthopnea, paroxysmal nocturnal dyspnea, lightheadedness, nausea, vomiting, abdominal or  leg pains      Objective:   Physical Exam  Gen. Pleasant, obese, in no distress ENT - no lesions, no post nasal drip Neck: No JVD, no thyromegaly, no carotid bruits Lungs: no use of accessory muscles, no dullness to percussion, decreased without rales or rhonchi  Cardiovascular: Rhythm regular, heart sounds  normal, no murmurs or gallops, no peripheral edema Musculoskeletal: No deformities, no cyanosis or clubbing , no tremors       Assessment & Plan:

## 2018-09-02 ENCOUNTER — Encounter: Payer: Self-pay | Admitting: Cardiology

## 2018-09-02 ENCOUNTER — Ambulatory Visit: Payer: Medicare Other | Admitting: Cardiology

## 2018-09-02 VITALS — BP 136/68 | HR 59 | Ht 68.5 in | Wt 209.8 lb

## 2018-09-02 DIAGNOSIS — G4733 Obstructive sleep apnea (adult) (pediatric): Secondary | ICD-10-CM

## 2018-09-02 DIAGNOSIS — I4819 Other persistent atrial fibrillation: Secondary | ICD-10-CM | POA: Diagnosis not present

## 2018-09-02 DIAGNOSIS — I10 Essential (primary) hypertension: Secondary | ICD-10-CM
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
## 2018-09-02 NOTE — Patient Instructions (Signed)
Medication Instructions:  The current medical regimen is effective;  continue present plan and medications.  If you need a refill on your cardiac medications before your next appointment, please call your pharmacy.   Follow-Up: At [facility], you and your health needs are our priority.  As part of our continuing mission to provide you with exceptional heart care, we have created designated Provider Care Teams.  These Care Teams include your primary Cardiologist (physician) and Advanced Practice Providers (APPs -  Physician Assistants and Nurse Practitioners) who all work together to provide you with the care you need, when you need it. You will need a follow up appointment in 6 months with Laura Ingold, NP and Dr Skains in 1 year.  Please call our office 2 months in advance to schedule this appointment.  You may see Mark Skains, MD or one of the following Advanced Practice Providers on your designated Care Team:   Lori Gerhardt, NP Laura Ingold, NP . Jill McDaniel, NP  Thank you for choosing [facility]!!      

## 2018-09-03 ENCOUNTER — Telehealth: Payer: Self-pay | Admitting: Pulmonary Disease

## 2018-09-03 DIAGNOSIS — G4733 Obstructive sleep apnea (adult) (pediatric): Secondary | ICD-10-CM

## 2018-09-03 NOTE — Telephone Encounter (Signed)
Called Bryan Cabrera, unable to reach left message to give [place] a call back.

## 2018-09-03 NOTE — Telephone Encounter (Signed)
Patient is calling back to review labs and response from provider- did review provider response. Patient states he had good appointment with cardiology yesterday.  Patient reports he had good BP reading at appointment yesterday- 136/68 P 59. Patient reports his high readings are normally in the mornings with no other symptoms.  He takes his medications:  1/2 tab HCTZ, 1 1/2 tab metoprolol, and Eliquis in am ( 7:30) Metoprolol and Eliquis at 7:30-8 pm Benicar at hs Patient using CPAP Concern is the elevation in am  Patient reports he is still having gout pain in toe joint. It is puffy and for the last 2 day he has noticed a rash on the top of his foot with itching. Offered appointment tomorrow- but patient declined. Appointment made for Monday- since he needs 30 minute time slot.

## 2018-09-03 NOTE — Telephone Encounter (Signed)
Will send to Dr Panosh as FYI 

## 2018-09-04 NOTE — Telephone Encounter (Signed)
Spoke with Estill Bamberg since Tracee was with a patient. Eunice Blase that the incorrect pressure setting had been sent over on the 15th. I advised her that I would send the correct order today. She verbalized understanding. Nothing further needed at time of call.

## 2018-09-09 ENCOUNTER — Ambulatory Visit: Payer: Medicare Other | Admitting: Internal Medicine

## 2018-09-09 ENCOUNTER — Encounter: Payer: Self-pay | Admitting: Internal Medicine

## 2018-09-09 VITALS — BP 164/70 | HR 57 | Temp 97.3°F | Wt 208.7 lb

## 2018-09-09 DIAGNOSIS — R21 Rash and other nonspecific skin eruption: Secondary | ICD-10-CM

## 2018-09-09 DIAGNOSIS — R7301 Impaired fasting glucose: Secondary | ICD-10-CM

## 2018-09-09 DIAGNOSIS — I10 Essential (primary) hypertension: Secondary | ICD-10-CM | POA: Diagnosis not present

## 2018-09-09 DIAGNOSIS — M79671 Pain in right foot: Secondary | ICD-10-CM | POA: Diagnosis not present

## 2018-09-09 DIAGNOSIS — Z79899 Other long term (current) drug therapy: Secondary | ICD-10-CM

## 2018-09-09 MED ORDER — AMLODIPINE BESYLATE 2.5 MG PO TABS: 2.5000 mg | ORAL_TABLET | Freq: Every day | ORAL | 2 refills | Status: DC

## 2018-09-09 MED ORDER — TRIAMCINOLONE ACETONIDE 0.1 % EX CREA: 1.0000 "application " | TOPICAL_CREAM | Freq: Two times a day (BID) | CUTANEOUS | 0 refills | Status: DC
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
## 2018-09-09 NOTE — Patient Instructions (Addendum)
  Dash eating .      If bp   Is consistently   150 and above , add  low dose trial of  Amlodipine 2.5 mg for at least a month to see effect and  Fu in 2 months   . If you are getting poss side effects then contact  us about this  Can get opinion about your foot   With dr Tamala Julian   As discussed .   Can use  Topical  For the rash     Lab Results  Component Value Date   WBC 6.6 08/19/2018   HGB 14.4 08/19/2018   HCT 42.0 08/19/2018   PLT 232.0 08/19/2018   GLUCOSE 103 (H) 08/19/2018   CHOL 158 01/31/2018   TRIG 199.0 (H) 01/31/2018   HDL 37.90 (L) 01/31/2018   LDLDIRECT 89.0 11/10/2014   LDLCALC 81 01/31/2018   ALT 15 01/31/2018   AST 16 01/31/2018   NA 140 08/19/2018   K 4.1 08/19/2018   CL 106 08/19/2018   CREATININE 1.02 08/19/2018   BUN 17 08/19/2018   CO2 26 08/19/2018   TSH 3.40 01/31/2018   PSA 1.43 04/13/2017   HGBA1C 5.9 08/19/2018

## 2018-09-11 ENCOUNTER — Other Ambulatory Visit: Payer: Self-pay | Admitting: Internal Medicine

## 2018-09-16 ENCOUNTER — Other Ambulatory Visit (HOSPITAL_COMMUNITY): Payer: Self-pay | Admitting: *Deleted

## 2018-09-16 MED ORDER — APIXABAN 5 MG PO TABS: 5.0000 mg | ORAL_TABLET | Freq: Two times a day (BID) | ORAL | 6 refills | Status: DC

## 2018-09-17 NOTE — Telephone Encounter (Signed)
Pharmacy called to check status of refill. They state rx needs to be changed as per pt he is taking 1.5 tabs qd.

## 2018-09-19 NOTE — Telephone Encounter (Signed)
Pharmacy called again to check status of medication. Per chart pt stated 09/02/18 he was not taking this medication??

## 2018-09-20 MED ORDER — METOPROLOL TARTRATE 25 MG PO TABS: 37.5000 mg | ORAL_TABLET | Freq: Two times a day (BID) | ORAL | 5 refills | Status: DC

## 2018-09-20 NOTE — Addendum Note (Signed)
Addended by: Virl Cagey on: 09/20/2018 09:23 AM   Modules accepted: Orders

## 2018-09-20 NOTE — Telephone Encounter (Signed)
Per previous visit with Dr Ethlyn Gallery and Dr Regis Bill, Metoprolol was increased to 1.5 tabs.  Rx sent to reflect this change. Pt aware. Nothing further needed.

## 2018-11-13 ENCOUNTER — Ambulatory Visit: Payer: Medicare Other | Admitting: Internal Medicine

## 2018-11-13 ENCOUNTER — Encounter: Payer: Self-pay | Admitting: Internal Medicine

## 2018-11-13 VITALS — BP 134/68 | HR 66 | Temp 98.3°F | Wt 212.1 lb

## 2018-11-13 DIAGNOSIS — Z23 Encounter for immunization: Secondary | ICD-10-CM

## 2018-11-13 DIAGNOSIS — Z8739 Personal history of other diseases of the musculoskeletal system and connective tissue: Secondary | ICD-10-CM | POA: Diagnosis not present

## 2018-11-13 DIAGNOSIS — I480 Paroxysmal atrial fibrillation: Secondary | ICD-10-CM

## 2018-11-13 DIAGNOSIS — R7301 Impaired fasting glucose: Secondary | ICD-10-CM

## 2018-11-13 DIAGNOSIS — Z79899 Other long term (current) drug therapy: Secondary | ICD-10-CM | POA: Diagnosis not present

## 2018-11-13 DIAGNOSIS — Z7189 Other specified counseling: Secondary | ICD-10-CM

## 2018-11-13 DIAGNOSIS — I10 Essential (primary) hypertension: Secondary | ICD-10-CM

## 2018-11-13 DIAGNOSIS — Z7185 Encounter for immunization safety counseling: Secondary | ICD-10-CM

## 2018-11-13 DIAGNOSIS — Z7901 Long term (current) use of anticoagulants: Secondary | ICD-10-CM

## 2018-11-13 MED ORDER — HYDROCHLOROTHIAZIDE 25 MG PO TABS: 25.0000 mg | ORAL_TABLET | Freq: Every day | ORAL | 3 refills | Status: DC
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
## 2018-11-13 NOTE — Addendum Note (Signed)
Addended by: Modena Morrow R on: 11/13/2018 04:32 PM   Modules accepted: Orders

## 2018-11-13 NOTE — Patient Instructions (Addendum)
Stop the amlodipine .   because of the swelling.  Take the whole  hctz  Pill diuretic .   Stay on other meds for now.  Check bp readings at different times.    rov in 2+    months  Labs and  cpx  End march early April  Lab pre visit

## 2018-11-27 ENCOUNTER — Other Ambulatory Visit: Payer: Self-pay | Admitting: Internal Medicine

## 2018-12-06 ENCOUNTER — Ambulatory Visit: Payer: Medicare Other | Admitting: Pulmonary Disease

## 2019-01-02 ENCOUNTER — Encounter: Payer: Self-pay | Admitting: Internal Medicine

## 2019-01-13 ENCOUNTER — Other Ambulatory Visit: Payer: Medicare Other

## 2019-01-14 ENCOUNTER — Ambulatory Visit: Payer: Medicare Other | Admitting: Pulmonary Disease

## 2019-01-14 ENCOUNTER — Other Ambulatory Visit: Payer: Medicare Other

## 2019-01-21 ENCOUNTER — Encounter: Payer: Medicare Other | Admitting: Internal Medicine

## 2019-02-06 ENCOUNTER — Ambulatory Visit: Payer: Medicare Other | Admitting: Pulmonary Disease

## 2019-04-10 ENCOUNTER — Other Ambulatory Visit (HOSPITAL_COMMUNITY): Payer: Self-pay | Admitting: *Deleted

## 2019-04-10 ENCOUNTER — Other Ambulatory Visit: Payer: Self-pay | Admitting: Internal Medicine

## 2019-04-10 MED ORDER — APIXABAN 5 MG PO TABS: 5.0000 mg | ORAL_TABLET | Freq: Two times a day (BID) | ORAL | 6 refills | Status: DC

## 2019-04-16 ENCOUNTER — Other Ambulatory Visit: Payer: Self-pay

## 2019-04-16 ENCOUNTER — Telehealth: Payer: Self-pay | Admitting: Internal Medicine

## 2019-04-16 MED ORDER — OLMESARTAN MEDOXOMIL 40 MG PO TABS: 40.0000 mg | ORAL_TABLET | Freq: Every day | ORAL | 3 refills | Status: DC

## 2019-04-16 NOTE — Telephone Encounter (Signed)
Medication: olmesartan (BENICAR) 40 MG tablet  Has the patient contacted their pharmacy? Yes.   (Agent: If no, request that the patient contact the pharmacy for the refill.) (Agent: If yes, when and what did the pharmacy advise?)  Preferred Pharmacy (with phone number or street name): [facility], [place] [phone] (Phone) [phone] (Fax)    Agent: Please be advised that RX refills may take up to 3 business days. We ask that you follow-up with your pharmacy.

## 2019-04-16 NOTE — Telephone Encounter (Signed)
Refill has been sent in.  

## 2019-05-09 ENCOUNTER — Other Ambulatory Visit (INDEPENDENT_AMBULATORY_CARE_PROVIDER_SITE_OTHER): Payer: Medicare Other

## 2019-05-09 ENCOUNTER — Other Ambulatory Visit: Payer: Self-pay

## 2019-05-09 DIAGNOSIS — I10 Essential (primary) hypertension: Secondary | ICD-10-CM | POA: Diagnosis not present

## 2019-05-09 DIAGNOSIS — R7301 Impaired fasting glucose: Secondary | ICD-10-CM

## 2019-05-09 DIAGNOSIS — Z8739 Personal history of other diseases of the musculoskeletal system and connective tissue: Secondary | ICD-10-CM | POA: Diagnosis not present

## 2019-05-09 DIAGNOSIS — Z79899 Other long term (current) drug therapy: Secondary | ICD-10-CM | POA: Diagnosis not present

## 2019-05-09 LAB — BASIC METABOLIC PANEL
BUN: 14 mg/dL (ref 6–23)
CO2: 31 mEq/L (ref 19–32)
Calcium: 10.2 mg/dL (ref 8.4–10.5)
Chloride: 104 mEq/L (ref 96–112)
Creatinine, Ser: 1.03 mg/dL (ref 0.40–1.50)
GFR: 69.68 mL/min (ref >60.00)
Glucose, Bld: 106 mg/dL — ABNORMAL HIGH (ref 70–99)
Potassium: 4.1 mEq/L (ref 3.5–5.1)
Sodium: 141 mEq/L (ref 135–145)

## 2019-05-09 LAB — HEMOGLOBIN A1C: Hgb A1c MFr Bld: 5.8 % (ref 4.6–6.5)

## 2019-05-09 LAB — CBC WITH DIFFERENTIAL/PLATELET
Basophils Absolute: 0.1 10*3/uL (ref 0.0–0.1)
Basophils Relative: 0.9 % (ref 0.0–3.0)
Eosinophils Absolute: 0.3 10*3/uL (ref 0.0–0.7)
Eosinophils Relative: 4.5 % (ref 0.0–5.0)
HCT: 41 % (ref 39.0–52.0)
Hemoglobin: 14 g/dL (ref 13.0–17.0)
Lymphocytes Relative: 32.6 % (ref 12.0–46.0)
Lymphs Abs: 2.1 10*3/uL (ref 0.7–4.0)
MCHC: 34 g/dL (ref 30.0–36.0)
MCV: 85.8 fl (ref 78.0–100.0)
Monocytes Absolute: 0.7 10*3/uL (ref 0.1–1.0)
Monocytes Relative: 11.4 % (ref 3.0–12.0)
Neutro Abs: 3.3 10*3/uL (ref 1.4–7.7)
Neutrophils Relative %: 50.6 % (ref 43.0–77.0)
Platelets: 192 10*3/uL (ref 150.0–400.0)
RBC: 4.78 Mil/uL (ref 4.22–5.81)
RDW: 13.2 % (ref 11.5–15.5)
WBC: 6.5 10*3/uL (ref 4.0–10.5)

## 2019-05-09 LAB — LIPID PANEL
Cholesterol: 158 mg/dL (ref 0–200)
HDL: 39.6 mg/dL (ref >39.00)
NonHDL: 118.06
Total CHOL/HDL Ratio: 4
Triglycerides: 263 mg/dL — ABNORMAL HIGH (ref 0.0–149.0)
VLDL: 52.6 mg/dL — ABNORMAL HIGH (ref 0.0–40.0)

## 2019-05-09 LAB — HEPATIC FUNCTION PANEL
ALT: 21 U/L (ref 0–53)
AST: 19 U/L (ref 0–37)
Albumin: 4.3 g/dL (ref 3.5–5.2)
Alkaline Phosphatase: 47 U/L (ref 39–117)
Bilirubin, Direct: 0.1 mg/dL (ref 0.0–0.3)
Total Bilirubin: 0.7 mg/dL (ref 0.2–1.2)
Total Protein: 6.5 g/dL (ref 6.0–8.3)

## 2019-05-09 LAB — URIC ACID: Uric Acid, Serum: 9.5 mg/dL — ABNORMAL HIGH (ref 4.0–7.8)

## 2019-05-09 LAB — LDL CHOLESTEROL, DIRECT: Direct LDL: 82 mg/dL

## 2019-05-16 ENCOUNTER — Encounter: Payer: Self-pay | Admitting: Internal Medicine

## 2019-05-16 ENCOUNTER — Other Ambulatory Visit: Payer: Self-pay

## 2019-05-16 ENCOUNTER — Ambulatory Visit (INDEPENDENT_AMBULATORY_CARE_PROVIDER_SITE_OTHER): Payer: Medicare Other | Admitting: Internal Medicine

## 2019-05-16 VITALS — BP 132/64 | HR 55 | Temp 98.3°F | Ht 68.5 in | Wt 214.4 lb

## 2019-05-16 DIAGNOSIS — I10 Essential (primary) hypertension: Secondary | ICD-10-CM | POA: Diagnosis not present

## 2019-05-16 DIAGNOSIS — R7301 Impaired fasting glucose: Secondary | ICD-10-CM

## 2019-05-16 DIAGNOSIS — Z8739 Personal history of other diseases of the musculoskeletal system and connective tissue: Secondary | ICD-10-CM | POA: Diagnosis not present

## 2019-05-16 DIAGNOSIS — Z0000 Encounter for general adult medical examination without abnormal findings: Secondary | ICD-10-CM

## 2019-05-16 DIAGNOSIS — Z79899 Other long term (current) drug therapy: Secondary | ICD-10-CM

## 2019-05-16 DIAGNOSIS — K130 Diseases of lips: Secondary | ICD-10-CM

## 2019-05-16 DIAGNOSIS — G4733 Obstructive sleep apnea (adult) (pediatric): Secondary | ICD-10-CM

## 2019-05-16 DIAGNOSIS — E790 Hyperuricemia without signs of inflammatory arthritis and tophaceous disease: Secondary | ICD-10-CM | POA: Diagnosis not present

## 2019-05-16 DIAGNOSIS — Z7901 Long term (current) use of anticoagulants: Secondary | ICD-10-CM

## 2019-05-16 DIAGNOSIS — E781 Pure hyperglyceridemia: Secondary | ICD-10-CM

## 2019-05-16 MED ORDER — HYDROCHLOROTHIAZIDE 25 MG PO TABS: 25.0000 mg | ORAL_TABLET | Freq: Every day | ORAL | 3 refills | Status: DC

## 2019-05-16 NOTE — Patient Instructions (Addendum)
Your  Uric acid is elevated  And could be contributing to  Risk of gout attack.  consideration of adding medication to suppress.   The uric acid level .  Losing some weight .  In a healthy manner may be helpful    [email or web address]  Look for low purine diet  Healthy DASH eating is helpful also     DASH Eating Plan DASH stands for "Dietary Approaches to Stop Hypertension." The DASH eating plan is a healthy eating plan that has been shown to reduce high blood pressure (hypertension). It may also reduce your risk for type 2 diabetes, heart disease, and stroke. The DASH eating plan may also help with weight loss. What are tips for following this plan?  General guidelines  Avoid eating more than 2,300 mg (milligrams) of salt (sodium) a day. If you have hypertension, you may need to reduce your sodium intake to 1,500 mg a day.  Limit alcohol intake to no more than 1 drink a day for nonpregnant women and 2 drinks a day for men. One drink equals 12 oz of beer, 5 oz of wine, or 1 oz of hard liquor.  Work with your health care provider to maintain a healthy body weight or to lose weight. Ask what an ideal weight is for you.  Get at least 30 minutes of exercise that causes your heart to beat faster (aerobic exercise) most days of the week. Activities may include walking, swimming, or biking.  Work with your health care provider or diet and nutrition specialist (dietitian) to adjust your eating plan to your individual calorie needs. Reading food labels   Check food labels for the amount of sodium per serving. Choose foods with less than 5 percent of the Daily Value of sodium. Generally, foods with less than 300 mg of sodium per serving fit into this eating plan.  To find whole grains, look for the word "whole" as the first word in the ingredient list. Shopping  Buy products labeled as "low-sodium" or  "no salt added."  Buy fresh foods. Avoid canned foods and premade or frozen meals. Cooking  Avoid adding salt when cooking. Use salt-free seasonings or herbs instead of table salt or sea salt. Check with your health care provider or pharmacist before using salt substitutes.  Do not fry foods. Cook foods using healthy methods such as baking, boiling, grilling, and broiling instead.  Cook with heart-healthy oils, such as olive, canola, soybean, or sunflower oil. Meal planning  Eat a balanced diet that includes: ? 5 or more servings of fruits and vegetables each day. At each meal, try to fill half of your plate with fruits and vegetables. ? Up to 6-8 servings of whole grains each day. ? Less than 6 oz of lean meat, poultry, or fish each day. A 3-oz serving of meat is about the same size as a deck of cards. One egg equals 1 oz. ? 2 servings of low-fat dairy each day. ? A serving of nuts, seeds, or beans 5 times each week. ? Heart-healthy fats. Healthy fats called Omega-3 fatty acids are found in foods such as flaxseeds and coldwater fish, like sardines, salmon, and mackerel.  Limit how much you eat of the following: ? Canned or prepackaged foods. ? Food that is high in trans fat, such as fried foods. ? Food that is high in saturated fat, such as fatty meat. ? Sweets, desserts, sugary drinks, and other foods with added sugar. ? Full-fat dairy products.  Do  not salt foods before eating.  Try to eat at least 2 vegetarian meals each week.  Eat more home-cooked food and less restaurant, buffet, and fast food.  When eating at a restaurant, ask that your food be prepared with less salt or no salt, if possible. What foods are recommended? The items listed may not be a complete list. Talk with your dietitian about what dietary choices are best for you. Grains Whole-grain or whole-wheat bread. Whole-grain or whole-wheat pasta. Brown rice. Modena Morrow. Bulgur. Whole-grain and low-sodium  cereals. Pita bread. Low-fat, low-sodium crackers. Whole-wheat flour tortillas. Vegetables Fresh or frozen vegetables (raw, steamed, roasted, or grilled). Low-sodium or reduced-sodium tomato and vegetable juice. Low-sodium or reduced-sodium tomato sauce and tomato paste. Low-sodium or reduced-sodium canned vegetables. Fruits All fresh, dried, or frozen fruit. Canned fruit in natural juice (without added sugar). Meat and other protein foods Skinless chicken or [place]. Ground chicken or [place]. Pork with fat trimmed off. Fish and seafood. Egg whites. Dried beans, peas, or lentils. Unsalted nuts, nut butters, and seeds. Unsalted canned beans. Lean cuts of beef with fat trimmed off. Low-sodium, lean deli meat. Dairy Low-fat (1%) or fat-free (skim) milk. Fat-free, low-fat, or reduced-fat cheeses. Nonfat, low-sodium ricotta or cottage cheese. Low-fat or nonfat yogurt. Low-fat, low-sodium cheese. Fats and oils Soft margarine without trans fats. Vegetable oil. Low-fat, reduced-fat, or light mayonnaise and salad dressings (reduced-sodium). Canola, safflower, olive, soybean, and sunflower oils. Avocado. Seasoning and other foods Herbs. Spices. Seasoning mixes without salt. Unsalted popcorn and pretzels. Fat-free sweets. What foods are not recommended? The items listed may not be a complete list. Talk with your dietitian about what dietary choices are best for you. Grains Baked goods made with fat, such as croissants, muffins, or some breads. Dry pasta or rice meal packs. Vegetables Creamed or fried vegetables. Vegetables in a cheese sauce. Regular canned vegetables (not low-sodium or reduced-sodium). Regular canned tomato sauce and paste (not low-sodium or reduced-sodium). Regular tomato and vegetable juice (not low-sodium or reduced-sodium). Angie Fava. Olives. Fruits Canned fruit in a light or heavy syrup. Fried fruit. Fruit in cream or butter sauce. Meat and other protein foods Fatty cuts of meat. Ribs.  Fried meat. Berniece Salines. Sausage. Bologna and other processed lunch meats. Salami. Fatback. Hotdogs. Bratwurst. Salted nuts and seeds. Canned beans with added salt. Canned or smoked fish. Whole eggs or egg yolks. Chicken or [place] with skin. Dairy Whole or 2% milk, cream, and half-and-half. Whole or full-fat cream cheese. Whole-fat or sweetened yogurt. Full-fat cheese. Nondairy creamers. Whipped toppings. Processed cheese and cheese spreads. Fats and oils Butter. Stick margarine. Lard. Shortening. Ghee. Bacon fat. Tropical oils, such as coconut, palm kernel, or palm oil. Seasoning and other foods Salted popcorn and pretzels. Onion salt, garlic salt, seasoned salt, table salt, and sea salt. Worcestershire sauce. Tartar sauce. Barbecue sauce. Teriyaki sauce. Soy sauce, including reduced-sodium. Steak sauce. Canned and packaged gravies. Fish sauce. Oyster sauce. Cocktail sauce. Horseradish that you find on the shelf. Ketchup. Mustard. Meat flavorings and tenderizers. Bouillon cubes. Hot sauce and Tabasco sauce. Premade or packaged marinades. Premade or packaged taco seasonings. Relishes. Regular salad dressings. Where to find more information:  National Heart, Lung, and [place]: https://wilson-eaton.com/  American Heart Association: www.heart.org Summary  The DASH eating plan is a healthy eating plan that has been shown to reduce high blood pressure (hypertension). It may also reduce your risk for type 2 diabetes, heart disease, and stroke.  With the DASH eating plan, you should limit salt (sodium) intake  to 2,300 mg a day. If you have hypertension, you may need to reduce your sodium intake to 1,500 mg a day.  When on the DASH eating plan, aim to eat more fresh fruits and vegetables, whole grains, lean proteins, low-fat dairy, and heart-healthy fats.  Work with your health care provider or diet and nutrition specialist (dietitian) to adjust your eating plan to your individual calorie needs. This  information is not intended to replace advice given to you by your health care provider. Make sure you discuss any questions you have with your health care provider. Document Released: 09/21/2011 Document Revised: 09/14/2017 Document Reviewed: 09/25/2016 Elsevier Patient Education  2020 [employer].
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
## 2019-08-22 ENCOUNTER — Other Ambulatory Visit: Payer: Medicare Other

## 2019-08-29 ENCOUNTER — Other Ambulatory Visit (INDEPENDENT_AMBULATORY_CARE_PROVIDER_SITE_OTHER): Payer: Medicare Other

## 2019-08-29 ENCOUNTER — Other Ambulatory Visit: Payer: Self-pay

## 2019-08-29 DIAGNOSIS — Z23 Encounter for immunization: Secondary | ICD-10-CM

## 2019-08-29 DIAGNOSIS — E790 Hyperuricemia without signs of inflammatory arthritis and tophaceous disease: Secondary | ICD-10-CM

## 2019-08-29 DIAGNOSIS — Z8739 Personal history of other diseases of the musculoskeletal system and connective tissue: Secondary | ICD-10-CM | POA: Diagnosis not present

## 2019-08-29 LAB — BASIC METABOLIC PANEL
BUN: 13 mg/dL (ref 6–23)
CO2: 29 mEq/L (ref 19–32)
Calcium: 10.1 mg/dL (ref 8.4–10.5)
Chloride: 104 mEq/L (ref 96–112)
Creatinine, Ser: 1.09 mg/dL (ref 0.40–1.50)
GFR: 65.22 mL/min (ref >60.00)
Glucose, Bld: 136 mg/dL — ABNORMAL HIGH (ref 70–99)
Potassium: 3.7 mEq/L (ref 3.5–5.1)
Sodium: 141 mEq/L (ref 135–145)

## 2019-08-29 LAB — URIC ACID: Uric Acid, Serum: 9 mg/dL — ABNORMAL HIGH (ref 4.0–7.8)

## 2019-10-03 ENCOUNTER — Other Ambulatory Visit: Payer: Self-pay | Admitting: Internal Medicine

## 2019-10-11 ENCOUNTER — Other Ambulatory Visit: Payer: Self-pay | Admitting: Internal Medicine

## 2019-10-27 ENCOUNTER — Encounter: Payer: Self-pay | Admitting: *Deleted

## 2019-10-27 ENCOUNTER — Telehealth: Payer: Self-pay | Admitting: Internal Medicine

## 2019-10-27 NOTE — Telephone Encounter (Signed)
No  Specific concerns have been noted with this vaccine with  people on blood thinner s.    So go ahead and get the vaccine

## 2019-10-27 NOTE — Telephone Encounter (Signed)
Pt called to inquire about what he has heard about A Pt taking eliquis and getting the covid vaccine / Please advise Pt if they can get covid vaccine while taking Eliquis

## 2019-10-27 NOTE — Telephone Encounter (Signed)
no

## 2019-10-27 NOTE — Telephone Encounter (Signed)
Patient sent my chart message with response per Dr. Regis Bill.

## 2019-11-10 ENCOUNTER — Other Ambulatory Visit (HOSPITAL_COMMUNITY): Payer: Self-pay | Admitting: Nurse Practitioner

## 2019-11-10 NOTE — Telephone Encounter (Signed)
Prescription refill request for Eliquis received.  Last office visit: 09/02/2018, Skains Scr: 1.09, 08/29/2019 Age: 79 y.o. Weight: 97.3 kg    Called and spoke to pt. Informed him that to continue refilling medication pt needs follow up visit with Dr. [name] or another provider. Transferred pt to the main line to schedule an appointment.

## 2019-11-10 NOTE — Telephone Encounter (Signed)
Office visit scheduled with Dr. [name]. Will refill medication and give him a 1 month supply.

## 2019-11-26 ENCOUNTER — Other Ambulatory Visit: Payer: Self-pay

## 2019-11-26 ENCOUNTER — Ambulatory Visit: Payer: Medicare PPO | Admitting: Cardiology

## 2019-11-26 ENCOUNTER — Encounter: Payer: Self-pay | Admitting: Cardiology

## 2019-11-26 VITALS — BP 160/70 | HR 59 | Ht 68.5 in | Wt 222.0 lb

## 2019-11-26 DIAGNOSIS — I10 Essential (primary) hypertension: Secondary | ICD-10-CM | POA: Diagnosis not present

## 2019-11-26 DIAGNOSIS — I4819 Other persistent atrial fibrillation: Secondary | ICD-10-CM | POA: Diagnosis not present

## 2019-11-26 DIAGNOSIS — G4733 Obstructive sleep apnea (adult) (pediatric): Secondary | ICD-10-CM

## 2019-11-26 MED ORDER — APIXABAN 5 MG PO TABS: ORAL_TABLET | ORAL | 11 refills | Status: DC
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
## 2019-11-26 NOTE — Addendum Note (Signed)
Addended by: Shellia Cleverly on: 11/26/2019 05:46 PM   Modules accepted: Orders

## 2019-11-26 NOTE — Patient Instructions (Signed)
Medication Instructions:  The current medical regimen is effective;  continue present plan and medications.  *If you need a refill on your cardiac medications before your next appointment, please call your pharmacy*  Follow-Up: At [facility], you and your health needs are our priority.  As part of our continuing mission to provide you with exceptional heart care, we have created designated Provider Care Teams.  These Care Teams include your primary Cardiologist (physician) and Advanced Practice Providers (APPs -  Physician Assistants and Nurse Practitioners) who all work together to provide you with the care you need, when you need it.  Your next appointment:   12 month(s)  The format for your next appointment:   In Person  Provider:   Mark Skains, MD   Thank you for choosing [facility]!!     

## 2019-12-09 ENCOUNTER — Other Ambulatory Visit: Payer: Self-pay

## 2019-12-09 ENCOUNTER — Encounter (HOSPITAL_COMMUNITY): Payer: Self-pay | Admitting: *Deleted

## 2019-12-09 ENCOUNTER — Emergency Department (HOSPITAL_COMMUNITY): Payer: Medicare PPO

## 2019-12-09 ENCOUNTER — Observation Stay (HOSPITAL_COMMUNITY)
Admission: EM | Admit: 2019-12-09 | Discharge: 2019-12-10 | Disposition: A | Discharge status: Home / Self Care | Payer: Medicare PPO | Attending: Family Medicine | Admitting: Family Medicine

## 2019-12-09 DIAGNOSIS — Z7901 Long term (current) use of anticoagulants: Secondary | ICD-10-CM | POA: Diagnosis not present

## 2019-12-09 DIAGNOSIS — Z880 Allergy status to penicillin: Secondary | ICD-10-CM | POA: Insufficient documentation

## 2019-12-09 DIAGNOSIS — Z23 Encounter for immunization: Secondary | ICD-10-CM | POA: Diagnosis not present

## 2019-12-09 DIAGNOSIS — Y999 Unspecified external cause status: Secondary | ICD-10-CM | POA: Insufficient documentation

## 2019-12-09 DIAGNOSIS — W19XXXA Unspecified fall, initial encounter: Secondary | ICD-10-CM | POA: Insufficient documentation

## 2019-12-09 DIAGNOSIS — Z888 Allergy status to other drugs, medicaments and biological substances status: Secondary | ICD-10-CM | POA: Diagnosis not present

## 2019-12-09 DIAGNOSIS — S7012XA Contusion of left thigh, initial encounter: Secondary | ICD-10-CM | POA: Diagnosis not present

## 2019-12-09 DIAGNOSIS — Z881 Allergy status to other antibiotic agents status: Secondary | ICD-10-CM | POA: Insufficient documentation

## 2019-12-09 DIAGNOSIS — R55 Syncope and collapse: Secondary | ICD-10-CM | POA: Diagnosis present

## 2019-12-09 DIAGNOSIS — Z87891 Personal history of nicotine dependence: Secondary | ICD-10-CM | POA: Insufficient documentation

## 2019-12-09 DIAGNOSIS — Y9301 Activity, walking, marching and hiking: Secondary | ICD-10-CM | POA: Insufficient documentation

## 2019-12-09 DIAGNOSIS — S50312A Abrasion of left elbow, initial encounter: Secondary | ICD-10-CM | POA: Insufficient documentation

## 2019-12-09 DIAGNOSIS — E785 Hyperlipidemia, unspecified: Secondary | ICD-10-CM | POA: Insufficient documentation

## 2019-12-09 DIAGNOSIS — Z20822 Contact with and (suspected) exposure to covid-19: Secondary | ICD-10-CM | POA: Insufficient documentation

## 2019-12-09 DIAGNOSIS — G4733 Obstructive sleep apnea (adult) (pediatric): Secondary | ICD-10-CM | POA: Diagnosis present

## 2019-12-09 DIAGNOSIS — I480 Paroxysmal atrial fibrillation: Secondary | ICD-10-CM | POA: Diagnosis present

## 2019-12-09 DIAGNOSIS — I4891 Unspecified atrial fibrillation: Secondary | ICD-10-CM | POA: Diagnosis present

## 2019-12-09 DIAGNOSIS — I10 Essential (primary) hypertension: Secondary | ICD-10-CM | POA: Diagnosis not present

## 2019-12-09 DIAGNOSIS — Y92009 Unspecified place in unspecified non-institutional (private) residence as the place of occurrence of the external cause: Secondary | ICD-10-CM | POA: Insufficient documentation

## 2019-12-09 DIAGNOSIS — Z79899 Other long term (current) drug therapy: Secondary | ICD-10-CM | POA: Diagnosis not present

## 2019-12-09 DIAGNOSIS — I4821 Permanent atrial fibrillation: Secondary | ICD-10-CM | POA: Diagnosis present

## 2019-12-09 LAB — CBC WITH DIFFERENTIAL/PLATELET
Abs Immature Granulocytes: 0.05 10*3/uL (ref 0.00–0.07)
Basophils Absolute: 0.1 10*3/uL (ref 0.0–0.1)
Basophils Relative: 0 %
Eosinophils Absolute: 0.1 10*3/uL (ref 0.0–0.5)
Eosinophils Relative: 1 %
HCT: 36.5 % — ABNORMAL LOW (ref 39.0–52.0)
Hemoglobin: 12.4 g/dL — ABNORMAL LOW (ref 13.0–17.0)
Immature Granulocytes: 0 %
Lymphocytes Relative: 19 %
Lymphs Abs: 2.5 10*3/uL (ref 0.7–4.0)
MCH: 29.3 pg (ref 26.0–34.0)
MCHC: 34 g/dL (ref 30.0–36.0)
MCV: 86.3 fL (ref 80.0–100.0)
Monocytes Absolute: 1.2 10*3/uL — ABNORMAL HIGH (ref 0.1–1.0)
Monocytes Relative: 9 %
Neutro Abs: 9.5 10*3/uL — ABNORMAL HIGH (ref 1.7–7.7)
Neutrophils Relative %: 71 %
Platelets: 305 10*3/uL (ref 150–400)
RBC: 4.23 MIL/uL (ref 4.22–5.81)
RDW: 13 % (ref 11.5–15.5)
WBC: 13.4 10*3/uL — ABNORMAL HIGH (ref 4.0–10.5)
nRBC: 0 % (ref 0.0–0.2)

## 2019-12-09 LAB — COMPREHENSIVE METABOLIC PANEL
ALT: 19 U/L (ref 0–44)
AST: 17 U/L (ref 15–41)
Albumin: 3.7 g/dL (ref 3.5–5.0)
Alkaline Phosphatase: 46 U/L (ref 38–126)
Anion gap: 10 (ref 5–15)
BUN: 23 mg/dL (ref 8–23)
CO2: 21 mmol/L — ABNORMAL LOW (ref 22–32)
Calcium: 9.2 mg/dL (ref 8.9–10.3)
Chloride: 104 mmol/L (ref 98–111)
Creatinine, Ser: 1.09 mg/dL (ref 0.61–1.24)
GFR calc Af Amer: >60 mL/min (ref >60)
GFR calc non Af Amer: >60 mL/min (ref >60)
Glucose, Bld: 132 mg/dL — ABNORMAL HIGH (ref 70–99)
Potassium: 3.7 mmol/L (ref 3.5–5.1)
Sodium: 135 mmol/L (ref 135–145)
Total Bilirubin: 0.8 mg/dL (ref 0.3–1.2)
Total Protein: 6.3 g/dL — ABNORMAL LOW (ref 6.5–8.1)

## 2019-12-09 LAB — MAGNESIUM: Magnesium: 2 mg/dL (ref 1.7–2.4)

## 2019-12-09 LAB — TYPE AND SCREEN
ABO/RH(D): A POS
Antibody Screen: NEGATIVE

## 2019-12-09 LAB — ABO/RH: ABO/RH(D): A POS

## 2019-12-09 LAB — HEMOGLOBIN AND HEMATOCRIT, BLOOD
HCT: 34 % — ABNORMAL LOW (ref 39.0–52.0)
Hemoglobin: 11.3 g/dL — ABNORMAL LOW (ref 13.0–17.0)

## 2019-12-09 LAB — CK: Total CK: 69 U/L (ref 49–397)

## 2019-12-09 LAB — PROTIME-INR
INR: 1.3 — ABNORMAL HIGH (ref 0.8–1.2)
Prothrombin Time: 16.3 seconds — ABNORMAL HIGH (ref 11.4–15.2)

## 2019-12-09 LAB — SARS CORONAVIRUS 2 (TAT 6-24 HRS): SARS Coronavirus 2: NEGATIVE

## 2019-12-09 LAB — TSH: TSH: 3.469 u[IU]/mL (ref 0.350–4.500)

## 2019-12-09 LAB — TROPONIN I (HIGH SENSITIVITY)
Troponin I (High Sensitivity): 4 ng/L (ref <18)
Troponin I (High Sensitivity): 4 ng/L (ref <18)

## 2019-12-09 MED ORDER — ACETAMINOPHEN 325 MG PO TABS
650.0000 mg | ORAL_TABLET | Freq: Once | ORAL | Status: AC
  Administered 2019-12-09: 650 mg via ORAL
  Filled 2019-12-09: qty 2

## 2019-12-09 MED ORDER — ONDANSETRON HCL 4 MG/2ML IJ SOLN: 4.0000 mg | Freq: Four times a day (QID) | INTRAMUSCULAR | Status: DC | PRN

## 2019-12-09 MED ORDER — ONDANSETRON HCL 4 MG PO TABS: 4.0000 mg | ORAL_TABLET | Freq: Four times a day (QID) | ORAL | Status: DC | PRN

## 2019-12-09 MED ORDER — TETANUS-DIPHTH-ACELL PERTUSSIS 5-2.5-18.5 LF-MCG/0.5 IM SUSP
0.5000 mL | Freq: Once | INTRAMUSCULAR | Status: AC
  Administered 2019-12-09: 0.5 mL via INTRAMUSCULAR
  Filled 2019-12-09: qty 0.5

## 2019-12-09 MED ORDER — POTASSIUM CHLORIDE IN NACL 20-0.9 MEQ/L-% IV SOLN: INTRAVENOUS | Status: AC

## 2019-12-09 MED ORDER — POLYETHYLENE GLYCOL 3350 17 G PO PACK: 17.0000 g | PACK | Freq: Every day | ORAL | Status: DC | PRN

## 2019-12-09 MED ORDER — ACETAMINOPHEN 325 MG PO TABS
650.0000 mg | ORAL_TABLET | Freq: Four times a day (QID) | ORAL | Status: DC | PRN
  Administered 2019-12-10: 650 mg via ORAL
  Filled 2019-12-09: qty 2

## 2019-12-09 MED ORDER — ACETAMINOPHEN 650 MG RE SUPP: 650.0000 mg | Freq: Four times a day (QID) | RECTAL | Status: DC | PRN
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
## 2019-12-09 NOTE — Progress Notes (Signed)
Alert and oriented. Vitals stable but unable to do orthostatic vitals due to unable to stand.  Stated that left hip began to hurt in last few days even though fall was over a week ago.  Stated unable to move left hip from side to side.
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
## 2019-12-09 NOTE — ED Triage Notes (Signed)
Pt brought in by [facility] with c/o fall a week and a half ago. Pt reports to EMS that he was walking up the stairs to his house and became dizzy and fell losing consciousness. Pt hit his head, left arm and left leg. Pt is on Eliquis. Pt came in today because of the pain in his upper left leg. Pt reports he initially did not have any swelling/pain after the fall, and then started having a "knot" to his upper left leg and while walking in the grocery store last night the pain became severe and the area became very hard and swollen.

## 2019-12-10 ENCOUNTER — Observation Stay (HOSPITAL_BASED_OUTPATIENT_CLINIC_OR_DEPARTMENT_OTHER): Payer: Medicare PPO

## 2019-12-10 ENCOUNTER — Observation Stay (HOSPITAL_COMMUNITY): Payer: Medicare PPO

## 2019-12-10 DIAGNOSIS — R55 Syncope and collapse: Secondary | ICD-10-CM | POA: Diagnosis not present

## 2019-12-10 DIAGNOSIS — I480 Paroxysmal atrial fibrillation: Secondary | ICD-10-CM

## 2019-12-10 DIAGNOSIS — G4733 Obstructive sleep apnea (adult) (pediatric): Secondary | ICD-10-CM

## 2019-12-10 DIAGNOSIS — I10 Essential (primary) hypertension: Secondary | ICD-10-CM

## 2019-12-10 LAB — CBC
HCT: 31.8 % — ABNORMAL LOW (ref 39.0–52.0)
Hemoglobin: 10.4 g/dL — ABNORMAL LOW (ref 13.0–17.0)
MCH: 29.1 pg (ref 26.0–34.0)
MCHC: 32.7 g/dL (ref 30.0–36.0)
MCV: 88.8 fL (ref 80.0–100.0)
Platelets: 252 10*3/uL (ref 150–400)
RBC: 3.58 MIL/uL — ABNORMAL LOW (ref 4.22–5.81)
RDW: 12.9 % (ref 11.5–15.5)
WBC: 11.4 10*3/uL — ABNORMAL HIGH (ref 4.0–10.5)
nRBC: 0 % (ref 0.0–0.2)

## 2019-12-10 LAB — ECHOCARDIOGRAM COMPLETE
Height: 68 in
Weight: 3358.05 oz

## 2019-12-10 MED ORDER — HYDROCODONE-ACETAMINOPHEN 5-325 MG PO TABS: 1.0000 | ORAL_TABLET | Freq: Four times a day (QID) | ORAL | 0 refills | Status: DC | PRN

## 2019-12-10 MED ORDER — ACETAMINOPHEN 325 MG PO TABS: 650.0000 mg | ORAL_TABLET | Freq: Four times a day (QID) | ORAL | 0 refills | Status: DC | PRN

## 2019-12-10 MED ORDER — SENNOSIDES-DOCUSATE SODIUM 8.6-50 MG PO TABS: 2.0000 | ORAL_TABLET | Freq: Every day | ORAL | 0 refills | Status: DC

## 2019-12-10 MED ORDER — METHOCARBAMOL 500 MG PO TABS: 750.0000 mg | ORAL_TABLET | Freq: Three times a day (TID) | ORAL | 0 refills | Status: DC

## 2019-12-10 MED ORDER — HYDROCHLOROTHIAZIDE 25 MG PO TABS: 25.0000 mg | ORAL_TABLET | Freq: Every day | ORAL | 3 refills | Status: DC

## 2019-12-10 NOTE — Progress Notes (Signed)
*  PRELIMINARY RESULTS* Echocardiogram 2D Echocardiogram has been performed.  Leavy Cella 12/10/2019, 11:24 AM

## 2019-12-10 NOTE — Progress Notes (Signed)
Discharge instructions provided. Medications reviewed. Patient had questions and Dr. Joesph Fillers was notified. He will be calling room to answer patient questions before discharge.

## 2019-12-10 NOTE — Plan of Care (Signed)
  Problem: Acute Rehab PT Goals(only PT should resolve) Goal: Pt Will Go Supine/Side To Sit Outcome: Progressing Flowsheets (Taken 12/10/2019 1421) Pt will go Supine/Side to Sit: with min guard assist Goal: Patient Will Transfer Sit To/From Stand Outcome: Progressing Flowsheets (Taken 12/10/2019 1421) Patient will transfer sit to/from stand: with min guard assist Goal: Pt Will Transfer Bed To Chair/Chair To Bed Outcome: Progressing Flowsheets (Taken 12/10/2019 1421) Pt will Transfer Bed to Chair/Chair to Bed: min guard assist Goal: Pt Will Ambulate Outcome: Progressing Flowsheets (Taken 12/10/2019 1421) Pt will Ambulate:  100 feet  with min guard assist  with rolling walker   2:22 PM, 12/10/19 Lonell Grandchild, MPT Physical Therapist with [facility] 336 [phone] office [phone] mobile phone

## 2019-12-10 NOTE — Care Management Obs Status (Signed)
[place] NOTIFICATION   Patient Details  Name: Bryan Cabrera MRN: [id number] Date of Birth: [birth year]   Medicare Observation Status Notification Given:  Yes    Tommy Medal 12/10/2019, 4:45 PM

## 2019-12-10 NOTE — Discharge Instructions (Signed)
1)You have been set up to have outpatient physical therapy sessions 2 to 3 times a week to help your gait stability and left hip pain 2)You are taking Eliquis/apixaban which is a blood thinner so please Avoid ibuprofen/Advil/Aleve/Motrin/Goody Powders/Naproxen/BC powders/Meloxicam/Diclofenac/Indomethacin and other Nonsteroidal anti-inflammatory medications as these will make you more likely to bleed and can cause stomach ulcers, can also cause Kidney problems.   3) okay to take Tylenol, methocarbamol and/or hydrocodone for pain and muscle stiffness as prescribed  4)Please continue to use your CPAP machine when you sleep at night and also when you take naps during the day  5)Please hold your Eliquis/apixaban until Tuesday, 12/16/2019--due to concerns about bleeding into your left hip after your fall  6) please hold hydrochlorothiazide/HCTZ  Until Tuesday 12/16/2019 due to concerns about possible dizziness and soft blood pressures  7)Okay to continue metoprolol and Benicar  8)Please follow-up with your primary care physician within a week for recheck and reevaluation

## 2019-12-10 NOTE — TOC Progression Note (Signed)
PT recommended SNF for pt. Met with pt who states he is not interested. 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 Discussed HH vs outpatient and pt agreeable to outpatient. Updated MD.   Pt denies any other TOC needs for dc.
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
## 2019-12-10 NOTE — Evaluation (Addendum)
Physical Therapy Evaluation Patient Details Name: Bryan Cabrera MRN: [id number] DOB: 1940/12/01 Today's Date: 12/10/2019   History of Present Illness  Bryan Cabrera is a 79 y.o. male with medical history significant for hypertension, atrial fibrillation, vertigo.  Patient presented to the ED, with complaints of a fall and passing out about a week and a half ago.  Patient was walking up the stairs from his vehicle to his house,  when he found himself on the floor-there was no prodrome, no lightheadedness or dizziness prior to fall.  He has a history of dizziness, but he denies feeling dizzy before he passed out.  He is unable to tell me how long he was out, but he does not think it was for long probably became conscious when he was on the floor.  Patient was on the floor for about 30 minutes before him able to help him up into the house.  Reports he passed out again when he was inside the house and was on the floor for about 3 hours before he was able to get up.He lives with his wife.  Reports his wife was in the house but her phone was on vibration, so she did not hear his calls.    Clinical Impression  Patient demonstrates slow labored movement for sitting up at bedside, had difficulty with sit to stands and required use of RW for gait training due to increasing left hip pain/poor standing balance.  Patient tolerated sitting up in chair after therapy - nursing staff aware.  Patient will benefit from continued physical therapy in hospital and recommended venue below to increase strength, balance, endurance for safe ADLs and gait.  Patient BP orthostatics as follows:  Supine 129/51, sitting 133/61, standing 101/67.     Follow Up Recommendations SNF;Supervision for mobility/OOB;Supervision - Intermittent    Equipment Recommendations  None recommended by PT    Recommendations for Other Services       Precautions / Restrictions Precautions Precautions: Fall Restrictions Weight Bearing  Restrictions: No      Mobility  Bed Mobility Overal bed mobility: Needs Assistance Bed Mobility: Supine to Sit     Supine to sit: Min assist;Mod assist     General bed mobility comments: slow labored movement  Transfers Overall transfer level: Needs assistance   Transfers: Sit to/from Stand;Stand Pivot Transfers Sit to Stand: Min guard Stand pivot transfers: Min assist       General transfer comment: increased time, labored movement  Ambulation/Gait Ambulation/Gait assistance: Min assist Gait Distance (Feet): 45 Feet Assistive device: Rolling walker (2 wheeled) Gait Pattern/deviations: Decreased step length - left;Decreased stride length;Antalgic Gait velocity: decreased   General Gait Details: labored cadence with slightly limited weightbearing on LLE due to increased hip pain, had to use RW for safety  Stairs            Wheelchair Mobility    Modified Rankin (Stroke Patients Only)       Balance Overall balance assessment: Needs assistance Sitting-balance support: Feet supported;No upper extremity supported Sitting balance-Leahy Scale: Fair Sitting balance - Comments: fair/good seated at bedside   Standing balance support: During functional activity;No upper extremity supported Standing balance-Leahy Scale: Poor Standing balance comment: fair/poor without AD, fair using RW                             Pertinent Vitals/Pain Pain Assessment: 0-10 Pain Score: 4  Pain Location: left hip Pain Descriptors / Indicators:  Sore;Aching Pain Intervention(s): Limited activity within patient's tolerance;Monitored during session;Premedicated before session    [place] expects to be discharged to:: Private residence Living Arrangements: Spouse/significant other Available Help at Discharge: Available 24 hours/day;Friend(s)(Patient states his spouse is unable to assist him) Type of Home: House Home Access: Stairs to enter Entrance  Stairs-Rails: None Entrance Stairs-Number of Steps: 1 Home Layout: Two level;Laundry or work area in [place]: Environmental consultant - 2 wheels;Tub bench      Prior Function Level of Independence: Independent         Comments: Hydrographic surveyor, drives, works as a Educational psychologist Extremity Assessment Upper Extremity Assessment: Generalized weakness    Lower Extremity Assessment Lower Extremity Assessment: Generalized weakness;LLE deficits/detail LLE Deficits / Details: grossly 3+/5 due to hip pain LLE: Unable to fully assess due to pain LLE Sensation: WNL LLE Coordination: WNL    Cervical / Trunk Assessment Cervical / Trunk Assessment: Normal  Communication   Communication: No difficulties  Cognition Arousal/Alertness: Awake/alert Behavior During Therapy: WFL for tasks assessed/performed Overall Cognitive Status: Within Functional Limits for tasks assessed                                        General Comments      Exercises     Assessment/Plan    PT Assessment Patient needs continued PT services  PT Problem List Decreased strength;Decreased activity tolerance;Decreased balance;Decreased mobility       PT Treatment Interventions Gait training;Balance training;Stair training;Functional mobility training;Therapeutic exercise;Therapeutic activities;Patient/family education    PT Goals (Current goals can be found in the Care Plan section)  Acute Rehab PT Goals Patient Stated Goal: return home after rehab PT Goal Formulation: With patient Time For Goal Achievement: 12/24/19 Potential to Achieve Goals: Good    Frequency Min 3X/week   Barriers to discharge        Co-evaluation               AM-PAC PT "6 Clicks" Mobility  Outcome Measure Help needed turning from your back to your side while in a flat bed without using bedrails?: A Little Help needed moving from lying on  your back to sitting on the side of a flat bed without using bedrails?: A Lot Help needed moving to and from a bed to a chair (including a wheelchair)?: A Little Help needed standing up from a chair using your arms (e.g., wheelchair or bedside chair)?: A Little Help needed to walk in hospital room?: A Little Help needed climbing 3-5 steps with a railing? : A Lot 6 Click Score: 16    End of Session   Activity Tolerance: Patient tolerated treatment well;Patient limited by fatigue Patient left: in chair;with call bell/phone within reach Nurse Communication: Mobility status PT Visit Diagnosis: Unsteadiness on feet (R26.81);Other abnormalities of gait and mobility (R26.89);Muscle weakness (generalized) (M62.81)    Time: [id number] PT Time Calculation (min) (ACUTE ONLY): 24 min   Charges:   PT Evaluation $PT Eval Moderate Complexity: 1 Mod PT Treatments $Therapeutic Activity: 23-37 mins        2:19 PM, 12/10/19 Bryan Cabrera, MPT Physical Therapist with [facility] [phone] office [phone] mobile phone

## 2019-12-11 ENCOUNTER — Telehealth: Payer: Self-pay | Admitting: *Deleted

## 2019-12-11 NOTE — Telephone Encounter (Signed)
Transition Care Management Follow-up Telephone Call   Date discharged? 12/10/2019    How have you been since you were released from the hospital? "I had a good night slept well, when I got up to use the restroom I suddenly got nauseated."     Do you understand why you were in the hospital? yes   Do you understand the discharge instructions? yes   Where were you discharged to? Home    Items Reviewed:  Medications reviewed: yes  Allergies reviewed: yes  Dietary changes reviewed: N/A  Referrals reviewed: N/A   Functional Questionnaire:   Activities of Daily Living (ADLs):   He states they are independent in the following: bathing and hygiene, feeding, continence, grooming, toileting and dressing States they require assistance with the following: ambulation   Any transportation issues/concerns?: no   Any patient concerns? no   Confirmed importance and date/time of follow-up visits scheduled yes  Provider Appointment booked with Dr. Regis Bill 12/17/2019 at 12PM (virtually)   Confirmed with patient if condition begins to worsen call PCP or go to the ER.  Patient was given the office number and encouraged to call back with question or concerns.  : yes

## 2019-12-12 ENCOUNTER — Ambulatory Visit (HOSPITAL_COMMUNITY): Payer: Medicare PPO | Admitting: Physical Therapy

## 2019-12-12 ENCOUNTER — Telehealth (HOSPITAL_COMMUNITY): Payer: Self-pay | Admitting: Physical Therapy

## 2019-12-12 NOTE — Telephone Encounter (Signed)
pt's dtr called to cancel today's appt due to her dad is weak and not feeling good.

## 2019-12-15 ENCOUNTER — Telehealth (HOSPITAL_COMMUNITY): Payer: Self-pay | Admitting: Physical Therapy

## 2019-12-15 ENCOUNTER — Telehealth (INDEPENDENT_AMBULATORY_CARE_PROVIDER_SITE_OTHER): Payer: Medicare PPO | Admitting: Internal Medicine

## 2019-12-15 ENCOUNTER — Ambulatory Visit (HOSPITAL_COMMUNITY): Payer: Medicare PPO | Admitting: Physical Therapy

## 2019-12-15 ENCOUNTER — Encounter: Payer: Self-pay | Admitting: Internal Medicine

## 2019-12-15 ENCOUNTER — Other Ambulatory Visit: Payer: Self-pay

## 2019-12-15 DIAGNOSIS — D649 Anemia, unspecified: Secondary | ICD-10-CM

## 2019-12-15 DIAGNOSIS — Z7901 Long term (current) use of anticoagulants: Secondary | ICD-10-CM | POA: Diagnosis not present

## 2019-12-15 DIAGNOSIS — Z79899 Other long term (current) drug therapy: Secondary | ICD-10-CM

## 2019-12-15 DIAGNOSIS — I10 Essential (primary) hypertension: Secondary | ICD-10-CM

## 2019-12-15 DIAGNOSIS — S7000XA Contusion of unspecified hip, initial encounter: Secondary | ICD-10-CM | POA: Diagnosis not present

## 2019-12-15 DIAGNOSIS — Z87898 Personal history of other specified conditions: Secondary | ICD-10-CM | POA: Diagnosis not present

## 2019-12-15 DIAGNOSIS — T148XXA Other injury of unspecified body region, initial encounter: Secondary | ICD-10-CM

## 2019-12-15 DIAGNOSIS — Z9181 History of falling: Secondary | ICD-10-CM | POA: Diagnosis not present

## 2019-12-15 NOTE — Telephone Encounter (Signed)
Pt appt has been moved up

## 2019-12-15 NOTE — Telephone Encounter (Signed)
pt's dtr called in to let us know that the md wanda panosh wanted him to postpone therapy for now untill he has a orthopedic consult.
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
## 2019-12-15 NOTE — Telephone Encounter (Signed)
Left voicemail to move virtual appt up to today

## 2019-12-15 NOTE — Telephone Encounter (Signed)
Left voicemail to move appt up

## 2019-12-15 NOTE — Telephone Encounter (Signed)
Let's order ZIO patch 14 day monitor - AFIB Reviewed hospital records Sorry not feeling well.  Thanks  [name], MD

## 2019-12-15 NOTE — Telephone Encounter (Signed)
Pt has been made appt  

## 2019-12-16 ENCOUNTER — Telehealth: Payer: Self-pay | Admitting: *Deleted

## 2019-12-16 ENCOUNTER — Encounter: Payer: Self-pay | Admitting: *Deleted

## 2019-12-16 DIAGNOSIS — I4819 Other persistent atrial fibrillation: Secondary | ICD-10-CM

## 2019-12-16 NOTE — Telephone Encounter (Signed)
Let's order ZIO patch 14 day monitor - AFIB Reviewed hospital records Sorry not feeling well.  Thanks  [name], MD

## 2019-12-16 NOTE — Progress Notes (Signed)
Patient ID: Bryan Cabrera, male   DOB: Aug 26, 1941, 79 y.o.   MRN: [id number] Patient enrolled for [employer] to mail a 14 day ZIO XT long term holter monitor to the patients home.

## 2019-12-17 ENCOUNTER — Other Ambulatory Visit (INDEPENDENT_AMBULATORY_CARE_PROVIDER_SITE_OTHER): Payer: Medicare PPO

## 2019-12-17 ENCOUNTER — Telehealth: Payer: Self-pay | Admitting: Internal Medicine

## 2019-12-17 DIAGNOSIS — T148XXA Other injury of unspecified body region, initial encounter: Secondary | ICD-10-CM

## 2019-12-17 DIAGNOSIS — Z87898 Personal history of other specified conditions: Secondary | ICD-10-CM | POA: Diagnosis not present

## 2019-12-17 DIAGNOSIS — Z7901 Long term (current) use of anticoagulants: Secondary | ICD-10-CM

## 2019-12-17 DIAGNOSIS — I10 Essential (primary) hypertension: Secondary | ICD-10-CM

## 2019-12-17 DIAGNOSIS — Z9181 History of falling: Secondary | ICD-10-CM | POA: Diagnosis not present

## 2019-12-17 DIAGNOSIS — D649 Anemia, unspecified: Secondary | ICD-10-CM

## 2019-12-17 DIAGNOSIS — Z79899 Other long term (current) drug therapy: Secondary | ICD-10-CM

## 2019-12-17 LAB — BASIC METABOLIC PANEL
BUN: 18 mg/dL (ref 6–23)
CO2: 24 mEq/L (ref 19–32)
Calcium: 10 mg/dL (ref 8.4–10.5)
Chloride: 107 mEq/L (ref 96–112)
Creatinine, Ser: 1.01 mg/dL (ref 0.40–1.50)
GFR: 71.17 mL/min (ref >60.00)
Glucose, Bld: 116 mg/dL — ABNORMAL HIGH (ref 70–99)
Potassium: 4.2 mEq/L (ref 3.5–5.1)
Sodium: 139 mEq/L (ref 135–145)

## 2019-12-17 LAB — CBC WITH DIFFERENTIAL/PLATELET
Basophils Absolute: 0.1 10*3/uL (ref 0.0–0.1)
Basophils Relative: 0.8 % (ref 0.0–3.0)
Eosinophils Absolute: 0.3 10*3/uL (ref 0.0–0.7)
Eosinophils Relative: 3.1 % (ref 0.0–5.0)
HCT: 29.6 % — ABNORMAL LOW (ref 39.0–52.0)
Hemoglobin: 10.2 g/dL — ABNORMAL LOW (ref 13.0–17.0)
Lymphocytes Relative: 26.3 % (ref 12.0–46.0)
Lymphs Abs: 2.2 10*3/uL (ref 0.7–4.0)
MCHC: 34.5 g/dL (ref 30.0–36.0)
MCV: 85.5 fl (ref 78.0–100.0)
Monocytes Absolute: 0.9 10*3/uL (ref 0.1–1.0)
Monocytes Relative: 10.1 % (ref 3.0–12.0)
Neutro Abs: 5.1 10*3/uL (ref 1.4–7.7)
Neutrophils Relative %: 59.7 % (ref 43.0–77.0)
Platelets: 439 10*3/uL — ABNORMAL HIGH (ref 150.0–400.0)
RBC: 3.46 Mil/uL — ABNORMAL LOW (ref 4.22–5.81)
RDW: 13.2 % (ref 11.5–15.5)
WBC: 8.5 10*3/uL (ref 4.0–10.5)

## 2019-12-17 NOTE — Telephone Encounter (Signed)
So you are still anemic  But hemoglobin  hasn't dropped significantly from   Discharge from hospital  So stabilized . That level could make you feel weak. Please take iron pill otc  Of you choice  As possible 2 x per day  As tolerated .   Send me another message on Friday  Before the weekend about how doing   Can take bp readings  since seems stable  Now   If pain not getting worse   By weekend we may have you restart the  eliquis then

## 2019-12-17 NOTE — Progress Notes (Signed)
So you are still anemic  But hemoglobin  hasn't dropped significantly from   Discharge from hospital  So stabilized . That level could make you feel weak. Please take iron pill otc  Of your choice  As possible 2 x per day  As tolerated .   Send me another message on Friday  Before the weekend about how doing   Can take bp readings  since seems stable  Now   If pain not getting worse   By weekend we may have you restart the  eliquis then

## 2019-12-19 NOTE — Telephone Encounter (Signed)
I haven t  seen the note yet  But surmising that  He did not think  here was any active bleeding   . Thus able to transition to  Heat ( meant for  Helping  resolution of the   Coagulated hematoma)   As far as  Anticoagulation   If  Bleeding  Is stopped    I suggest may restart  Next week Mon or tues unless  New  Findings  Or problems .But certainly would welcome  cardiology input .    I would opine to  Cardiology about the long term risk benefit  of  anticoagulation  Since silent a fib is still a risk factor for stroke . And yes  Advise cardiology follow up about the  LOC.   I think is  Ok to start back the hctz if  bp remains  140 and above  Most readings   Plan fu   bp readings   Virtual visit in about 2 weeks or as needed

## 2019-12-20 ENCOUNTER — Ambulatory Visit (INDEPENDENT_AMBULATORY_CARE_PROVIDER_SITE_OTHER): Payer: Medicare PPO

## 2019-12-20 DIAGNOSIS — I4819 Other persistent atrial fibrillation: Secondary | ICD-10-CM | POA: Diagnosis not present

## 2019-12-21 NOTE — Telephone Encounter (Signed)
Let's make sure he has a appointment to discuss Eliquis.   [name], MD

## 2019-12-22 ENCOUNTER — Telehealth: Payer: Self-pay | Admitting: *Deleted

## 2019-12-22 NOTE — Telephone Encounter (Signed)
[name], MD 18 hours ago (6:57 PM)     Let's make sure he has a appointment to discuss Eliquis.   [name], MD

## 2019-12-24 NOTE — Telephone Encounter (Signed)
Patient is returning call from Dr. [name] or his nurse.

## 2019-12-24 NOTE — Telephone Encounter (Signed)
I spoke to the patient and scheduled him with Dr [name] on 3/17 to discuss Eliquis per Dr [name].  He verbalized understanding.

## 2019-12-30 NOTE — Telephone Encounter (Signed)
Pt has been scheduled.  °
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
## 2019-12-31 ENCOUNTER — Encounter: Payer: Self-pay | Admitting: Cardiology

## 2019-12-31 ENCOUNTER — Other Ambulatory Visit: Payer: Self-pay

## 2019-12-31 ENCOUNTER — Ambulatory Visit (INDEPENDENT_AMBULATORY_CARE_PROVIDER_SITE_OTHER): Payer: Medicare PPO | Admitting: Internal Medicine

## 2019-12-31 ENCOUNTER — Ambulatory Visit: Payer: Medicare PPO | Admitting: Cardiology

## 2019-12-31 ENCOUNTER — Encounter: Payer: Self-pay | Admitting: Internal Medicine

## 2019-12-31 VITALS — BP 150/80 | HR 81 | Temp 97.6°F | Ht 68.0 in | Wt 214.0 lb

## 2019-12-31 VITALS — BP 126/60 | HR 82 | Ht 68.0 in | Wt 214.0 lb

## 2019-12-31 DIAGNOSIS — I10 Essential (primary) hypertension: Secondary | ICD-10-CM

## 2019-12-31 DIAGNOSIS — D649 Anemia, unspecified: Secondary | ICD-10-CM | POA: Diagnosis not present

## 2019-12-31 DIAGNOSIS — T148XXA Other injury of unspecified body region, initial encounter: Secondary | ICD-10-CM | POA: Diagnosis not present

## 2019-12-31 DIAGNOSIS — M109 Gout, unspecified: Secondary | ICD-10-CM

## 2019-12-31 DIAGNOSIS — Z79899 Other long term (current) drug therapy: Secondary | ICD-10-CM | POA: Diagnosis not present

## 2019-12-31 DIAGNOSIS — S7002XD Contusion of left hip, subsequent encounter: Secondary | ICD-10-CM

## 2019-12-31 DIAGNOSIS — I480 Paroxysmal atrial fibrillation: Secondary | ICD-10-CM

## 2019-12-31 LAB — CBC WITH DIFFERENTIAL/PLATELET
Basophils Absolute: 0 10*3/uL (ref 0.0–0.1)
Basophils Relative: 0.4 % (ref 0.0–3.0)
Eosinophils Absolute: 0.1 10*3/uL (ref 0.0–0.7)
Eosinophils Relative: 1.7 % (ref 0.0–5.0)
HCT: 37.8 % — ABNORMAL LOW (ref 39.0–52.0)
Hemoglobin: 12.7 g/dL — ABNORMAL LOW (ref 13.0–17.0)
Lymphocytes Relative: 22.9 % (ref 12.0–46.0)
Lymphs Abs: 1.8 10*3/uL (ref 0.7–4.0)
MCHC: 33.5 g/dL (ref 30.0–36.0)
MCV: 86.3 fl (ref 78.0–100.0)
Monocytes Absolute: 0.9 10*3/uL (ref 0.1–1.0)
Monocytes Relative: 11 % (ref 3.0–12.0)
Neutro Abs: 5.1 10*3/uL (ref 1.4–7.7)
Neutrophils Relative %: 64 % (ref 43.0–77.0)
Platelets: 350 10*3/uL (ref 150.0–400.0)
RBC: 4.38 Mil/uL (ref 4.22–5.81)
RDW: 14.9 % (ref 11.5–15.5)
WBC: 8 10*3/uL (ref 4.0–10.5)

## 2019-12-31 LAB — URIC ACID: Uric Acid, Serum: 7.9 mg/dL — ABNORMAL HIGH (ref 4.0–7.8)

## 2019-12-31 MED ORDER — PREDNISONE 20 MG PO TABS: 20.0000 mg | ORAL_TABLET | Freq: Every day | ORAL | 0 refills | Status: DC

## 2019-12-31 NOTE — Patient Instructions (Signed)
Lab to check anemia today.   Try prednisone  This time  And if not better can try colchicine  After calmed down we should considier using uric aand next step  ucid level reducer such and  allopurinol.   PLan to send me a message  In 1-2 weeks  About foot   Disc anticoagulation with Cardiology .
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
## 2019-12-31 NOTE — Patient Instructions (Signed)
Medication Instructions:  Your physician recommends that you continue on your current medications as directed. Please refer to the Current Medication list given to you today.  *If you need a refill on your cardiac medications before your next appointment, please call your pharmacy*   Follow-Up: At [facility], you and your health needs are our priority.  As part of our continuing mission to provide you with exceptional heart care, we have created designated Provider Care Teams.  These Care Teams include your primary Cardiologist (physician) and Advanced Practice Providers (APPs -  Physician Assistants and Nurse Practitioners) who all work together to provide you with the care you need, when you need it.   Your next appointment:   3 week(s)  The format for your next appointment:   In Person  Provider:   [name], MD

## 2020-01-01 NOTE — Progress Notes (Signed)
Anemia is improving upt to 12.7 uric acid still high . After pain is subsiding on foot we can add allopurinol 300 mg 1 po qd  to decrease  uric acid level and prevent future attacks .  IF pain not subsiding   with the prednisone  we can try colchicine 0.6 mg bid and then qd until better . I will be out of office next week .  Will decide on adding meds  when you touch base with [place] about how doing.

## 2020-01-02 MED ORDER — ALLOPURINOL 300 MG PO TABS: 300.0000 mg | ORAL_TABLET | Freq: Every day | ORAL | 3 refills | Status: DC

## 2020-01-02 NOTE — Telephone Encounter (Signed)
FYI

## 2020-01-02 NOTE — Telephone Encounter (Signed)
Bryan Cabrera med is working . I will send in allopurinol  300 mg per day  That you can begin  When pain is  Better   ( or wait unitl we talk again)     check with me   In 2 weeks as we discussed

## 2020-01-20 MED ORDER — COLCHICINE 0.6 MG PO TABS: 0.6000 mg | ORAL_TABLET | Freq: Every day | ORAL | 1 refills | Status: DC

## 2020-01-20 NOTE — Telephone Encounter (Signed)
So we can try colchicine  Instead of prednisone  Taking one a day    y And  Then can  begin the allopurinol  After a few days

## 2020-01-23 ENCOUNTER — Ambulatory Visit: Payer: Medicare PPO | Admitting: Cardiology

## 2020-01-23 ENCOUNTER — Other Ambulatory Visit: Payer: Self-pay

## 2020-01-23 ENCOUNTER — Encounter: Payer: Self-pay | Admitting: Cardiology

## 2020-01-23 VITALS — BP 152/70 | HR 65 | Ht 68.0 in | Wt 212.0 lb

## 2020-01-23 DIAGNOSIS — I480 Paroxysmal atrial fibrillation: Secondary | ICD-10-CM | POA: Diagnosis not present

## 2020-01-23 DIAGNOSIS — G4733 Obstructive sleep apnea (adult) (pediatric): Secondary | ICD-10-CM | POA: Diagnosis not present

## 2020-01-23 DIAGNOSIS — S7002XD Contusion of left hip, subsequent encounter: Secondary | ICD-10-CM | POA: Diagnosis not present

## 2020-01-23 NOTE — Patient Instructions (Signed)
Medication Instructions:  The current medical regimen is effective;  continue present plan and medications.  *If you need a refill on your cardiac medications before your next appointment, please call your pharmacy*  Follow-Up: At [facility], you and your health needs are our priority.  As part of our continuing mission to provide you with exceptional heart care, we have created designated Provider Care Teams.  These Care Teams include your primary Cardiologist (physician) and Advanced Practice Providers (APPs -  Physician Assistants and Nurse Practitioners) who all work together to provide you with the care you need, when you need it.  We recommend signing up for the patient portal called "MyChart".  Sign up information is provided on this After Visit Summary.  MyChart is used to connect with patients for Virtual Visits (Telemedicine).  Patients are able to view lab/test results, encounter notes, upcoming appointments, etc.  Non-urgent messages can be sent to your provider as well.   To learn more about what you can do with MyChart, go to [email or web address].    Your next appointment:   4 week(s)  The format for your next appointment:   In Person  Provider:   Mark Skains, MD   Thank you for choosing [facility]!!     
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
## 2020-02-27 ENCOUNTER — Ambulatory Visit: Payer: Medicare PPO | Admitting: Cardiology

## 2020-03-12 ENCOUNTER — Other Ambulatory Visit: Payer: Self-pay | Admitting: Internal Medicine

## 2020-03-17 ENCOUNTER — Ambulatory Visit: Payer: Medicare PPO | Admitting: Cardiology

## 2020-03-17 ENCOUNTER — Encounter: Payer: Self-pay | Admitting: Cardiology

## 2020-03-17 ENCOUNTER — Other Ambulatory Visit: Payer: Self-pay

## 2020-03-17 VITALS — BP 140/60 | HR 65 | Ht 68.0 in | Wt 213.0 lb

## 2020-03-17 DIAGNOSIS — R42 Dizziness and giddiness: Secondary | ICD-10-CM

## 2020-03-17 DIAGNOSIS — I10 Essential (primary) hypertension: Secondary | ICD-10-CM | POA: Diagnosis not present

## 2020-03-17 DIAGNOSIS — S7002XD Contusion of left hip, subsequent encounter: Secondary | ICD-10-CM

## 2020-03-17 DIAGNOSIS — I480 Paroxysmal atrial fibrillation: Secondary | ICD-10-CM | POA: Diagnosis not present
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
## 2020-03-17 NOTE — Patient Instructions (Signed)
Medication Instructions:  The current medical regimen is effective;  continue present plan and medications.  *If you need a refill on your cardiac medications before your next appointment, please call your pharmacy*  You have been referred to Dr [name] for further evaluation of vertigo.  Follow-Up: At [facility], you and your health needs are our priority.  As part of our continuing mission to provide you with exceptional heart care, we have created designated Provider Care Teams.  These Care Teams include your primary Cardiologist (physician) and Advanced Practice Providers (APPs -  Physician Assistants and Nurse Practitioners) who all work together to provide you with the care you need, when you need it.  We recommend signing up for the patient portal called "MyChart".  Sign up information is provided on this After Visit Summary.  MyChart is used to connect with patients for Virtual Visits (Telemedicine).  Patients are able to view lab/test results, encounter notes, upcoming appointments, etc.  Non-urgent messages can be sent to your provider as well.   To learn more about what you can do with MyChart, go to [email or web address].    Your next appointment:   3 month(s)  The format for your next appointment:   In Person  Provider:   [name], MD   Thank you for choosing [facility]!!

## 2020-04-05 ENCOUNTER — Other Ambulatory Visit: Payer: Self-pay | Admitting: Internal Medicine

## 2020-04-06 ENCOUNTER — Other Ambulatory Visit: Payer: Self-pay | Admitting: Internal Medicine

## 2020-05-04 ENCOUNTER — Other Ambulatory Visit: Payer: Self-pay

## 2020-05-04 ENCOUNTER — Ambulatory Visit: Payer: Medicare PPO | Admitting: Internal Medicine

## 2020-05-04 ENCOUNTER — Encounter: Payer: Self-pay | Admitting: Internal Medicine

## 2020-05-04 VITALS — BP 140/72 | HR 79 | Temp 98.2°F | Ht 68.0 in | Wt 212.0 lb

## 2020-05-04 DIAGNOSIS — R21 Rash and other nonspecific skin eruption: Secondary | ICD-10-CM | POA: Diagnosis not present

## 2020-05-04 DIAGNOSIS — M79671 Pain in right foot: Secondary | ICD-10-CM

## 2020-05-04 DIAGNOSIS — K130 Diseases of lips: Secondary | ICD-10-CM | POA: Diagnosis not present

## 2020-05-04 MED ORDER — TRIAMCINOLONE ACETONIDE 0.1 % EX OINT: 1.0000 "application " | TOPICAL_OINTMENT | Freq: Two times a day (BID) | CUTANEOUS | 0 refills | Status: DC
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
## 2020-05-04 NOTE — Patient Instructions (Addendum)
Go ahead and treat   With ketoconazole   Once or twice a day    Incase   fungus related and then we can add a stronger    Cortisone.   Also  Would need to treat for 10 - 14 days before saying doesn't work .   The face area  keep dry and add  otc  monistat     Twice a day .    Good foot support and will order x ray at the [facility] office x ray in  case foot flares up  Suspect a soft tissue  Strain   From uneven ground.

## 2020-06-14 ENCOUNTER — Other Ambulatory Visit: Payer: Self-pay | Admitting: Internal Medicine

## 2020-06-22 ENCOUNTER — Other Ambulatory Visit: Payer: Self-pay

## 2020-06-22 ENCOUNTER — Ambulatory Visit: Payer: Medicare PPO | Admitting: Cardiology

## 2020-06-22 VITALS — BP 136/72 | HR 60 | Ht 69.6 in | Wt 213.4 lb

## 2020-06-22 DIAGNOSIS — I480 Paroxysmal atrial fibrillation: Secondary | ICD-10-CM
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
## 2020-06-22 NOTE — Patient Instructions (Signed)

## 2020-08-07 ENCOUNTER — Other Ambulatory Visit: Payer: Self-pay | Admitting: Internal Medicine

## 2020-08-26 ENCOUNTER — Telehealth: Payer: Self-pay | Admitting: Internal Medicine

## 2020-08-26 NOTE — Telephone Encounter (Signed)
Left message for patient to call back and schedule Medicare Annual Wellness Visit (AWV) either virtually or in office.  Last AWV6/29/2018  Please schedule at any time with [facility] with the Nurse Health Advisor 1.  45 minute appointment

## 2020-09-25 ENCOUNTER — Other Ambulatory Visit: Payer: Self-pay | Admitting: Internal Medicine

## 2020-10-15 ENCOUNTER — Other Ambulatory Visit: Payer: Self-pay | Admitting: Internal Medicine

## 2020-11-24 ENCOUNTER — Telehealth: Payer: Self-pay | Admitting: Internal Medicine

## 2020-11-24 NOTE — Telephone Encounter (Signed)
Left message for patient to call back and schedule Medicare Annual Wellness Visit (AWV) either virtually or in office. Did not leave detailed message   Last AWV 04/13/17  please schedule at anytime with [facility] Nurse Health Advisor 1 or 2   This should be a 45 minute visit.

## 2020-12-28 ENCOUNTER — Encounter: Payer: Self-pay | Admitting: Cardiology

## 2020-12-28 ENCOUNTER — Other Ambulatory Visit: Payer: Self-pay

## 2020-12-28 ENCOUNTER — Ambulatory Visit: Payer: Medicare PPO | Admitting: Cardiology

## 2020-12-28 VITALS — BP 140/70 | HR 71 | Ht 69.6 in | Wt 221.0 lb

## 2020-12-28 DIAGNOSIS — I10 Essential (primary) hypertension: Secondary | ICD-10-CM

## 2020-12-28 DIAGNOSIS — I480 Paroxysmal atrial fibrillation: Secondary | ICD-10-CM | POA: Diagnosis not present
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
## 2020-12-28 NOTE — Patient Instructions (Signed)
Medication Instructions:  The current medical regimen is effective;  continue present plan and medications.  *If you need a refill on your cardiac medications before your next appointment, please call your pharmacy*  You have been referred to Electrophysiology (Dr Quentin Ore) to be discuss Watchman device.  Follow-Up: At [facility], you and your health needs are our priority.  As part of our continuing mission to provide you with exceptional heart care, we have created designated Provider Care Teams.  These Care Teams include your primary Cardiologist (physician) and Advanced Practice Providers (APPs -  Physician Assistants and Nurse Practitioners) who all work together to provide you with the care you need, when you need it.  We recommend signing up for the patient portal called "MyChart".  Sign up information is provided on this After Visit Summary.  MyChart is used to connect with patients for Virtual Visits (Telemedicine).  Patients are able to view lab/test results, encounter notes, upcoming appointments, etc.  Non-urgent messages can be sent to your provider as well.   To learn more about what you can do with MyChart, go to [email or web address].    Your next appointment:   6 month(s)  The format for your next appointment:   In Person  Provider:   [name], MD   Thank you for choosing [facility]!!

## 2021-01-03 ENCOUNTER — Other Ambulatory Visit: Payer: Self-pay | Admitting: Internal Medicine

## 2021-01-03 NOTE — Telephone Encounter (Signed)
No the original prescribing provider for HCTZ.  Last office visit- 05/04/2020     Can the patient receive a refill?

## 2021-01-04 NOTE — Telephone Encounter (Signed)
He is due for yearly blood work monitoring last blood work was March 2021  Okay to refill both medicines x1 only  Need to arrangefastin glab  for CMP CBC hemoglobin A1c lipid panel uric acid  And then office visit or CPX to review results

## 2021-01-05 ENCOUNTER — Other Ambulatory Visit: Payer: Self-pay

## 2021-01-05 DIAGNOSIS — E781 Pure hyperglyceridemia: Secondary | ICD-10-CM

## 2021-01-05 DIAGNOSIS — R7301 Impaired fasting glucose: Secondary | ICD-10-CM

## 2021-01-05 DIAGNOSIS — D649 Anemia, unspecified: Secondary | ICD-10-CM

## 2021-01-17 ENCOUNTER — Other Ambulatory Visit: Payer: Self-pay | Admitting: Internal Medicine

## 2021-01-18 ENCOUNTER — Other Ambulatory Visit: Payer: Self-pay

## 2021-01-18 ENCOUNTER — Other Ambulatory Visit: Payer: Medicare PPO

## 2021-01-18 DIAGNOSIS — R7301 Impaired fasting glucose: Secondary | ICD-10-CM

## 2021-01-18 DIAGNOSIS — D649 Anemia, unspecified: Secondary | ICD-10-CM

## 2021-01-18 LAB — COMPREHENSIVE METABOLIC PANEL
ALT: 17 U/L (ref 0–53)
AST: 15 U/L (ref 0–37)
Albumin: 4.3 g/dL (ref 3.5–5.2)
Alkaline Phosphatase: 46 U/L (ref 39–117)
BUN: 25 mg/dL — ABNORMAL HIGH (ref 6–23)
CO2: 28 mEq/L (ref 19–32)
Calcium: 10.3 mg/dL (ref 8.4–10.5)
Chloride: 105 mEq/L (ref 96–112)
Creatinine, Ser: 1.17 mg/dL (ref 0.40–1.50)
GFR: 58.84 mL/min — ABNORMAL LOW (ref >60.00)
Glucose, Bld: 110 mg/dL — ABNORMAL HIGH (ref 70–99)
Potassium: 4.2 mEq/L (ref 3.5–5.1)
Sodium: 141 mEq/L (ref 135–145)
Total Bilirubin: 0.6 mg/dL (ref 0.2–1.2)
Total Protein: 6.7 g/dL (ref 6.0–8.3)

## 2021-01-18 LAB — CBC
HCT: 43.5 % (ref 39.0–52.0)
Hemoglobin: 15.2 g/dL (ref 13.0–17.0)
MCHC: 34.8 g/dL (ref 30.0–36.0)
MCV: 83.5 fl (ref 78.0–100.0)
Platelets: 232 10*3/uL (ref 150.0–400.0)
RBC: 5.21 Mil/uL (ref 4.22–5.81)
RDW: 13.4 % (ref 11.5–15.5)
WBC: 7.2 10*3/uL (ref 4.0–10.5)

## 2021-01-18 LAB — LIPID PANEL
Cholesterol: 169 mg/dL (ref 0–200)
HDL: 35.5 mg/dL — ABNORMAL LOW (ref >39.00)
NonHDL: 133.51
Total CHOL/HDL Ratio: 5
Triglycerides: 261 mg/dL — ABNORMAL HIGH (ref 0.0–149.0)
VLDL: 52.2 mg/dL — ABNORMAL HIGH (ref 0.0–40.0)

## 2021-01-18 LAB — URIC ACID: Uric Acid, Serum: 10.5 mg/dL — ABNORMAL HIGH (ref 4.0–7.8)

## 2021-01-18 LAB — HEMOGLOBIN A1C: Hgb A1c MFr Bld: 6 % (ref 4.6–6.5)

## 2021-01-18 LAB — LDL CHOLESTEROL, DIRECT: Direct LDL: 99 mg/dL

## 2021-01-19 NOTE — Progress Notes (Signed)
Urinc acid elevated more than last year . Will review results  at coming visit  next week

## 2021-01-25 ENCOUNTER — Encounter: Payer: Self-pay | Admitting: Internal Medicine

## 2021-01-25 ENCOUNTER — Ambulatory Visit (INDEPENDENT_AMBULATORY_CARE_PROVIDER_SITE_OTHER): Payer: Medicare PPO | Admitting: Internal Medicine

## 2021-01-25 ENCOUNTER — Other Ambulatory Visit: Payer: Self-pay

## 2021-01-25 VITALS — BP 130/66 | HR 87 | Temp 98.1°F | Ht 68.6 in | Wt 218.6 lb

## 2021-01-25 DIAGNOSIS — I480 Paroxysmal atrial fibrillation: Secondary | ICD-10-CM

## 2021-01-25 DIAGNOSIS — Z8739 Personal history of other diseases of the musculoskeletal system and connective tissue: Secondary | ICD-10-CM | POA: Diagnosis not present

## 2021-01-25 DIAGNOSIS — R7301 Impaired fasting glucose: Secondary | ICD-10-CM | POA: Diagnosis not present

## 2021-01-25 DIAGNOSIS — Z79899 Other long term (current) drug therapy: Secondary | ICD-10-CM

## 2021-01-25 DIAGNOSIS — I10 Essential (primary) hypertension: Secondary | ICD-10-CM | POA: Diagnosis not present

## 2021-01-25 DIAGNOSIS — E785 Hyperlipidemia, unspecified: Secondary | ICD-10-CM | POA: Diagnosis not present

## 2021-01-25 DIAGNOSIS — R635 Abnormal weight gain: Secondary | ICD-10-CM | POA: Diagnosis not present

## 2021-01-25 DIAGNOSIS — Z0000 Encounter for general adult medical examination without abnormal findings: Secondary | ICD-10-CM

## 2021-01-25 DIAGNOSIS — Z2821 Immunization not carried out because of patient refusal: Secondary | ICD-10-CM
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
## 2021-01-25 NOTE — Patient Instructions (Addendum)
Consider   Encourage   pneumococcal.  Vaccine  prevnar 20   Or  Pneumovax  23 and prevnar  13 .    Bp control  Ok   Advise work weight loss  5- 10 # that shoul help   Metabolic and  Energy level.  Avoiding processed carbohydrates.  Sugars  Added.   We can check   a1c test in about 6  Months      Health Maintenance, Male Adopting a healthy lifestyle and getting preventive care are important in promoting health and wellness. Ask your health care provider about:  The right schedule for you to have regular tests and exams.  Things you can do on your own to prevent diseases and keep yourself healthy. What should I know about diet, weight, and exercise? Eat a healthy diet  Eat a diet that includes plenty of vegetables, fruits, low-fat dairy products, and lean protein.  Do not eat a lot of foods that are high in solid fats, added sugars, or sodium.   Maintain a healthy weight Body mass index (BMI) is a measurement that can be used to identify possible weight problems. It estimates body fat based on height and weight. Your health care provider can help determine your BMI and help you achieve or maintain a healthy weight. Get regular exercise Get regular exercise. This is one of the most important things you can do for your health. Most adults should:  Exercise for at least 150 minutes each week. The exercise should increase your heart rate and make you sweat (moderate-intensity exercise).  Do strengthening exercises at least twice a week. This is in addition to the moderate-intensity exercise.  Spend less time sitting. Even light physical activity can be beneficial. Watch cholesterol and blood lipids Have your blood tested for lipids and cholesterol at 80 years of age, then have this test every 5 years. You may need to have your cholesterol levels checked more often if:  Your lipid or cholesterol levels are high.  You are older than 81 years of age.  You are at high risk for heart  disease. What should I know about cancer screening? Many types of cancers can be detected early and may often be prevented. Depending on your health history and family history, you may need to have cancer screening at various ages. This may include screening for:  Colorectal cancer.  Prostate cancer.  Skin cancer.  Lung cancer. What should I know about heart disease, diabetes, and high blood pressure? Blood pressure and heart disease  High blood pressure causes heart disease and increases the risk of stroke. This is more likely to develop in people who have high blood pressure readings, are of African descent, or are overweight.  Talk with your health care provider about your target blood pressure readings.  Have your blood pressure checked: ? Every 3-5 years if you are 57-81 years of age. ? Every year if you are 70 years old or older.  If you are between the ages of 6 and 30 and are a current or former smoker, ask your health care provider if you should have a one-time screening for abdominal aortic aneurysm (AAA). Diabetes Have regular diabetes screenings. This checks your fasting blood sugar level. Have the screening done:  Once every three years after age 38 if you are at a normal weight and have a low risk for diabetes.  More often and at a younger age if you are overweight or have a high risk  for diabetes. What should I know about preventing infection? Hepatitis B If you have a higher risk for hepatitis B, you should be screened for this virus. Talk with your health care provider to find out if you are at risk for hepatitis B infection. Hepatitis C Blood testing is recommended for:  Everyone born from [birth year] through 1966.  Anyone with known risk factors for hepatitis C. Sexually transmitted infections (STIs)  You should be screened each year for STIs, including gonorrhea and chlamydia, if: ? You are sexually active and are younger than 81 years of age. ? You are older  than 81 years of age and your health care provider tells you that you are at risk for this type of infection. ? Your sexual activity has changed since you were last screened, and you are at increased risk for chlamydia or gonorrhea. Ask your health care provider if you are at risk.  Ask your health care provider about whether you are at high risk for HIV. Your health care provider may recommend a prescription medicine to help prevent HIV infection. If you choose to take medicine to prevent HIV, you should first get tested for HIV. You should then be tested every 3 months for as long as you are taking the medicine. Follow these instructions at home: Lifestyle  Do not use any products that contain nicotine or tobacco, such as cigarettes, e-cigarettes, and chewing tobacco. If you need help quitting, ask your health care provider.  Do not use street drugs.  Do not share needles.  Ask your health care provider for help if you need support or information about quitting drugs. Alcohol use  Do not drink alcohol if your health care provider tells you not to drink.  If you drink alcohol: ? Limit how much you have to 0-2 drinks a day. ? Be aware of how much alcohol is in your drink. In the [place] one drink equals one 12 oz bottle of beer (355 mL), one 5 oz glass of wine (148 mL), or one 1 oz glass of hard liquor (44 mL). General instructions  Schedule regular health, dental, and eye exams.  Stay current with your vaccines.  Tell your health care provider if: ? You often feel depressed. ? You have ever been abused or do not feel safe at home. Summary  Adopting a healthy lifestyle and getting preventive care are important in promoting health and wellness.  Follow your health care provider's instructions about healthy diet, exercising, and getting tested or screened for diseases.  Follow your health care provider's instructions on monitoring your cholesterol and blood pressure. This  information is not intended to replace advice given to you by your health care provider. Make sure you discuss any questions you have with your health care provider. Document Revised: 09/25/2018 Document Reviewed: 09/25/2018 Elsevier Patient Education  2021 [employer].

## 2021-03-07 ENCOUNTER — Other Ambulatory Visit: Payer: Self-pay | Admitting: Internal Medicine

## 2021-03-16 ENCOUNTER — Other Ambulatory Visit: Payer: Self-pay | Admitting: Internal Medicine

## 2021-03-16 ENCOUNTER — Telehealth: Payer: Self-pay | Admitting: Internal Medicine

## 2021-03-16 NOTE — Telephone Encounter (Signed)
Left message for patient to call back and schedule Medicare Annual Wellness Visit (AWV) either virtually or in office.   Last AWV 04/13/17 please schedule at anytime with [facility] Nurse Health Advisor 1 or 2   This should be a 45 minute visit.

## 2021-03-30 DIAGNOSIS — H401121 Primary open-angle glaucoma, left eye, mild stage: Secondary | ICD-10-CM | POA: Diagnosis not present

## 2021-04-11 ENCOUNTER — Other Ambulatory Visit: Payer: Self-pay | Admitting: Internal Medicine

## 2021-04-14 ENCOUNTER — Other Ambulatory Visit: Payer: Self-pay

## 2021-04-15 ENCOUNTER — Ambulatory Visit: Payer: Medicare PPO | Admitting: Family Medicine

## 2021-04-15 ENCOUNTER — Encounter: Payer: Self-pay | Admitting: Family Medicine

## 2021-04-15 VITALS — BP 132/72 | HR 76 | Temp 98.0°F | Wt 215.9 lb

## 2021-04-15 DIAGNOSIS — R109 Unspecified abdominal pain: Secondary | ICD-10-CM

## 2021-04-15 DIAGNOSIS — R10A2 Flank pain, left side: Secondary | ICD-10-CM

## 2021-04-15 LAB — POCT URINALYSIS DIPSTICK
Bilirubin, UA: NEGATIVE
Blood, UA: NEGATIVE
Glucose, UA: NEGATIVE
Ketones, UA: NEGATIVE
Leukocytes, UA: NEGATIVE
Nitrite, UA: NEGATIVE
Protein, UA: POSITIVE — AB
Spec Grav, UA: 1.02 (ref 1.010–1.025)
Urobilinogen, UA: 0.2 E.U./dL
pH, UA: 5.5 (ref 5.0–8.0)
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
## 2021-04-15 NOTE — Patient Instructions (Signed)
Urine dipstick did not show any blood  Watch for now and follow up for any persistent or worsening pain.

## 2021-04-19 ENCOUNTER — Other Ambulatory Visit: Payer: Self-pay | Admitting: Internal Medicine

## 2021-04-28 ENCOUNTER — Telehealth: Payer: Self-pay | Admitting: Internal Medicine

## 2021-04-28 NOTE — Telephone Encounter (Signed)
Left message for patient to call back and schedule Medicare Annual Wellness Visit (AWV) either virtually or in office.   Last AWV 04/13/17 ; please schedule at anytime with [facility] Nurse Health Advisor 1 or 2   This should be a 45 minute visit.

## 2021-04-29 NOTE — Telephone Encounter (Signed)
Pt has called the office back and is not interested in doing this kind of an appointment

## 2021-05-03 NOTE — Telephone Encounter (Signed)
Documented on spreadsheet 

## 2021-06-06 ENCOUNTER — Other Ambulatory Visit: Payer: Self-pay | Admitting: Internal Medicine

## 2021-06-22 DIAGNOSIS — Z85828 Personal history of other malignant neoplasm of skin: Secondary | ICD-10-CM | POA: Diagnosis not present

## 2021-06-22 DIAGNOSIS — L570 Actinic keratosis: Secondary | ICD-10-CM | POA: Diagnosis not present

## 2021-06-22 DIAGNOSIS — L304 Erythema intertrigo: Secondary | ICD-10-CM | POA: Diagnosis not present

## 2021-06-29 ENCOUNTER — Telehealth: Payer: Self-pay | Admitting: Internal Medicine

## 2021-06-29 ENCOUNTER — Ambulatory Visit: Payer: Medicare PPO | Admitting: Cardiology

## 2021-06-29 NOTE — Telephone Encounter (Signed)
Left message for patient to call back and schedule Medicare Annual Wellness Visit (AWV) either virtually or in office. Left  my Herbie Drape number [phone]   Last AWV 04/13/17  please schedule at anytime with [facility] Nurse Health Advisor 1 or 2   This should be a 45 minute visit.

## 2021-06-30 NOTE — Telephone Encounter (Signed)
Patient returned my call Patient declined appointment can call in 2023

## 2021-07-10 ENCOUNTER — Telehealth: Payer: Self-pay

## 2021-07-11 ENCOUNTER — Other Ambulatory Visit: Payer: Self-pay

## 2021-07-11 NOTE — Telephone Encounter (Signed)
Patient previously contacted 06/29/2021, declined AWV. Sw, cma
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
## 2021-07-12 ENCOUNTER — Ambulatory Visit: Payer: Medicare PPO | Admitting: Internal Medicine

## 2021-07-12 ENCOUNTER — Encounter: Payer: Self-pay | Admitting: Internal Medicine

## 2021-07-12 VITALS — BP 146/70 | HR 98 | Temp 98.0°F | Ht 68.5 in | Wt 215.6 lb

## 2021-07-12 DIAGNOSIS — I10 Essential (primary) hypertension: Secondary | ICD-10-CM

## 2021-07-12 DIAGNOSIS — Z9181 History of falling: Secondary | ICD-10-CM | POA: Diagnosis not present

## 2021-07-12 DIAGNOSIS — R2689 Other abnormalities of gait and mobility: Secondary | ICD-10-CM

## 2021-07-12 DIAGNOSIS — R7301 Impaired fasting glucose: Secondary | ICD-10-CM

## 2021-07-12 DIAGNOSIS — I480 Paroxysmal atrial fibrillation: Secondary | ICD-10-CM | POA: Diagnosis not present

## 2021-07-12 DIAGNOSIS — Z79899 Other long term (current) drug therapy: Secondary | ICD-10-CM | POA: Diagnosis not present

## 2021-07-12 NOTE — Patient Instructions (Addendum)
Lab today .  Plan neurology referral consult    to get help about the balance  issue.  ? If post concussion and or meds or other.   Will share with Dr [name] information.   Plan fu depending  after  neurology  consult . Consider updated head scan mri

## 2021-07-13 ENCOUNTER — Encounter: Payer: Self-pay | Admitting: Neurology

## 2021-07-13 LAB — VITAMIN B12: Vitamin B-12: 383 pg/mL (ref 211–911)

## 2021-07-13 LAB — TSH: TSH: 3.61 u[IU]/mL (ref 0.35–5.50)

## 2021-07-13 LAB — CBC WITH DIFFERENTIAL/PLATELET
Basophils Absolute: 0 10*3/uL (ref 0.0–0.1)
Basophils Relative: 0.5 % (ref 0.0–3.0)
Eosinophils Absolute: 0.3 10*3/uL (ref 0.0–0.7)
Eosinophils Relative: 3.7 % (ref 0.0–5.0)
HCT: 44.6 % (ref 39.0–52.0)
Hemoglobin: 15.4 g/dL (ref 13.0–17.0)
Lymphocytes Relative: 30.5 % (ref 12.0–46.0)
Lymphs Abs: 2.3 10*3/uL (ref 0.7–4.0)
MCHC: 34.5 g/dL (ref 30.0–36.0)
MCV: 84.1 fl (ref 78.0–100.0)
Monocytes Absolute: 0.7 10*3/uL (ref 0.1–1.0)
Monocytes Relative: 9.8 % (ref 3.0–12.0)
Neutro Abs: 4.2 10*3/uL (ref 1.4–7.7)
Neutrophils Relative %: 55.5 % (ref 43.0–77.0)
Platelets: 238 10*3/uL (ref 150.0–400.0)
RBC: 5.3 Mil/uL (ref 4.22–5.81)
RDW: 13.6 % (ref 11.5–15.5)
WBC: 7.5 10*3/uL (ref 4.0–10.5)

## 2021-07-13 LAB — BASIC METABOLIC PANEL
BUN: 16 mg/dL (ref 6–23)
CO2: 26 mEq/L (ref 19–32)
Calcium: 10.1 mg/dL (ref 8.4–10.5)
Chloride: 104 mEq/L (ref 96–112)
Creatinine, Ser: 1.07 mg/dL (ref 0.40–1.50)
GFR: 65.28 mL/min (ref >60.00)
Glucose, Bld: 116 mg/dL — ABNORMAL HIGH (ref 70–99)
Potassium: 3.6 mEq/L (ref 3.5–5.1)
Sodium: 140 mEq/L (ref 135–145)

## 2021-07-13 LAB — T4, FREE: Free T4: 0.78 ng/dL (ref 0.60–1.60)

## 2021-07-13 LAB — HEMOGLOBIN A1C: Hgb A1c MFr Bld: 6.2 % (ref 4.6–6.5)

## 2021-07-13 NOTE — Progress Notes (Signed)
B12 low norrmal range no anemia  , thyroid   renal function normal . Blood sugar in  prediabetic range but no diabetes .  No explanation for the balance problems . Will await  neurology  consult/opinion.

## 2021-07-14 ENCOUNTER — Other Ambulatory Visit: Payer: Self-pay | Admitting: Internal Medicine

## 2021-07-26 ENCOUNTER — Other Ambulatory Visit: Payer: Self-pay | Admitting: Internal Medicine

## 2021-07-27 ENCOUNTER — Ambulatory Visit: Payer: Medicare PPO | Admitting: Internal Medicine

## 2021-08-18 ENCOUNTER — Encounter: Payer: Self-pay | Admitting: Cardiology

## 2021-08-18 ENCOUNTER — Ambulatory Visit: Payer: Medicare PPO | Admitting: Cardiology

## 2021-08-18 ENCOUNTER — Other Ambulatory Visit: Payer: Self-pay

## 2021-08-18 ENCOUNTER — Telehealth: Payer: Self-pay

## 2021-08-18 DIAGNOSIS — I10 Essential (primary) hypertension: Secondary | ICD-10-CM | POA: Diagnosis not present

## 2021-08-18 DIAGNOSIS — I480 Paroxysmal atrial fibrillation: Secondary | ICD-10-CM

## 2021-08-18 DIAGNOSIS — E7800 Pure hypercholesterolemia, unspecified: Secondary | ICD-10-CM

## 2021-08-18 DIAGNOSIS — R2681 Unsteadiness on feet: Secondary | ICD-10-CM

## 2021-08-18 NOTE — Patient Instructions (Addendum)
Medication Instructions:  The current medical regimen is effective;  continue present plan and medications.  *If you need a refill on your cardiac medications before your next appointment, please call your pharmacy*  You have been referred to Dr Lars Mage to discuss Watchman device.  Follow-Up: At [facility], you and your health needs are our priority.  As part of our continuing mission to provide you with exceptional heart care, we have created designated Provider Care Teams.  These Care Teams include your primary Cardiologist (physician) and Advanced Practice Providers (APPs -  Physician Assistants and Nurse Practitioners) who all work together to provide you with the care you need, when you need it.  We recommend signing up for the patient portal called "MyChart".  Sign up information is provided on this After Visit Summary.  MyChart is used to connect with patients for Virtual Visits (Telemedicine).  Patients are able to view lab/test results, encounter notes, upcoming appointments, etc.  Non-urgent messages can be sent to your provider as well.   To learn more about what you can do with MyChart, go to [email or web address].    Your next appointment:   6 month(s)  The format for your next appointment:   In Person  Provider:   [name], MD   Thank you for choosing [facility]!!
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
## 2021-08-18 NOTE — Assessment & Plan Note (Addendum)
Currently in atrial fibrillation heart rate 73 bpm on ECG.  He states that he is not feeling this.  Previously event monitor only showed 4 hours of atrial fibrillation over 2-week period.  In the past we have had extensive conversations about anticoagulation.  He has had many falls.  Gait instability.  In fact he had a left hip hematoma that was present for quite some time.  Thankfully it is barely noticeable at this time.  He is not on anticoagulation currently.  I previously had requested that he sit down and talk with Dr. Quentin Ore about watchman device.  I do not see that this was followed through.  He is at increased risk for stroke.  Continue with metoprolol 1-1/2 tablets or 37.5 mg twice a day.  We will go ahead and once again refer him to Dr. Quentin Ore.  He seems amenable to conversation.  Excellent.  I also wonder given his return of atrial fibrillation if he would benefit from ablative therapy as well.

## 2021-08-18 NOTE — Assessment & Plan Note (Signed)
Currently well controlled.  On hydrochlorothiazide 25 mg a day and metoprolol 37.5 mg twice a day as well as Benicar 40 mg daily.

## 2021-08-18 NOTE — Assessment & Plan Note (Addendum)
Prior falls.  Gait instability.  Referral had been made to neurology.  He has an appointment in early January.  He has been careful.

## 2021-08-18 NOTE — Telephone Encounter (Signed)
Per Dr. [name], attempted to call patient to arrange Watchman consult.  Left message to call back.

## 2021-08-22 NOTE — Telephone Encounter (Signed)
Scheduled patient for Watchman consult with Dr. Quentin Ore 09/21/2021. He was grateful for call and agrees with plan.

## 2021-08-26 ENCOUNTER — Other Ambulatory Visit: Payer: Self-pay | Admitting: Internal Medicine

## 2021-09-20 ENCOUNTER — Emergency Department (HOSPITAL_COMMUNITY): Payer: Medicare PPO

## 2021-09-20 ENCOUNTER — Observation Stay (HOSPITAL_COMMUNITY)
Admission: EM | Admit: 2021-09-20 | Discharge: 2021-09-21 | Disposition: A | Discharge status: Home / Self Care | Payer: Medicare PPO | Attending: Family Medicine | Admitting: Family Medicine

## 2021-09-20 ENCOUNTER — Other Ambulatory Visit: Payer: Self-pay

## 2021-09-20 ENCOUNTER — Encounter (HOSPITAL_COMMUNITY): Payer: Self-pay | Admitting: *Deleted

## 2021-09-20 DIAGNOSIS — R739 Hyperglycemia, unspecified: Secondary | ICD-10-CM | POA: Diagnosis not present

## 2021-09-20 DIAGNOSIS — B342 Coronavirus infection, unspecified: Secondary | ICD-10-CM

## 2021-09-20 DIAGNOSIS — Z79899 Other long term (current) drug therapy: Secondary | ICD-10-CM | POA: Insufficient documentation

## 2021-09-20 DIAGNOSIS — R112 Nausea with vomiting, unspecified: Secondary | ICD-10-CM | POA: Diagnosis not present

## 2021-09-20 DIAGNOSIS — R42 Dizziness and giddiness: Secondary | ICD-10-CM

## 2021-09-20 DIAGNOSIS — I480 Paroxysmal atrial fibrillation: Secondary | ICD-10-CM | POA: Diagnosis not present

## 2021-09-20 DIAGNOSIS — Z87891 Personal history of nicotine dependence: Secondary | ICD-10-CM | POA: Diagnosis not present

## 2021-09-20 DIAGNOSIS — I10 Essential (primary) hypertension: Secondary | ICD-10-CM

## 2021-09-20 DIAGNOSIS — U071 COVID-19: Secondary | ICD-10-CM | POA: Diagnosis not present

## 2021-09-20 DIAGNOSIS — R1111 Vomiting without nausea: Secondary | ICD-10-CM | POA: Diagnosis not present

## 2021-09-20 DIAGNOSIS — R404 Transient alteration of awareness: Secondary | ICD-10-CM | POA: Diagnosis not present

## 2021-09-20 DIAGNOSIS — R531 Weakness: Secondary | ICD-10-CM | POA: Diagnosis not present

## 2021-09-20 DIAGNOSIS — H4010X0 Unspecified open-angle glaucoma, stage unspecified: Secondary | ICD-10-CM

## 2021-09-20 DIAGNOSIS — E876 Hypokalemia: Secondary | ICD-10-CM | POA: Diagnosis not present

## 2021-09-20 DIAGNOSIS — I4891 Unspecified atrial fibrillation: Secondary | ICD-10-CM | POA: Diagnosis not present

## 2021-09-20 DIAGNOSIS — R059 Cough, unspecified: Secondary | ICD-10-CM | POA: Diagnosis not present

## 2021-09-20 DIAGNOSIS — R1110 Vomiting, unspecified: Secondary | ICD-10-CM | POA: Diagnosis not present

## 2021-09-20 HISTORY — DX: Coronavirus infection, unspecified: B34.2

## 2021-09-20 LAB — CBC WITH DIFFERENTIAL/PLATELET
Abs Immature Granulocytes: 0.04 10*3/uL (ref 0.00–0.07)
Basophils Absolute: 0.1 10*3/uL (ref 0.0–0.1)
Basophils Relative: 1 %
Eosinophils Absolute: 0 10*3/uL (ref 0.0–0.5)
Eosinophils Relative: 0 %
HCT: 45.3 % (ref 39.0–52.0)
Hemoglobin: 15.7 g/dL (ref 13.0–17.0)
Immature Granulocytes: 0 %
Lymphocytes Relative: 11 %
Lymphs Abs: 1.1 10*3/uL (ref 0.7–4.0)
MCH: 29.8 pg (ref 26.0–34.0)
MCHC: 34.7 g/dL (ref 30.0–36.0)
MCV: 86 fL (ref 80.0–100.0)
Monocytes Absolute: 1.1 10*3/uL — ABNORMAL HIGH (ref 0.1–1.0)
Monocytes Relative: 10 %
Neutro Abs: 8 10*3/uL — ABNORMAL HIGH (ref 1.7–7.7)
Neutrophils Relative %: 78 %
Platelets: 213 10*3/uL (ref 150–400)
RBC: 5.27 MIL/uL (ref 4.22–5.81)
RDW: 13.2 % (ref 11.5–15.5)
WBC: 10.4 10*3/uL (ref 4.0–10.5)
nRBC: 0 % (ref 0.0–0.2)

## 2021-09-20 LAB — COMPREHENSIVE METABOLIC PANEL
ALT: 21 U/L (ref 0–44)
AST: 22 U/L (ref 15–41)
Albumin: 4.1 g/dL (ref 3.5–5.0)
Alkaline Phosphatase: 49 U/L (ref 38–126)
Anion gap: 12 (ref 5–15)
BUN: 16 mg/dL (ref 8–23)
CO2: 22 mmol/L (ref 22–32)
Calcium: 9.6 mg/dL (ref 8.9–10.3)
Chloride: 100 mmol/L (ref 98–111)
Creatinine, Ser: 1.2 mg/dL (ref 0.61–1.24)
GFR, Estimated: >60 mL/min (ref >60)
Glucose, Bld: 213 mg/dL — ABNORMAL HIGH (ref 70–99)
Potassium: 3.3 mmol/L — ABNORMAL LOW (ref 3.5–5.1)
Sodium: 134 mmol/L — ABNORMAL LOW (ref 135–145)
Total Bilirubin: 1.2 mg/dL (ref 0.3–1.2)
Total Protein: 7 g/dL (ref 6.5–8.1)

## 2021-09-20 LAB — RESP PANEL BY RT-PCR (FLU A&B, COVID) ARPGX2
Influenza A by PCR: NEGATIVE
Influenza B by PCR: NEGATIVE
SARS Coronavirus 2 by RT PCR: POSITIVE — AB

## 2021-09-20 MED ORDER — POTASSIUM CHLORIDE CRYS ER 20 MEQ PO TBCR
20.0000 meq | EXTENDED_RELEASE_TABLET | Freq: Once | ORAL | Status: AC
  Administered 2021-09-20: 20 meq via ORAL
  Filled 2021-09-20: qty 1

## 2021-09-20 MED ORDER — LATANOPROST 0.005 % OP SOLN
1.0000 [drp] | Freq: Every day | OPHTHALMIC | Status: DC
  Administered 2021-09-20: 1 [drp] via OPHTHALMIC
  Filled 2021-09-20: qty 2.5

## 2021-09-20 MED ORDER — SODIUM CHLORIDE 0.9 % IV SOLN: 200.0000 mg | Freq: Once | INTRAVENOUS | Status: DC

## 2021-09-20 MED ORDER — SODIUM CHLORIDE 0.9 % IV SOLN
100.0000 mg | Freq: Every day | INTRAVENOUS | Status: DC
  Administered 2021-09-21: 100 mg via INTRAVENOUS

## 2021-09-20 MED ORDER — ONDANSETRON HCL 4 MG/2ML IJ SOLN: 4.0000 mg | Freq: Four times a day (QID) | INTRAMUSCULAR | Status: DC | PRN

## 2021-09-20 MED ORDER — ALBUTEROL SULFATE HFA 108 (90 BASE) MCG/ACT IN AERS
2.0000 | INHALATION_SPRAY | Freq: Four times a day (QID) | RESPIRATORY_TRACT | Status: DC
  Administered 2021-09-21: 2 via RESPIRATORY_TRACT
  Filled 2021-09-20: qty 6.7

## 2021-09-20 MED ORDER — SODIUM CHLORIDE 0.9 % IV SOLN: INTRAVENOUS | Status: DC

## 2021-09-20 MED ORDER — SODIUM CHLORIDE 0.9 % IV SOLN
100.0000 mg | Freq: Once | INTRAVENOUS | Status: AC
  Administered 2021-09-20: 100 mg via INTRAVENOUS

## 2021-09-20 MED ORDER — ZINC SULFATE 220 (50 ZN) MG PO CAPS
220.0000 mg | ORAL_CAPSULE | Freq: Every day | ORAL | Status: DC
  Administered 2021-09-21: 220 mg via ORAL
  Filled 2021-09-20: qty 1

## 2021-09-20 MED ORDER — METOPROLOL TARTRATE 25 MG PO TABS
37.5000 mg | ORAL_TABLET | Freq: Two times a day (BID) | ORAL | Status: DC
  Administered 2021-09-20 – 2021-09-21 (×2): 37.5 mg via ORAL
  Filled 2021-09-20 (×3): qty 2

## 2021-09-20 MED ORDER — ENOXAPARIN SODIUM 40 MG/0.4ML IJ SOSY
40.0000 mg | PREFILLED_SYRINGE | INTRAMUSCULAR | Status: DC
  Administered 2021-09-20: 40 mg via SUBCUTANEOUS
  Filled 2021-09-20: qty 0.4

## 2021-09-20 MED ORDER — ASCORBIC ACID 500 MG PO TABS
500.0000 mg | ORAL_TABLET | Freq: Every day | ORAL | Status: DC
  Administered 2021-09-21: 500 mg via ORAL
  Filled 2021-09-20: qty 1

## 2021-09-20 MED ORDER — PANTOPRAZOLE SODIUM 40 MG PO TBEC
40.0000 mg | DELAYED_RELEASE_TABLET | Freq: Every day | ORAL | Status: DC
  Administered 2021-09-21: 40 mg via ORAL
  Filled 2021-09-20: qty 1

## 2021-09-20 MED ORDER — HYDROCOD POLST-CPM POLST ER 10-8 MG/5ML PO SUER: 5.0000 mL | Freq: Two times a day (BID) | ORAL | Status: DC | PRN

## 2021-09-20 MED ORDER — SODIUM CHLORIDE 0.9 % IV SOLN: 100.0000 mg | Freq: Every day | INTRAVENOUS | Status: DC

## 2021-09-20 MED ORDER — IRBESARTAN 150 MG PO TABS
300.0000 mg | ORAL_TABLET | Freq: Every day | ORAL | Status: DC
  Administered 2021-09-21: 300 mg via ORAL
  Filled 2021-09-20: qty 2

## 2021-09-20 MED ORDER — ONDANSETRON HCL 4 MG PO TABS: 4.0000 mg | ORAL_TABLET | Freq: Four times a day (QID) | ORAL | Status: DC | PRN

## 2021-09-20 MED ORDER — GUAIFENESIN-DM 100-10 MG/5ML PO SYRP: 10.0000 mL | ORAL_SOLUTION | ORAL | Status: DC | PRN

## 2021-09-20 MED ORDER — ONDANSETRON 8 MG PO TBDP
8.0000 mg | ORAL_TABLET | Freq: Once | ORAL | Status: AC
  Administered 2021-09-20: 8 mg via ORAL
  Filled 2021-09-20: qty 1

## 2021-09-20 MED ORDER — ACETAMINOPHEN 325 MG PO TABS: 650.0000 mg | ORAL_TABLET | Freq: Four times a day (QID) | ORAL | Status: DC | PRN

## 2021-09-20 MED ORDER — SODIUM CHLORIDE 0.9 % IV SOLN
200.0000 mg | Freq: Once | INTRAVENOUS | Status: DC
  Filled 2021-09-20: qty 40
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
## 2021-09-20 NOTE — ED Notes (Signed)
Pt placed on cardiac monitor with BP to set cycle every 30 minutes. Continuous pulse oximeter applied.  

## 2021-09-20 NOTE — ED Notes (Signed)
Pt stated that he feels nauseous. EDP aware and putting in orders for zofran.
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
## 2021-09-20 NOTE — ED Notes (Signed)
O2 91% on RA. Pt placed on 2 L  96%.

## 2021-09-20 NOTE — ED Triage Notes (Addendum)
Pt began to have dizziness with N/V 1.5-2hours ago  ems reports cbg was 228, bp 166/84, 96% on RA and HR84
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
## 2021-09-21 ENCOUNTER — Institutional Professional Consult (permissible substitution): Payer: Medicare PPO | Admitting: Cardiology

## 2021-09-21 DIAGNOSIS — E876 Hypokalemia: Secondary | ICD-10-CM | POA: Diagnosis not present

## 2021-09-21 DIAGNOSIS — K644 Residual hemorrhoidal skin tags: Secondary | ICD-10-CM

## 2021-09-21 DIAGNOSIS — R531 Weakness: Secondary | ICD-10-CM | POA: Diagnosis not present

## 2021-09-21 DIAGNOSIS — R69 Illness, unspecified: Secondary | ICD-10-CM | POA: Diagnosis not present

## 2021-09-21 DIAGNOSIS — R42 Dizziness and giddiness: Secondary | ICD-10-CM | POA: Diagnosis not present

## 2021-09-21 DIAGNOSIS — R279 Unspecified lack of coordination: Secondary | ICD-10-CM | POA: Diagnosis not present

## 2021-09-21 DIAGNOSIS — R040 Epistaxis: Secondary | ICD-10-CM

## 2021-09-21 DIAGNOSIS — I10 Essential (primary) hypertension: Secondary | ICD-10-CM

## 2021-09-21 DIAGNOSIS — R5381 Other malaise: Secondary | ICD-10-CM | POA: Diagnosis not present

## 2021-09-21 DIAGNOSIS — R739 Hyperglycemia, unspecified: Secondary | ICD-10-CM | POA: Diagnosis not present

## 2021-09-21 DIAGNOSIS — U071 COVID-19: Secondary | ICD-10-CM | POA: Diagnosis not present

## 2021-09-21 DIAGNOSIS — Z743 Need for continuous supervision: Secondary | ICD-10-CM | POA: Diagnosis not present

## 2021-09-21 DIAGNOSIS — K5730 Diverticulosis of large intestine without perforation or abscess without bleeding: Secondary | ICD-10-CM

## 2021-09-21 DIAGNOSIS — R112 Nausea with vomiting, unspecified: Secondary | ICD-10-CM | POA: Diagnosis not present

## 2021-09-21 DIAGNOSIS — I480 Paroxysmal atrial fibrillation: Secondary | ICD-10-CM

## 2021-09-21 LAB — CBC WITH DIFFERENTIAL/PLATELET
Abs Immature Granulocytes: 0.03 10*3/uL (ref 0.00–0.07)
Basophils Absolute: 0 10*3/uL (ref 0.0–0.1)
Basophils Relative: 1 %
Eosinophils Absolute: 0 10*3/uL (ref 0.0–0.5)
Eosinophils Relative: 0 %
HCT: 40.3 % (ref 39.0–52.0)
Hemoglobin: 13.3 g/dL (ref 13.0–17.0)
Immature Granulocytes: 0 %
Lymphocytes Relative: 21 %
Lymphs Abs: 1.6 10*3/uL (ref 0.7–4.0)
MCH: 28.5 pg (ref 26.0–34.0)
MCHC: 33 g/dL (ref 30.0–36.0)
MCV: 86.5 fL (ref 80.0–100.0)
Monocytes Absolute: 1.2 10*3/uL — ABNORMAL HIGH (ref 0.1–1.0)
Monocytes Relative: 16 %
Neutro Abs: 4.7 10*3/uL (ref 1.7–7.7)
Neutrophils Relative %: 62 %
Platelets: 189 10*3/uL (ref 150–400)
RBC: 4.66 MIL/uL (ref 4.22–5.81)
RDW: 13.3 % (ref 11.5–15.5)
WBC: 7.6 10*3/uL (ref 4.0–10.5)
nRBC: 0 % (ref 0.0–0.2)

## 2021-09-21 LAB — COMPREHENSIVE METABOLIC PANEL
ALT: 16 U/L (ref 0–44)
AST: 17 U/L (ref 15–41)
Albumin: 3.4 g/dL — ABNORMAL LOW (ref 3.5–5.0)
Alkaline Phosphatase: 40 U/L (ref 38–126)
Anion gap: 6 (ref 5–15)
BUN: 15 mg/dL (ref 8–23)
CO2: 23 mmol/L (ref 22–32)
Calcium: 9.1 mg/dL (ref 8.9–10.3)
Chloride: 108 mmol/L (ref 98–111)
Creatinine, Ser: 1.02 mg/dL (ref 0.61–1.24)
GFR, Estimated: >60 mL/min (ref >60)
Glucose, Bld: 118 mg/dL — ABNORMAL HIGH (ref 70–99)
Potassium: 3.7 mmol/L (ref 3.5–5.1)
Sodium: 137 mmol/L (ref 135–145)
Total Bilirubin: 0.7 mg/dL (ref 0.3–1.2)
Total Protein: 6.1 g/dL — ABNORMAL LOW (ref 6.5–8.1)

## 2021-09-21 LAB — MAGNESIUM: Magnesium: 2 mg/dL (ref 1.7–2.4)

## 2021-09-21 LAB — PHOSPHORUS: Phosphorus: 2.1 mg/dL — ABNORMAL LOW (ref 2.5–4.6)

## 2021-09-21 LAB — D-DIMER, QUANTITATIVE: D-Dimer, Quant: 0.48 ug/mL-FEU (ref 0.00–0.50)

## 2021-09-21 LAB — HEMOGLOBIN A1C
Hgb A1c MFr Bld: 5.7 % — ABNORMAL HIGH (ref 4.8–5.6)
Mean Plasma Glucose: 116.89 mg/dL

## 2021-09-21 LAB — FERRITIN: Ferritin: 194 ng/mL (ref 24–336)

## 2021-09-21 LAB — C-REACTIVE PROTEIN: CRP: 6 mg/dL — ABNORMAL HIGH (ref <1.0)

## 2021-09-21 MED ORDER — METOPROLOL TARTRATE 25 MG PO TABS: 25.0000 mg | ORAL_TABLET | ORAL | Status: DC

## 2021-09-21 MED ORDER — ZINC SULFATE 220 (50 ZN) MG PO CAPS: 220.0000 mg | ORAL_CAPSULE | Freq: Every day | ORAL | 0 refills | Status: DC

## 2021-09-21 MED ORDER — ASCORBIC ACID 500 MG PO TABS: 500.0000 mg | ORAL_TABLET | Freq: Every day | ORAL | 0 refills | Status: DC

## 2021-09-21 MED ORDER — GUAIFENESIN-DM 100-10 MG/5ML PO SYRP: 10.0000 mL | ORAL_SOLUTION | ORAL | 0 refills | Status: DC | PRN

## 2021-09-21 MED ORDER — NIRMATRELVIR/RITONAVIR (PAXLOVID)TABLET: 3.0000 | ORAL_TABLET | Freq: Two times a day (BID) | ORAL | 0 refills | Status: DC

## 2021-09-21 MED ORDER — SODIUM CHLORIDE 0.9 % IV SOLN
INTRAVENOUS | Status: AC
  Filled 2021-09-21: qty 20

## 2021-09-21 MED ORDER — NIRMATRELVIR/RITONAVIR (PAXLOVID)TABLET: 3.0000 | ORAL_TABLET | Freq: Two times a day (BID) | ORAL | 0 refills | Status: AC

## 2021-09-21 MED ORDER — ALBUTEROL SULFATE HFA 108 (90 BASE) MCG/ACT IN AERS: 2.0000 | INHALATION_SPRAY | Freq: Four times a day (QID) | RESPIRATORY_TRACT | Status: DC | PRN

## 2021-09-21 NOTE — Discharge Summary (Signed)
Physician Discharge Summary  Bryan Cabrera:[id number] DOB: Feb 09, 1941 DOA: 09/20/2021  PCP: [name], MD  Admit date: 09/20/2021 Discharge date: 09/21/2021  Admitted From:  HOME  Disposition:  HOME   Recommendations for Outpatient Follow-up:  Follow up with PCP in 1-2 weeks  Home Health: PT, RN  Discharge Condition: STABLE   CODE STATUS: FULL  DIET: resume prior home diet    Brief Hospitalization Summary: Please see all hospital notes, images, labs for full details of the hospitalization. ADMISSION HPI:  Bryan Cabrera is a 80 y.o. male with medical history significant for paroxysmal atrial fibrillation, open-angle glaucoma, hypertension who presents to the emergency department due to several days of onset of sore throat which is associated with coughing.  He complained of generalized weakness and dizziness at home this morning, so he called EMS who took him to the ED for further evaluation and management.  Patient complains of nausea and vomiting, but denies fever, chest pain, shortness of breath, loss of smell/taste.   ED Course:  In the emergency department, he was intermittently tachypneic, but other vital signs were within normal range.  Work-up in the ED showed normal CBC, mild hyponatremia, hypokalemia, hyperglycemia.  Influenza A, B was negative.  SARS coronavirus 2 was positive.  Chest x-ray showed no radiographic evidence of acute cardiopulmonary process.  CT head without contrast showed no acute intracranial abnormality.  Patient was treated with IV urine no severe, potassium was replenished and Zofran was given.  Hospitalist was asked to admit patient for further evaluation and management.    HOSPITAL COURSE  Patient was admitted overnight for observation.  He was given supportive therapies and potassium replacement.  In terns of his incidental COVID-19 infection he had a  normal chest x-ray and he also had no oxygen requirements and has been satting 95% or higher on  room air.  Given these findings we have ordered for physical therapy evaluation and will discharge home with home health physical therapy and RN.  He does not meet requirements for hospital care at this time.  Patient changed to observation status and code 44 given to patient.  He will be discharged home on a 5-day course of Paxlovid.  Pt advised to follow up with PCP and other specialists as scheduled.   Discharge Diagnoses:  Principal Problem:   COVID-19 virus infection Active Problems:   Hyperglycemia   Essential hypertension   Nausea & vomiting   Hypokalemia   Open-angle glaucoma   Generalized weakness   Dizziness  Discharge Instructions:  Allergies as of 09/21/2021       Reactions   Penicillins Other (See Comments)   REACTION: has family hx ? If ever had a reaction Has patient had a PCN reaction causing immediate rash, facial/tongue/throat swelling, SOB or lightheadedness with hypotension: No Has patient had a PCN reaction causing severe rash involving mucus membranes or skin necrosis: No Has patient had a PCN reaction that required hospitalization: No Has patient had a PCN reaction occurring within the last 10 years: No If all of the above answers are "NO", then may proceed with Cephalosporin use.   Quinapril Hcl Cough   Tetracycline Hcl Nausea And Vomiting, Other (See Comments)   Vomiting with illness   Amoxicillin Other (See Comments)   REACTION: has family hx ? If ever had a reaction Has patient had a PCN reaction causing immediate rash, facial/tongue/throat swelling, SOB or lightheadedness with hypotension: No Has patient had a PCN reaction causing severe rash  involving mucus membranes or skin necrosis: No Has patient had a PCN reaction that required hospitalization: No Has patient had a PCN reaction occurring within the last 10 years: No If all of the above answers are "NO", then may proceed with Cephalosporin use.        Medication List     STOP taking these  medications    ketoconazole 2 % cream Commonly known as: NIZORAL       TAKE these medications    ascorbic acid 500 MG tablet Commonly known as: VITAMIN C Take 1 tablet (500 mg total) by mouth daily. Start taking on: September 22, 2021   guaiFENesin-dextromethorphan 100-10 MG/5ML syrup Commonly known as: ROBITUSSIN DM Take 10 mLs by mouth every 4 (four) hours as needed for cough.   hydrochlorothiazide 25 MG tablet Commonly known as: HYDRODIURIL TAKE ONE TABLET (25MG  TOTAL) BY MOUTH DAILY WITH BREAKFAST What changed: See the new instructions.   latanoprost 0.005 % ophthalmic solution Commonly known as: XALATAN Place 1 drop into both eyes at bedtime.   metoprolol tartrate 25 MG tablet Commonly known as: LOPRESSOR Take 1 tablet (25 mg total) by mouth See admin instructions. Take one and one-half tablets by mouth 2 times daily What changed: See the new instructions.   nirmatrelvir/ritonavir EUA 20 x 150 MG & 10 x 100MG  Tabs Commonly known as: PAXLOVID Take 3 tablets by mouth 2 (two) times daily for 5 days. Patient GFR is 64.   olmesartan 40 MG tablet Commonly known as: BENICAR TAKE ONE TABLET (40MG  TOTAL) BY MOUTH DAILY   zinc sulfate 220 (50 Zn) MG capsule Take 1 capsule (220 mg total) by mouth daily. Start taking on: September 22, 2021        Follow-up Information     Panosh, Standley Brooking, MD. Schedule an appointment as soon as possible for a visit in 1 week(s).   Specialties: Internal Medicine, Pediatrics Why: Hospital Follow Up Contact information: [place] [address] [phone]         [name], MD .   Specialty: Cardiology Contact information: [phone] [address] [place] [address] [phone]         [name], MD .   Specialties: Cardiology, Radiology Contact information: [address] [place] [address] [phone]                Allergies  Allergen Reactions    Penicillins Other (See Comments)    REACTION: has family hx ? If ever had a reaction Has patient had a PCN reaction causing immediate rash, facial/tongue/throat swelling, SOB or lightheadedness with hypotension: No Has patient had a PCN reaction causing severe rash involving mucus membranes or skin necrosis: No Has patient had a PCN reaction that required hospitalization: No Has patient had a PCN reaction occurring within the last 10 years: No If all of the above answers are "NO", then may proceed with Cephalosporin use.     Quinapril Hcl Cough   Tetracycline Hcl Nausea And Vomiting and Other (See Comments)    Vomiting with illness   Amoxicillin Other (See Comments)    REACTION: has family hx ? If ever had a reaction Has patient had a PCN reaction causing immediate rash, facial/tongue/throat swelling, SOB or lightheadedness with hypotension: No Has patient had a PCN reaction causing severe rash involving mucus membranes or skin necrosis: No Has patient had a PCN reaction that required hospitalization: No Has patient had a  PCN reaction occurring within the last 10 years: No If all of the above answers are "NO", then may proceed with Cephalosporin use.    Allergies as of 09/21/2021       Reactions   Penicillins Other (See Comments)   REACTION: has family hx ? If ever had a reaction Has patient had a PCN reaction causing immediate rash, facial/tongue/throat swelling, SOB or lightheadedness with hypotension: No Has patient had a PCN reaction causing severe rash involving mucus membranes or skin necrosis: No Has patient had a PCN reaction that required hospitalization: No Has patient had a PCN reaction occurring within the last 10 years: No If all of the above answers are "NO", then may proceed with Cephalosporin use.   Quinapril Hcl Cough   Tetracycline Hcl Nausea And Vomiting, Other (See Comments)   Vomiting with illness   Amoxicillin Other (See Comments)   REACTION: has family hx ? If  ever had a reaction Has patient had a PCN reaction causing immediate rash, facial/tongue/throat swelling, SOB or lightheadedness with hypotension: No Has patient had a PCN reaction causing severe rash involving mucus membranes or skin necrosis: No Has patient had a PCN reaction that required hospitalization: No Has patient had a PCN reaction occurring within the last 10 years: No If all of the above answers are "NO", then may proceed with Cephalosporin use.        Medication List     STOP taking these medications    ketoconazole 2 % cream Commonly known as: NIZORAL       TAKE these medications    ascorbic acid 500 MG tablet Commonly known as: VITAMIN C Take 1 tablet (500 mg total) by mouth daily. Start taking on: September 22, 2021   guaiFENesin-dextromethorphan 100-10 MG/5ML syrup Commonly known as: ROBITUSSIN DM Take 10 mLs by mouth every 4 (four) hours as needed for cough.   hydrochlorothiazide 25 MG tablet Commonly known as: HYDRODIURIL TAKE ONE TABLET (25MG  TOTAL) BY MOUTH DAILY WITH BREAKFAST What changed: See the new instructions.   latanoprost 0.005 % ophthalmic solution Commonly known as: XALATAN Place 1 drop into both eyes at bedtime.   metoprolol tartrate 25 MG tablet Commonly known as: LOPRESSOR Take 1 tablet (25 mg total) by mouth See admin instructions. Take one and one-half tablets by mouth 2 times daily What changed: See the new instructions.   nirmatrelvir/ritonavir EUA 20 x 150 MG & 10 x 100MG  Tabs Commonly known as: PAXLOVID Take 3 tablets by mouth 2 (two) times daily for 5 days. Patient GFR is 64.   olmesartan 40 MG tablet Commonly known as: BENICAR TAKE ONE TABLET (40MG  TOTAL) BY MOUTH DAILY   zinc sulfate 220 (50 Zn) MG capsule Take 1 capsule (220 mg total) by mouth daily. Start taking on: September 22, 2021        Procedures/Studies: CT Head Wo Contrast  Result Date: 09/20/2021 CLINICAL DATA:  Neuro deficit, acute, stroke suspected.  dizziness with n/v that began earlier tonight COVID+ EXAM: CT HEAD WITHOUT CONTRAST TECHNIQUE: Contiguous axial images were obtained from the base of the skull through the vertex without intravenous contrast. COMPARISON:  None. BRAIN: BRAIN Cerebral ventricle sizes are concordant with the degree of cerebral volume loss. No evidence of large-territorial acute infarction. No parenchymal hemorrhage. No mass lesion. No extra-axial collection. No mass effect or midline shift. No hydrocephalus. Basilar cisterns are patent. Empty sella. Vascular: No hyperdense vessel. Skull: No acute fracture or focal lesion. Sinuses/Orbits: Paranasal sinuses and mastoid air  cells are clear. Bilateral lens replacement. Otherwise orbits are unremarkable. Other: None. IMPRESSION: 1. No acute intracranial abnormality. 2. Empty sella. Findings is often a normal anatomic variant but can be associated with idiopathic intracranial hypertension (pseudotumor cerebri). Electronically Signed   By: Iven Finn M.D.   On: 09/20/2021 20:06   DG Chest Port 1 View  Result Date: 09/20/2021 CLINICAL DATA:  Cough, dizziness, vomiting EXAM: PORTABLE CHEST 1 VIEW COMPARISON:  Chest radiograph 12/09/2019 FINDINGS: The cardiomediastinal silhouette is stable. There is no focal consolidation or pulmonary edema. There is no pleural effusion or pneumothorax. There is no acute osseous abnormality. IMPRESSION: No radiographic evidence of acute cardiopulmonary process. Electronically Signed   By: Valetta Mole M.D.   On: 09/20/2021 16:05     Subjective: Pt says he is feeling a lot better this morning.  He rested well.  No complaints.  He remains on room air with sats 95% or higher.   Discharge Exam: Vitals:   09/21/21 0551 09/21/21 0551  BP: (!) 106/47 (!) 106/47  Pulse: 85 83  Resp: 18 18  Temp: 98.4 F (36.9 C) 98.4 F (36.9 C)  SpO2: 95% 95%   Vitals:   09/21/21 0230 09/21/21 0305 09/21/21 0551 09/21/21 0551  BP:  (!) 111/56 (!) 106/47 (!)  106/47  Pulse:  82 85 83  Resp:  18 18 18   Temp:  99 F (37.2 C) 98.4 F (36.9 C) 98.4 F (36.9 C)  TempSrc:      SpO2: 96% 91% 95% 95%  Weight:      Height:       General: Pt is alert, awake, not in acute distress Cardiovascular: normal S1/S2 +, no rubs, no gallops Respiratory: CTA bilaterally, no wheezing, no rhonchi Abdominal: Soft, NT, ND, bowel sounds + Extremities: no edema, no cyanosis   The results of significant diagnostics from this hospitalization (including imaging, microbiology, ancillary and laboratory) are listed below for reference.     Microbiology: Recent Results (from the past 240 hour(s))  Resp Panel by RT-PCR (Flu A&B, Covid) Nasopharyngeal Swab     Status: Abnormal   Collection Time: 09/20/21  4:05 PM   Specimen: Nasopharyngeal Swab; Nasopharyngeal(NP) swabs in vial transport medium  Result Value Ref Range Status   SARS Coronavirus 2 by RT PCR POSITIVE (A) NEGATIVE Final    Comment: RESULT CALLED TO, READ BACK BY AND VERIFIED WITH: ALYSSA, RN @ [phone] ON 09/20/21 C VARNER (NOTE) SARS-CoV-2 target nucleic acids are DETECTED.  The SARS-CoV-2 RNA is generally detectable in upper respiratory specimens during the acute phase of infection. Positive results are indicative of the presence of the identified virus, but do not rule out bacterial infection or co-infection with other pathogens not detected by the test. Clinical correlation with patient history and other diagnostic information is necessary to determine patient infection status. The expected result is Negative.  Fact Sheet for Patients: EntrepreneurPulse.com.au  Fact Sheet for Healthcare Providers: IncredibleEmployment.be  This test is not yet approved or cleared by the [place] FDA and  has been authorized for detection and/or diagnosis of SARS-CoV-2 by FDA under an Emergency Use Authorization (EUA).  This EUA will remain in effect (meaning this test ca n  be used) for the duration of  the COVID-19 declaration under Section 564(b)(1) of the Act, 21 U.S.C. section 360bbb-3(b)(1), unless the authorization is terminated or revoked sooner.     Influenza A by PCR NEGATIVE NEGATIVE Final   Influenza B by PCR NEGATIVE NEGATIVE Final  Comment: (NOTE) The Xpert Xpress SARS-CoV-2/FLU/RSV plus assay is intended as an aid in the diagnosis of influenza from Nasopharyngeal swab specimens and should not be used as a sole basis for treatment. Nasal washings and aspirates are unacceptable for Xpert Xpress SARS-CoV-2/FLU/RSV testing.  Fact Sheet for Patients: EntrepreneurPulse.com.au  Fact Sheet for Healthcare Providers: IncredibleEmployment.be  This test is not yet approved or cleared by the [place] FDA and has been authorized for detection and/or diagnosis of SARS-CoV-2 by FDA under an Emergency Use Authorization (EUA). This EUA will remain in effect (meaning this test can be used) for the duration of the COVID-19 declaration under Section 564(b)(1) of the Act, 21 U.S.C. section 360bbb-3(b)(1), unless the authorization is terminated or revoked.  Performed at [facility], [address] [place], [place] [address]      Labs: BNP (last 3 results) No results for input(s): BNP in the last 8760 hours. Basic Metabolic Panel: Recent Labs  Lab 09/20/21 1549 09/21/21 0539  NA 134* 137  K 3.3* 3.7  CL 100 108  CO2 22 23  GLUCOSE 213* 118*  BUN 16 15  CREATININE 1.20 1.02  CALCIUM 9.6 9.1  MG  --  2.0  PHOS  --  2.1*   Liver Function Tests: Recent Labs  Lab 09/20/21 1549 09/21/21 0539  AST 22 17  ALT 21 16  ALKPHOS 49 40  BILITOT 1.2 0.7  PROT 7.0 6.1*  ALBUMIN 4.1 3.4*   No results for input(s): LIPASE, AMYLASE in the last 168 hours. No results for input(s): AMMONIA in the last 168 hours. CBC: Recent Labs  Lab 09/20/21 1549 09/21/21 0539  WBC 10.4 7.6  NEUTROABS 8.0* 4.7  HGB  15.7 13.3  HCT 45.3 40.3  MCV 86.0 86.5  PLT 213 189   Cardiac Enzymes: No results for input(s): CKTOTAL, CKMB, CKMBINDEX, TROPONINI in the last 168 hours. BNP: Invalid input(s): POCBNP CBG: No results for input(s): GLUCAP in the last 168 hours. D-Dimer Recent Labs    09/21/21 0539  DDIMER 0.48   Hgb A1c Recent Labs    09/21/21 0539  HGBA1C 5.7*   Lipid Profile No results for input(s): CHOL, HDL, LDLCALC, TRIG, CHOLHDL, LDLDIRECT in the last 72 hours. Thyroid function studies No results for input(s): TSH, T4TOTAL, T3FREE, THYROIDAB in the last 72 hours.  Invalid input(s): FREET3 Anemia work up Recent Labs    09/21/21 0539  FERRITIN 194   Urinalysis    Component Value Date/Time   COLORURINE STRAW (A) 12/09/2015 0050   APPEARANCEUR CLEAR 12/09/2015 0050   LABSPEC 1.015 12/09/2015 0050   PHURINE 7.5 12/09/2015 0050   GLUCOSEU 100 (A) 12/09/2015 0050   HGBUR TRACE (A) 12/09/2015 0050   HGBUR negative 11/21/2007 1012   BILIRUBINUR neg 04/15/2021 1427   KETONESUR NEGATIVE 12/09/2015 0050   PROTEINUR Positive (A) 04/15/2021 1427   PROTEINUR NEGATIVE 12/09/2015 0050   UROBILINOGEN 0.2 04/15/2021 1427   UROBILINOGEN 0.2 08/15/2015 2350   NITRITE neg 04/15/2021 1427   NITRITE NEGATIVE 12/09/2015 0050   LEUKOCYTESUR Negative 04/15/2021 1427   Sepsis Labs Invalid input(s): PROCALCITONIN,  WBC,  LACTICIDVEN Microbiology Recent Results (from the past 240 hour(s))  Resp Panel by RT-PCR (Flu A&B, Covid) Nasopharyngeal Swab     Status: Abnormal   Collection Time: 09/20/21  4:05 PM   Specimen: Nasopharyngeal Swab; Nasopharyngeal(NP) swabs in vial transport medium  Result Value Ref Range Status   SARS Coronavirus 2 by RT PCR POSITIVE (A) NEGATIVE Final    Comment:  RESULT CALLED TO, READ BACK BY AND VERIFIED WITH: ALYSSA, RN @ [phone] ON 09/20/21 C VARNER (NOTE) SARS-CoV-2 target nucleic acids are DETECTED.  The SARS-CoV-2 RNA is generally detectable in upper  respiratory specimens during the acute phase of infection. Positive results are indicative of the presence of the identified virus, but do not rule out bacterial infection or co-infection with other pathogens not detected by the test. Clinical correlation with patient history and other diagnostic information is necessary to determine patient infection status. The expected result is Negative.  Fact Sheet for Patients: EntrepreneurPulse.com.au  Fact Sheet for Healthcare Providers: IncredibleEmployment.be  This test is not yet approved or cleared by the [place] FDA and  has been authorized for detection and/or diagnosis of SARS-CoV-2 by FDA under an Emergency Use Authorization (EUA).  This EUA will remain in effect (meaning this test ca n be used) for the duration of  the COVID-19 declaration under Section 564(b)(1) of the Act, 21 U.S.C. section 360bbb-3(b)(1), unless the authorization is terminated or revoked sooner.     Influenza A by PCR NEGATIVE NEGATIVE Final   Influenza B by PCR NEGATIVE NEGATIVE Final    Comment: (NOTE) The Xpert Xpress SARS-CoV-2/FLU/RSV plus assay is intended as an aid in the diagnosis of influenza from Nasopharyngeal swab specimens and should not be used as a sole basis for treatment. Nasal washings and aspirates are unacceptable for Xpert Xpress SARS-CoV-2/FLU/RSV testing.  Fact Sheet for Patients: EntrepreneurPulse.com.au  Fact Sheet for Healthcare Providers: IncredibleEmployment.be  This test is not yet approved or cleared by the [place] FDA and has been authorized for detection and/or diagnosis of SARS-CoV-2 by FDA under an Emergency Use Authorization (EUA). This EUA will remain in effect (meaning this test can be used) for the duration of the COVID-19 declaration under Section 564(b)(1) of the Act, 21 U.S.C. section 360bbb-3(b)(1), unless the authorization is  terminated or revoked.  Performed at [facility], [address] [place], [place] [address]    Time coordinating discharge:   SIGNED:  Irwin Brakeman, MD  [facility] 09/21/2021, 12:31 PM How to contact the [facility] Attending or Consulting provider [place] or covering provider during after hours [place], for this patient?  Check the care team in [facility] and look for a) attending/consulting [facility] provider listed and b) the [facility] team listed Log into [email or web address] and use [place]'s universal password to access. If you do not have the password, please contact the hospital operator. Locate the [facility] provider you are looking for under [facility] and page to a number that you can be directly reached. If you still have difficulty reaching the provider, please page the [facility] (Director on Call) for the Hospitalists listed on amion for assistance.

## 2021-09-21 NOTE — Clinical Social Work Note (Signed)
Patient from home with spouse. Admitted COVID+. HHPT and RW recommended. Patient declines HHPT/RN and states that he has 2-3 RWs in the home. Requests EMS transport home. EMS arranged.    Nafis Farnan, Clydene Pugh, LCSW

## 2021-09-21 NOTE — Care Management CC44 (Signed)
Condition Code 44 Documentation Completed  Patient Details  Name: Bryan Cabrera MRN: [id number] Date of Birth: 1941/04/05   Condition Code 44 given:  Yes Patient signature on Condition Code 44 notice:  Yes Documentation of 2 MD's agreement:  Yes Code 44 added to claim:  Yes    Ihor Gully, LCSW 09/21/2021, 12:49 PM

## 2021-09-21 NOTE — Evaluation (Signed)
Physical Therapy Evaluation Patient Details Name: Bryan Cabrera MRN: [id number] DOB: 1941-03-20 Today's Date: 09/21/2021  History of Present Illness  Bryan Cabrera is a 80 y.o. male with medical history significant for paroxysmal atrial fibrillation, open-angle glaucoma, hypertension who presents to the emergency department due to several days of onset of sore throat which is associated with coughing.  He complained of generalized weakness and dizziness at home this morning, so he called EMS who took him to the ED for further evaluation and management.  Patient complains of nausea and vomiting, but denies fever, chest pain, shortness of breath, loss of smell/taste.   Clinical Impression  Patient functioning near baseline for functional mobility and gait other than demonstrating slow slightly labored cadence during ambulation with tendency to lean on nearby objects for support, safer using RW or pushing IV pole with good return for walking in room without loss of balance.  Patient encouraged to ambulate ad lib in room for length of stay and use his pick up walker or RW (if he accepts) until he is not feeling need to lean on objects for support during ambulation.  Plan:  Patient discharged from physical therapy to care of nursing for ambulation daily as tolerated for length of stay.         Recommendations for follow up therapy are one component of a multi-disciplinary discharge planning process, led by the attending physician.  Recommendations may be updated based on patient status, additional functional criteria and insurance authorization.  Follow Up Recommendations Home health PT    Assistance Recommended at Discharge PRN  Functional Status Assessment    Equipment Recommendations  Rolling walker (2 wheels)    Recommendations for Other Services       Precautions / Restrictions Precautions Precautions: Fall Restrictions Weight Bearing Restrictions: No      Mobility  Bed  Mobility Overal bed mobility: Modified Independent                  Transfers Overall transfer level: Modified independent                 General transfer comment: demonstrates good return for transferring to/from commode in bathroom and to chair at bedside    Ambulation/Gait Ambulation/Gait assistance: Modified independent (Device/Increase time);Supervision Gait Distance (Feet): 60 Feet Assistive device: None;Rolling walker (2 wheels);IV Pole Gait Pattern/deviations: Decreased step length - right;Decreased step length - left;Decreased stride length Gait velocity: decreased     General Gait Details: slightly labored cadence with frequent leaning on nearby objects for support, safer using RW or pushing IV pole without loss of balance  Stairs            Wheelchair Mobility    Modified Rankin (Stroke Patients Only)       Balance Overall balance assessment: Needs assistance Sitting-balance support: Feet supported;No upper extremity supported Sitting balance-Leahy Scale: Good Sitting balance - Comments: seated at EOB   Standing balance support: During functional activity;Bilateral upper extremity supported Standing balance-Leahy Scale: Fair Standing balance comment: fair/good using RW                             Pertinent Vitals/Pain Pain Assessment: Faces Faces Pain Scale: Hurts little more Pain Location: left side upper back/hip Pain Descriptors / Indicators: Sore Pain Intervention(s): Limited activity within patient's tolerance;Monitored during session;Repositioned    Home Living Family/patient expects to be discharged to:: Private residence Living Arrangements: Spouse/significant other Available Help  at Discharge: Family;Available PRN/intermittently Type of Home: House Home Access: Ramped entrance     Alternate Level Stairs-Number of Steps: has ramped entrance from garage Home Layout: One level Home Equipment: Standard Walker;Tub  bench      Prior Function Prior Level of Function : Independent/Modified Independent             Mobility Comments: Hydrographic surveyor, drives, works full time as a Oceanographer ADLs Comments: Independent     Hand Dominance        Extremity/Trunk Assessment   Upper Extremity Assessment Upper Extremity Assessment: Overall WFL for tasks assessed    Lower Extremity Assessment Lower Extremity Assessment: Generalized weakness    Cervical / Trunk Assessment Cervical / Trunk Assessment: Normal  Communication   Communication: No difficulties  Cognition Arousal/Alertness: Awake/alert Behavior During Therapy: WFL for tasks assessed/performed Overall Cognitive Status: Within Functional Limits for tasks assessed                                          General Comments      Exercises     Assessment/Plan    PT Assessment All further PT needs can be met in the next venue of care  PT Problem List Decreased strength;Decreased activity tolerance;Decreased balance;Decreased mobility       PT Treatment Interventions      PT Goals (Current goals can be found in the Care Plan section)  Acute Rehab PT Goals Patient Stated Goal: return home able to take care of self PT Goal Formulation: With patient Time For Goal Achievement: 09/21/21 Potential to Achieve Goals: Good    Frequency     Barriers to discharge        Co-evaluation               AM-PAC PT "6 Clicks" Mobility  Outcome Measure Help needed turning from your back to your side while in a flat bed without using bedrails?: None Help needed moving from lying on your back to sitting on the side of a flat bed without using bedrails?: None Help needed moving to and from a bed to a chair (including a wheelchair)?: None Help needed standing up from a chair using your arms (e.g., wheelchair or bedside chair)?: None Help needed to walk in hospital room?: A Little Help needed climbing 3-5 steps with  a railing? : A Little 6 Click Score: 22    End of Session   Activity Tolerance: Patient tolerated treatment well;Patient limited by fatigue Patient left: in chair;with call bell/phone within reach Nurse Communication: Mobility status PT Visit Diagnosis: Unsteadiness on feet (R26.81);Other abnormalities of gait and mobility (R26.89);Muscle weakness (generalized) (M62.81)    Time: 7414-2395 PT Time Calculation (min) (ACUTE ONLY): 32 min   Charges:   PT Evaluation $PT Eval Moderate Complexity: 1 Mod PT Treatments $Therapeutic Activity: 23-37 mins        2:04 PM, 09/21/21 Lonell Grandchild, MPT Physical Therapist with [facility] [phone] office [phone] mobile phone

## 2021-09-21 NOTE — Discharge Instructions (Signed)
IMPORTANT INFORMATION: PAY CLOSE ATTENTION   PHYSICIAN DISCHARGE INSTRUCTIONS  Follow with Primary care provider  Panosh, Standley Brooking, MD  and other consultants as instructed by your Hospitalist Physician  [place] IF SYMPTOMS COME BACK, WORSEN OR NEW PROBLEM DEVELOPS   Please note: You were cared for by a hospitalist during your hospital stay. Every effort will be made to forward records to your primary care provider.  You can request that your primary care provider send for your hospital records if they have not received them.  Once you are discharged, your primary care physician will handle any further medical issues. Please note that NO REFILLS for any discharge medications will be authorized once you are discharged, as it is imperative that you return to your primary care physician (or establish a relationship with a primary care physician if you do not have one) for your post hospital discharge needs so that they can reassess your need for medications and monitor your lab values.  Please get a complete blood count and chemistry panel checked by your Primary MD at your next visit, and again as instructed by your Primary MD.  Get Medicines reviewed and adjusted: Please take all your medications with you for your next visit with your Primary MD  Laboratory/radiological data: Please request your Primary MD to go over all hospital tests and procedure/radiological results at the follow up, please ask your primary care provider to get all Hospital records sent to his/her office.  In some cases, they will be blood work, cultures and biopsy results pending at the time of your discharge. Please request that your primary care provider follow up on these results.  If you are diabetic, please bring your blood sugar readings with you to your follow up appointment with primary care.    Please call and make your follow up appointments as soon as possible.    Also Note  the following: If you experience worsening of your admission symptoms, develop shortness of breath, life threatening emergency, suicidal or homicidal thoughts you must seek medical attention immediately by calling 911 or calling your MD immediately  if symptoms less severe.  You must read complete instructions/literature along with all the possible adverse reactions/side effects for all the Medicines you take and that have been prescribed to you. Take any new Medicines after you have completely understood and accpet all the possible adverse reactions/side effects.   Do not drive when taking Pain medications or sleeping medications (Benzodiazepines)  Do not take more than prescribed Pain, Sleep and Anxiety Medications. It is not advisable to combine anxiety,sleep and pain medications without talking with your primary care practitioner  Special Instructions: If you have smoked or chewed Tobacco  in the last 2 yrs please stop smoking, stop any regular Alcohol  and or any Recreational drug use.  Wear Seat belts while driving.  Do not drive if taking any narcotic, mind altering or controlled substances or recreational drugs or alcohol.

## 2021-09-29 ENCOUNTER — Encounter: Payer: Self-pay | Admitting: Internal Medicine

## 2021-09-30 NOTE — Telephone Encounter (Signed)
Patient has been added to Dr. Elease Hashimoto schedule due to limited availability with PCP.

## 2021-10-03 ENCOUNTER — Ambulatory Visit: Payer: Medicare PPO | Admitting: Family Medicine

## 2021-10-04 ENCOUNTER — Ambulatory Visit: Payer: Medicare PPO | Admitting: Family Medicine

## 2021-10-04 ENCOUNTER — Ambulatory Visit (INDEPENDENT_AMBULATORY_CARE_PROVIDER_SITE_OTHER): Payer: Medicare PPO

## 2021-10-04 ENCOUNTER — Other Ambulatory Visit: Payer: Self-pay

## 2021-10-04 VITALS — BP 150/90 | HR 81 | Temp 97.6°F | Wt 210.6 lb

## 2021-10-04 DIAGNOSIS — Z23 Encounter for immunization: Secondary | ICD-10-CM | POA: Diagnosis not present

## 2021-10-04 DIAGNOSIS — R0781 Pleurodynia: Secondary | ICD-10-CM | POA: Diagnosis not present
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
## 2021-10-14 ENCOUNTER — Telehealth: Payer: Self-pay

## 2021-10-14 NOTE — Telephone Encounter (Signed)
Transition Care Management Follow-up Telephone Call Date of discharge and from where: 09/21/2021  [facility] How have you been since you were released from the hospital? Much improved Any questions or concerns? No  Items Reviewed: Did the pt receive and understand the discharge instructions provided? Yes  Medications obtained and verified? Yes  Other? No  Any new allergies since your discharge? No  Dietary orders reviewed? No Do you have support at home? Yes   Home Care and Equipment/Supplies: Were home health services ordered? refused If so, what is the name of the agency?  Has the agency set up a time to come to the patient's home? not applicable Were any new equipment or medical supplies ordered?  No What is the name of the medical supply agency?  Were you able to get the supplies/equipment? not applicable Do you have any questions related to the use of the equipment or supplies? No  Functional Questionnaire: (I = Independent and D = Dependent) ADLs: I  Bathing/Dressing- I  Meal Prep- I  Eating- I  Maintaining continence- I  Transferring/Ambulation- I  Managing Meds- I  Follow up appointments reviewed:  PCP Hospital f/u appt confirmed? Yes  Scheduled to see PCP office on 12/27  Specialist Hospital f/u appt confirmed? No   Are transportation arrangements needed?  If their condition worsens, is the pt aware to call PCP or go to the Emergency Dept.? Yes Was the patient provided with contact information for the PCP's office or ED? Yes Was to pt encouraged to call back with questions or concerns? Yes  Complaining of left rib left kidney pain. Reports he discussed with MD. Encouraged patient to call MD back if he continues to have pain.  Tomasa Rand, RN, BSN, CEN [place] [employer] [phone]
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
## 2021-10-19 ENCOUNTER — Encounter: Payer: Self-pay | Admitting: Neurology

## 2021-10-19 ENCOUNTER — Other Ambulatory Visit: Payer: Self-pay

## 2021-10-19 ENCOUNTER — Other Ambulatory Visit: Payer: Self-pay | Admitting: Internal Medicine

## 2021-10-19 ENCOUNTER — Ambulatory Visit: Payer: Medicare PPO | Admitting: Neurology

## 2021-10-19 VITALS — BP 144/82 | HR 78 | Ht 68.0 in | Wt 209.6 lb

## 2021-10-19 DIAGNOSIS — R2681 Unsteadiness on feet: Secondary | ICD-10-CM | POA: Diagnosis not present

## 2021-10-19 NOTE — Patient Instructions (Addendum)
I don't notice any primary neurologic disease as cause of balance problems.  Likely age-related

## 2021-10-22 ENCOUNTER — Other Ambulatory Visit: Payer: Self-pay | Admitting: Internal Medicine

## 2021-10-24 ENCOUNTER — Telehealth: Payer: Self-pay | Admitting: Internal Medicine

## 2021-10-24 ENCOUNTER — Other Ambulatory Visit: Payer: Self-pay

## 2021-10-24 NOTE — Telephone Encounter (Signed)
Pharmacy called to get refill on metoprolol tartrate (LOPRESSOR) 25 MG tablet and hydrochlorothiazide (HYDRODIURIL) 25 MG tablet  Pharmacy states they have no received anything back from our office    Please send to  [name], [place] Phone:  [phone]  Fax:  [phone]       Please advise

## 2021-10-25 ENCOUNTER — Ambulatory Visit: Payer: Medicare PPO | Admitting: Cardiology

## 2021-10-25 ENCOUNTER — Encounter: Payer: Self-pay | Admitting: Cardiology

## 2021-10-25 ENCOUNTER — Other Ambulatory Visit: Payer: Self-pay

## 2021-10-25 VITALS — BP 130/76 | HR 83 | Ht 68.0 in | Wt 209.8 lb

## 2021-10-25 DIAGNOSIS — S7002XD Contusion of left hip, subsequent encounter: Secondary | ICD-10-CM

## 2021-10-25 DIAGNOSIS — I10 Essential (primary) hypertension: Secondary | ICD-10-CM

## 2021-10-25 DIAGNOSIS — I4819 Other persistent atrial fibrillation: Secondary | ICD-10-CM | POA: Diagnosis not present

## 2021-10-25 DIAGNOSIS — G4733 Obstructive sleep apnea (adult) (pediatric): Secondary | ICD-10-CM | POA: Diagnosis not present
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
## 2021-10-25 NOTE — Patient Instructions (Signed)
Medication Instructions:  Your physician recommends that you continue on your current medications as directed. Please refer to the Current Medication list given to you today. *If you need a refill on your cardiac medications before your next appointment, please call your pharmacy*  Lab Work: None. If you have labs (blood work) drawn today and your tests are completely normal, you will receive your results only by: [place] (if you have MyChart) OR A paper copy in the mail If you have any lab test that is abnormal or we need to change your treatment, we will call you to review the results.  Testing/Procedures: None.  Follow-Up: At [facility], you and your health needs are our priority.  As part of our continuing mission to provide you with exceptional heart care, we have created designated Provider Care Teams.  These Care Teams include your primary Cardiologist (physician) and Advanced Practice Providers (APPs -  Physician Assistants and Nurse Practitioners) who all work together to provide you with the care you need, when you need it.  Your physician wants you to follow-up in: Please call and ask for Otila Kluver RN if you would like to proceed with the watchman device.   We recommend signing up for the patient portal called "MyChart".  Sign up information is provided on this After Visit Summary.  MyChart is used to connect with patients for Virtual Visits (Telemedicine).  Patients are able to view lab/test results, encounter notes, upcoming appointments, etc.  Non-urgent messages can be sent to your provider as well.   To learn more about what you can do with MyChart, go to [email or web address].    Any Other Special Instructions Will Be Listed Below (If Applicable).

## 2021-10-26 ENCOUNTER — Other Ambulatory Visit: Payer: Self-pay | Admitting: Internal Medicine

## 2021-10-26 NOTE — Telephone Encounter (Signed)
Pt is call back and stated he have call 3 time for metoprolo tartrate and haven't got it and asking will you please sent the refill to  [facility] [place] Phone:  [phone]  Fax:  [phone]

## 2021-10-27 MED ORDER — METOPROLOL TARTRATE 25 MG PO TABS: 25.0000 mg | ORAL_TABLET | ORAL | 1 refills | Status: DC

## 2021-10-27 NOTE — Telephone Encounter (Signed)
HCTZ was refilled on 10/26/21. Metoprolol was last refilled 09/21/21 by Dr Irwin Brakeman. Pt notified of this; states he does not know who that provider is. Will send to PCP for confirm it can be refilled.

## 2021-10-27 NOTE — Addendum Note (Signed)
Addended byBurnis Medin on: 10/27/2021 03:43 PM   Modules accepted: Orders

## 2021-10-27 NOTE — Telephone Encounter (Signed)
Pt last seen dr Regis Bill on 07-12-2021. Pt did see dr Elease Hashimoto on 10-04-2021 and pt is still waiting on bp med. Pt has been out of medication for over a week

## 2021-10-28 NOTE — Telephone Encounter (Signed)
Pt is aware & states that he picked up the medications today & has no further ques at this time.

## 2021-10-31 ENCOUNTER — Other Ambulatory Visit: Payer: Self-pay

## 2021-10-31 ENCOUNTER — Emergency Department (HOSPITAL_COMMUNITY)
Admission: EM | Admit: 2021-10-31 | Discharge: 2021-10-31 | Disposition: A | Discharge status: Home / Self Care | Payer: Medicare PPO | Attending: Emergency Medicine | Admitting: Emergency Medicine

## 2021-10-31 ENCOUNTER — Emergency Department (HOSPITAL_COMMUNITY): Payer: Medicare PPO

## 2021-10-31 ENCOUNTER — Ambulatory Visit
Admission: EM | Admit: 2021-10-31 | Discharge: 2021-10-31 | Disposition: A | Discharge status: Home / Self Care | Payer: Medicare PPO

## 2021-10-31 ENCOUNTER — Encounter (HOSPITAL_COMMUNITY): Payer: Self-pay | Admitting: *Deleted

## 2021-10-31 DIAGNOSIS — I10 Essential (primary) hypertension: Secondary | ICD-10-CM | POA: Diagnosis not present

## 2021-10-31 DIAGNOSIS — R100 Acute abdomen: Secondary | ICD-10-CM

## 2021-10-31 DIAGNOSIS — I480 Paroxysmal atrial fibrillation: Secondary | ICD-10-CM | POA: Diagnosis not present

## 2021-10-31 DIAGNOSIS — R198 Other specified symptoms and signs involving the digestive system and abdomen: Secondary | ICD-10-CM

## 2021-10-31 DIAGNOSIS — K5792 Diverticulitis of intestine, part unspecified, without perforation or abscess without bleeding: Secondary | ICD-10-CM | POA: Diagnosis not present

## 2021-10-31 DIAGNOSIS — K4090 Unilateral inguinal hernia, without obstruction or gangrene, not specified as recurrent: Secondary | ICD-10-CM | POA: Diagnosis not present

## 2021-10-31 DIAGNOSIS — K5790 Diverticulosis of intestine, part unspecified, without perforation or abscess without bleeding: Secondary | ICD-10-CM

## 2021-10-31 DIAGNOSIS — K862 Cyst of pancreas: Secondary | ICD-10-CM | POA: Diagnosis not present

## 2021-10-31 DIAGNOSIS — R1032 Left lower quadrant pain: Secondary | ICD-10-CM | POA: Diagnosis present

## 2021-10-31 DIAGNOSIS — N400 Enlarged prostate without lower urinary tract symptoms: Secondary | ICD-10-CM | POA: Diagnosis not present

## 2021-10-31 DIAGNOSIS — Z79899 Other long term (current) drug therapy: Secondary | ICD-10-CM | POA: Diagnosis not present

## 2021-10-31 DIAGNOSIS — K5732 Diverticulitis of large intestine without perforation or abscess without bleeding: Secondary | ICD-10-CM | POA: Diagnosis not present

## 2021-10-31 LAB — COMPREHENSIVE METABOLIC PANEL
ALT: 18 U/L (ref 0–44)
AST: 16 U/L (ref 15–41)
Albumin: 4.2 g/dL (ref 3.5–5.0)
Alkaline Phosphatase: 58 U/L (ref 38–126)
Anion gap: 11 (ref 5–15)
BUN: 21 mg/dL (ref 8–23)
CO2: 23 mmol/L (ref 22–32)
Calcium: 10.3 mg/dL (ref 8.9–10.3)
Chloride: 106 mmol/L (ref 98–111)
Creatinine, Ser: 1.18 mg/dL (ref 0.61–1.24)
GFR, Estimated: >60 mL/min (ref >60)
Glucose, Bld: 115 mg/dL — ABNORMAL HIGH (ref 70–99)
Potassium: 3.8 mmol/L (ref 3.5–5.1)
Sodium: 140 mmol/L (ref 135–145)
Total Bilirubin: 0.6 mg/dL (ref 0.3–1.2)
Total Protein: 7.5 g/dL (ref 6.5–8.1)

## 2021-10-31 LAB — LIPASE, BLOOD: Lipase: 21 U/L (ref 11–51)

## 2021-10-31 LAB — CBC
HCT: 49.3 % (ref 39.0–52.0)
Hemoglobin: 16.9 g/dL (ref 13.0–17.0)
MCH: 29.6 pg (ref 26.0–34.0)
MCHC: 34.3 g/dL (ref 30.0–36.0)
MCV: 86.5 fL (ref 80.0–100.0)
Platelets: 269 10*3/uL (ref 150–400)
RBC: 5.7 MIL/uL (ref 4.22–5.81)
RDW: 13.3 % (ref 11.5–15.5)
WBC: 8.5 10*3/uL (ref 4.0–10.5)
nRBC: 0 % (ref 0.0–0.2)

## 2021-10-31 MED ORDER — METRONIDAZOLE 500 MG PO TABS
500.0000 mg | ORAL_TABLET | Freq: Once | ORAL | Status: AC
  Administered 2021-10-31: 500 mg via ORAL
  Filled 2021-10-31: qty 1

## 2021-10-31 MED ORDER — CIPROFLOXACIN HCL 250 MG PO TABS
500.0000 mg | ORAL_TABLET | Freq: Once | ORAL | Status: AC
  Administered 2021-10-31: 500 mg via ORAL
  Filled 2021-10-31: qty 2

## 2021-10-31 MED ORDER — IOHEXOL 300 MG/ML  SOLN
100.0000 mL | Freq: Once | INTRAMUSCULAR | Status: AC | PRN
  Administered 2021-10-31: 100 mL via INTRAVENOUS

## 2021-10-31 MED ORDER — METRONIDAZOLE 500 MG PO TABS: 500.0000 mg | ORAL_TABLET | Freq: Three times a day (TID) | ORAL | 0 refills | Status: DC

## 2021-10-31 MED ORDER — CIPROFLOXACIN HCL 500 MG PO TABS: 500.0000 mg | ORAL_TABLET | Freq: Two times a day (BID) | ORAL | 0 refills | Status: DC

## 2021-10-31 NOTE — ED Provider Notes (Signed)
[facility] EMERGENCY DEPARTMENT Provider Note   CSN: [id number] Arrival date & time: 10/31/21  1311     History  Chief Complaint  Patient presents with   Abdominal Pain    Bryan Cabrera is a 81 y.o. male with a history significant for hypertension, sleep apnea, acid reflux disease and paroxysmal A. fib, no significant surgical history presenting for evaluation of left lower abdominal pain.  He describes a constant low-grade aching pain in his left lower abdomen which has been constant but tolerable, worsened with movement, walking in deep palpation, better at rest.  He has had no nausea, vomiting or diarrhea, denies fevers or chills.  He denies prior episodes of similar symptoms.  Review of the chart indicates that he does have a history of diverticulosis per screening colonoscopy.  He has had no treatments for symptoms prior to arrival.  He was seen at a urgent care center and referred here for further  The history is provided by the patient.      Home Medications Prior to Admission medications   Medication Sig Start Date End Date Taking? Authorizing Provider  ciprofloxacin (CIPRO) 500 MG tablet Take 1 tablet (500 mg total) by mouth every 12 (twelve) hours for 10 days. 10/31/21 11/10/21 Yes Ben Sanz, Almyra Free, [name]  metroNIDAZOLE (FLAGYL) 500 MG tablet Take 1 tablet (500 mg total) by mouth 3 (three) times daily for 10 days. 10/31/21 11/10/21 Yes Olie Scaffidi, Almyra Free, [name]  hydrochlorothiazide (HYDRODIURIL) 25 MG tablet TAKE ONE TABLET (25MG TOTAL) BY MOUTH DAILY WITH BREAKFAST 10/26/21   Panosh, Standley Brooking, MD  latanoprost (XALATAN) 0.005 % ophthalmic solution Place 1 drop into both eyes at bedtime. 08/09/18   Caren Macadam, MD  metoprolol tartrate (LOPRESSOR) 25 MG tablet Take 1 tablet (25 mg total) by mouth See admin instructions. Take one and one-half tablets by mouth 2 times daily 10/27/21   Panosh, Standley Brooking, MD  olmesartan (BENICAR) 40 MG tablet TAKE ONE TABLET (40MG TOTAL) BY MOUTH DAILY 11/02/21    Panosh, Standley Brooking, MD      Allergies    Penicillins, Quinapril hcl, Tetracycline hcl, and Amoxicillin    Review of Systems   Review of Systems  Constitutional:  Negative for chills and fever.  HENT:  Negative for congestion and sore throat.   Eyes: Negative.   Respiratory:  Negative for chest tightness and shortness of breath.   Cardiovascular:  Negative for chest pain.  Gastrointestinal:  Positive for abdominal pain. Negative for abdominal distention, diarrhea, nausea and vomiting.  Genitourinary: Negative.   Musculoskeletal:  Negative for arthralgias, joint swelling and neck pain.  Skin: Negative.  Negative for rash and wound.  Neurological:  Negative for dizziness, weakness, light-headedness, numbness and headaches.  Psychiatric/Behavioral: Negative.    All other systems reviewed and are negative.  Physical Exam Updated Vital Signs BP (!) 158/76    Pulse 78    Temp 98 F (36.7 C) (Oral)    Resp 18    Ht _0  (1.727 m)    Wt 94.8 kg    SpO2 97%    BMI 31.78 kg/m  Physical Exam Vitals and nursing note reviewed.  Constitutional:      Appearance: He is well-developed.  HENT:     Head: Normocephalic and atraumatic.  Eyes:     Conjunctiva/sclera: Conjunctivae normal.  Cardiovascular:     Rate and Rhythm: Normal rate and regular rhythm.     Heart sounds: Normal heart sounds.  Pulmonary:  Effort: Pulmonary effort is normal.     Breath sounds: Normal breath sounds. No wheezing.  Abdominal:     General: Abdomen is protuberant. Bowel sounds are normal.     Palpations: Abdomen is soft.     Tenderness: There is abdominal tenderness in the left lower quadrant. There is no guarding or rebound.     Hernia: No hernia is present.  Musculoskeletal:        General: Normal range of motion.     Cervical back: Normal range of motion.  Skin:    General: Skin is warm and dry.  Neurological:     Mental Status: He is alert.    ED Results / Procedures / Treatments   Labs (all labs  ordered are listed, but only abnormal results are displayed) Labs Reviewed  COMPREHENSIVE METABOLIC PANEL - Abnormal; Notable for the following components:      Result Value   Glucose, Bld 115 (*)    All other components within normal limits  LIPASE, BLOOD  CBC    EKG None  Radiology No results found.   CT ABDOMEN PELVIS W CONTRAST  Result Date: 10/31/2021 CLINICAL DATA:  Left lower quadrant abdominal pain.  Five days. EXAM: CT ABDOMEN AND PELVIS WITH CONTRAST TECHNIQUE: Multidetector CT imaging of the abdomen and pelvis was performed using the standard protocol following bolus administration of intravenous contrast. RADIATION DOSE REDUCTION: This exam was performed according to the departmental dose-optimization program which includes automated exposure control, adjustment of the mA and/or kV according to patient size and/or use of iterative reconstruction technique. CONTRAST:  155m OMNIPAQUE IOHEXOL 300 MG/ML  SOLN COMPARISON:  CT abdomen and pelvis 12/09/2015 FINDINGS: Lower chest: The minimally visualized base of the lungs is unremarkable. Heart size is mildly enlarged, similar to prior. Hepatobiliary: Smooth liver contours. There is diffuse decreased density seen throughout the liver suggesting mild fatty infiltration, unchanged from prior. No focal liver mass is identified. The gallbladder is unremarkable. No intrahepatic or extrahepatic biliary ductal dilatation. Pancreas: There is again high-grade atrophy and fatty infiltration of the pancreas. The previously seen fluid density cyst within the pancreatic tail appears stable to decreased in size from prior, now measuring up to 9 mm in transverse dimension and 10 mm in craniocaudal dimension compared to 11 mm in transverse dimension and 10 mm in craniocaudal dimension previously. There is also an anterior downstream pancreatic body 13 mm cyst (axial series 2, image 22) that appears grossly unchanged from 12/09/2015. Note is made that a  report only of a prior 01/11/2016 MRI described numerous scattered pancreatic cysts. No pancreatic ductal dilatation is seen. Spleen: Normal in size without focal abnormality. Adrenals/Urinary Tract: Adrenal glands are unremarkable. The kidneys enhance uniformly and are symmetric in size without hydronephrosis. There is an 8 mm fluid density partially exophytic cyst within the posterior aspect of the lower pole of the right kidney, unchanged from 12/09/2015. No renal stone is seen. No renal mass is seen. No focal urinary bladder wall thickening. Stomach/Bowel: No focal bowel wall thickening. Moderate sigmoid and descending colon diverticulosis. There is mild wall thickening and inflammatory fat stranding around the posterior wall of the distal descending colon and proximal sigmoid colon (axial image 50, coronal image 41), mild diverticulitis. No evidence of perforation. Moderate stool is seen throughout the colon. The terminal ileum is unremarkable. The appendix appears within normal limits. No dilated loops of bowel to indicate bowel obstruction. Vascular/Lymphatic: No abdominal aortic aneurysm. Mild atherosclerosis. Reproductive: The prostate measures up to  4.9 cm in transverse dimension, mildly enlarged. Seminal vesicles are unremarkable. Other: Small fat containing umbilical hernia. Moderate left fat containing inguinal hernia. No abdominopelvic ascites. No pneumoperitoneum. Musculoskeletal: Moderate degenerative disc changes of the L4-5 level including disc space narrowing, endplate sclerosis, and degenerative Schmorl's nodes. Moderate left sacroiliac joint osteoarthritis including joint space narrowing and bridging osteophytosis. IMPRESSION:: IMPRESSION: 1. Moderate distal descending colon and sigmoid diverticulosis with inflammatory changes at the junction of the distal descending colon and proximal sigmoid, uncomplicated diverticulitis. No evidence of perforation. No abscess. 2. High-grade atrophy and fatty  infiltration of the pancreas. There are again small cysts within the pancreas, as described on prior CT and MRI. 3. Mildly enlarged prostate. 4. Moderate left fat containing inguinal hernia. Electronically Signed   By: Yvonne Kendall M.D.   On: 10/31/2021 18:06    Procedures Procedures    Medications Ordered in ED Medications  iohexol (OMNIPAQUE) 300 MG/ML solution 100 mL (100 mLs Intravenous Contrast Given 10/31/21 1743)  ciprofloxacin (CIPRO) tablet 500 mg (500 mg Oral Given 10/31/21 1846)  metroNIDAZOLE (FLAGYL) tablet 500 mg (500 mg Oral Given 10/31/21 1846)    ED Course/ Medical Decision Making/ A&P                           Medical Decision Making Patient with acute left lower abdominal pain suspicious for probable acute diverticulitis.  Amount and/or Complexity of Data Reviewed External Data Reviewed: radiology and notes.    Details: Prior GI visits reviewed, colonoscopy reviewed. Labs: ordered.    Details: Today's labs are normal and reassuring, normal CBC, WBC count is normal, c-Met and lipase are all normal. Radiology: ordered.    Details: CT scan does indicate acute sigmoid diverticulitis also involving the distal descending colon.  Uncomplicated, no evidence of perforation, no abscess.  Risk Prescription drug management. Decision regarding hospitalization. Risk Details: Patient with acute diverticulitis but stable, no evidence for perforation or abscess.  He was felt stable for discharge home with close follow-up.  He was started on Cipro and Flagyl, deferred pain medication.  We discussed home treatment, fluids, a clear liquid to very bland low residue diet for the first several days followed by increasing to a regular diet as tolerated.  We also discussed strict return precautions, worsening pain, uncontrolled vomiting, development of fever.  Advise close follow-up with his PCP and/or return here for any worsening symptoms.           Final Clinical Impression(s) /  ED Diagnoses Final diagnoses:  Diverticulitis    Rx / DC Orders ED Discharge Orders          Ordered    ciprofloxacin (CIPRO) 500 MG tablet  Every 12 hours        10/31/21 1845    metroNIDAZOLE (FLAGYL) 500 MG tablet  3 times daily        10/31/21 1845              Evalee Jefferson, [name] 11/02/21 1830    Milton Ferguson, MD 11/03/21 1012

## 2021-10-31 NOTE — Discharge Instructions (Addendum)
Take the entire course of the antibiotics prescribed.  Make sure you are drinking plenty of fluids to avoid dehydration.  Maintain a clear liquid diet for the next 24 hours, however you can add low fat, low fiber foods as your symptoms improve over the next 1 to 2 days.  Increase your diet as tolerated.  As discussed you need to come back here if you develop any fevers, worse pain or vomiting.

## 2021-10-31 NOTE — ED Notes (Signed)
Pt states he was seen at urgent care this am and was told to come here for a CT scan. He does not want to have any blood work done or other testing.

## 2021-10-31 NOTE — ED Triage Notes (Signed)
Pt c/o lower left abdominal pain x 4 days ago; pt denies any n/v/d
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
## 2021-10-31 NOTE — ED Triage Notes (Signed)
Patient states that last Thursday afternoon he started having a pain in his left lower abdomin  Patient states he has had some dizziness with this pain  Patient states he took Tylenol for the pain. Last dose yesterday morning  Denies

## 2021-11-01 ENCOUNTER — Other Ambulatory Visit: Payer: Self-pay | Admitting: Internal Medicine

## 2021-11-04 ENCOUNTER — Encounter: Payer: Self-pay | Admitting: Family Medicine

## 2021-11-04 ENCOUNTER — Telehealth (INDEPENDENT_AMBULATORY_CARE_PROVIDER_SITE_OTHER): Payer: Medicare PPO | Admitting: Family Medicine

## 2021-11-04 DIAGNOSIS — K5732 Diverticulitis of large intestine without perforation or abscess without bleeding: Secondary | ICD-10-CM

## 2021-11-04 MED ORDER — ONDANSETRON HCL 8 MG PO TABS: 8.0000 mg | ORAL_TABLET | Freq: Four times a day (QID) | ORAL | 0 refills | Status: DC | PRN

## 2021-11-04 MED ORDER — CEPHALEXIN 500 MG PO CAPS: 500.0000 mg | ORAL_CAPSULE | Freq: Three times a day (TID) | ORAL | 0 refills | Status: AC

## 2021-11-04 NOTE — Progress Notes (Signed)
° °  Subjective:    Patient ID: Bryan Cabrera, male    DOB: 05-Feb-1941, 81 y.o.   MRN: [id number]  HPI Virtual Visit via Telephone Note  I connected with the patient on 11/04/21 at  1:45 PM EST by telephone and verified that I am speaking with the correct person using two identifiers.   I discussed the limitations, risks, security and privacy concerns of performing an evaluation and management service by telephone and the availability of in person appointments. I also discussed with the patient that there may be a patient responsible charge related to this service. The patient expressed understanding and agreed to proceed.  Location patient: home Location provider: work or home office Participants present for the call: patient, provider Patient did not have a visit in the prior 7 days to address this/these issue(s).   History of Present Illness: Here to follow up on an ED visit on 10-31-21 for diverticulitis. This was the first time he has ever had this. He presented with LLQ pain but no fever. His WBC was normal at 8.5. A CT scan showed diverticulitis in the descending and sigmoid colon regions. He was sent home on Cipro and Flagyl. Since then he thinks the infection has improved because the pain has totally resolved and he is passing normal BM's. However he has dealt with severe nausea and dizziness since the ED visit, and he actually fell at home this morning because he was so weak. There was no LOC. EMS was called and when they checked him he seemed to be okay. He has been drinking fluids all week but has eaten little food.    Observations/Objective: Patient sounds cheerful and well on the phone. I do not appreciate any SOB. Speech and thought processing are grossly intact. Patient reported vitals:  Assessment and Plan: He has partially treated diverticulitis, but the Cipro and Flagyl are causing prohibitive side effects. We will stop these and complete his treatment with 7 days of  Keflex 500 mg TID. He can use Zofran as needed for nausea. Follow up as needed. Alysia Penna, MD   Follow Up Instructions:     [phone] 5-10 [phone] 11-20 [id number] 21-30 I did not refer this patient for an OV in the next 24 hours for this/these issue(s).  I discussed the assessment and treatment plan with the patient. The patient was provided an opportunity to ask questions and all were answered. The patient agreed with the plan and demonstrated an understanding of the instructions.   The patient was advised to call back or seek an in-person evaluation if the symptoms worsen or if the condition fails to improve as anticipated.  I provided 28 minutes of non-face-to-face time during this encounter.   Alysia Penna, MD     Review of Systems     Objective:   Physical Exam        Assessment & Plan:

## 2021-11-07 ENCOUNTER — Ambulatory Visit: Payer: Medicare PPO | Admitting: Internal Medicine

## 2021-11-07 ENCOUNTER — Encounter: Payer: Self-pay | Admitting: Internal Medicine

## 2021-11-07 VITALS — BP 126/74 | HR 69 | Temp 98.0°F | Ht 68.0 in | Wt 204.2 lb

## 2021-11-07 DIAGNOSIS — Z8616 Personal history of COVID-19: Secondary | ICD-10-CM | POA: Diagnosis not present

## 2021-11-07 DIAGNOSIS — K5732 Diverticulitis of large intestine without perforation or abscess without bleeding: Secondary | ICD-10-CM | POA: Diagnosis not present

## 2021-11-07 DIAGNOSIS — R21 Rash and other nonspecific skin eruption: Secondary | ICD-10-CM

## 2021-11-07 DIAGNOSIS — R42 Dizziness and giddiness: Secondary | ICD-10-CM

## 2021-11-07 DIAGNOSIS — Z79899 Other long term (current) drug therapy: Secondary | ICD-10-CM

## 2021-11-07 NOTE — Patient Instructions (Addendum)
Finish antibiotic    .  I think the   dizziness is related to medication and left over covid.   Sometimes  can get orthostatic hypotension . From covid but should recover .  Caution with standing up quickly .   Try off hctz for a week ( or longer)  and  monitor   symptoms .  And BP .   We can add back if needed   Hopefully the fatigue and dizziness  will  improve.    Plan ROV in 1 month.  Or as needed
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
## 2021-11-24 ENCOUNTER — Other Ambulatory Visit: Payer: Self-pay

## 2021-11-24 ENCOUNTER — Observation Stay (HOSPITAL_COMMUNITY)
Admission: EM | Admit: 2021-11-24 | Discharge: 2021-11-25 | Disposition: A | Discharge status: Home / Self Care | Payer: Medicare PPO | Attending: Family Medicine | Admitting: Family Medicine

## 2021-11-24 ENCOUNTER — Encounter (HOSPITAL_COMMUNITY): Payer: Self-pay | Admitting: Emergency Medicine

## 2021-11-24 ENCOUNTER — Emergency Department (HOSPITAL_COMMUNITY): Payer: Medicare PPO

## 2021-11-24 DIAGNOSIS — Z87891 Personal history of nicotine dependence: Secondary | ICD-10-CM | POA: Insufficient documentation

## 2021-11-24 DIAGNOSIS — R0789 Other chest pain: Secondary | ICD-10-CM

## 2021-11-24 DIAGNOSIS — I160 Hypertensive urgency: Secondary | ICD-10-CM | POA: Diagnosis not present

## 2021-11-24 DIAGNOSIS — Z79899 Other long term (current) drug therapy: Secondary | ICD-10-CM | POA: Diagnosis not present

## 2021-11-24 DIAGNOSIS — I200 Unstable angina: Principal | ICD-10-CM | POA: Insufficient documentation

## 2021-11-24 DIAGNOSIS — I10 Essential (primary) hypertension: Secondary | ICD-10-CM | POA: Diagnosis not present

## 2021-11-24 DIAGNOSIS — I4821 Permanent atrial fibrillation: Secondary | ICD-10-CM | POA: Diagnosis present

## 2021-11-24 DIAGNOSIS — R0689 Other abnormalities of breathing: Secondary | ICD-10-CM | POA: Diagnosis not present

## 2021-11-24 DIAGNOSIS — I4891 Unspecified atrial fibrillation: Secondary | ICD-10-CM | POA: Diagnosis not present

## 2021-11-24 DIAGNOSIS — I480 Paroxysmal atrial fibrillation: Secondary | ICD-10-CM

## 2021-11-24 DIAGNOSIS — H4010X0 Unspecified open-angle glaucoma, stage unspecified: Secondary | ICD-10-CM | POA: Diagnosis present

## 2021-11-24 DIAGNOSIS — R079 Chest pain, unspecified: Secondary | ICD-10-CM | POA: Diagnosis not present

## 2021-11-24 DIAGNOSIS — M79603 Pain in arm, unspecified: Secondary | ICD-10-CM | POA: Diagnosis not present

## 2021-11-24 LAB — BASIC METABOLIC PANEL
Anion gap: 7 (ref 5–15)
BUN: 17 mg/dL (ref 8–23)
CO2: 19 mmol/L — ABNORMAL LOW (ref 22–32)
Calcium: 9.3 mg/dL (ref 8.9–10.3)
Chloride: 108 mmol/L (ref 98–111)
Creatinine, Ser: 1.02 mg/dL (ref 0.61–1.24)
GFR, Estimated: >60 mL/min (ref >60)
Glucose, Bld: 115 mg/dL — ABNORMAL HIGH (ref 70–99)
Potassium: 3.5 mmol/L (ref 3.5–5.1)
Sodium: 134 mmol/L — ABNORMAL LOW (ref 135–145)

## 2021-11-24 LAB — CBC
HCT: 44.4 % (ref 39.0–52.0)
Hemoglobin: 15.3 g/dL (ref 13.0–17.0)
MCH: 29.5 pg (ref 26.0–34.0)
MCHC: 34.5 g/dL (ref 30.0–36.0)
MCV: 85.5 fL (ref 80.0–100.0)
Platelets: 229 10*3/uL (ref 150–400)
RBC: 5.19 MIL/uL (ref 4.22–5.81)
RDW: 13.5 % (ref 11.5–15.5)
WBC: 7.9 10*3/uL (ref 4.0–10.5)
nRBC: 0 % (ref 0.0–0.2)

## 2021-11-24 LAB — TROPONIN I (HIGH SENSITIVITY)
Troponin I (High Sensitivity): 14 ng/L (ref <18)
Troponin I (High Sensitivity): 24 ng/L — ABNORMAL HIGH (ref <18)

## 2021-11-24 LAB — CBG MONITORING, ED: Glucose-Capillary: 110 mg/dL — ABNORMAL HIGH (ref 70–99)

## 2021-11-24 MED ORDER — LATANOPROST 0.005 % OP SOLN: 1.0000 [drp] | Freq: Every day | OPHTHALMIC | Status: DC

## 2021-11-24 MED ORDER — NITROGLYCERIN 0.4 MG SL SUBL
0.4000 mg | SUBLINGUAL_TABLET | SUBLINGUAL | Status: DC | PRN
  Administered 2021-11-24: 0.4 mg via SUBLINGUAL
  Filled 2021-11-24: qty 1

## 2021-11-24 MED ORDER — NITROGLYCERIN 0.4 MG SL SUBL: 0.4000 mg | SUBLINGUAL_TABLET | SUBLINGUAL | Status: DC | PRN

## 2021-11-24 MED ORDER — ENOXAPARIN SODIUM 40 MG/0.4ML IJ SOSY
40.0000 mg | PREFILLED_SYRINGE | INTRAMUSCULAR | Status: DC
  Administered 2021-11-25: 40 mg via SUBCUTANEOUS
  Filled 2021-11-24: qty 0.4

## 2021-11-24 MED ORDER — METOPROLOL TARTRATE 25 MG PO TABS
37.5000 mg | ORAL_TABLET | Freq: Two times a day (BID) | ORAL | Status: DC
  Filled 2021-11-24: qty 2

## 2021-11-24 MED ORDER — NITROGLYCERIN IN D5W 200-5 MCG/ML-% IV SOLN
0.0000 ug/min | INTRAVENOUS | Status: DC
  Filled 2021-11-24: qty 250

## 2021-11-24 NOTE — ED Provider Notes (Signed)
This patient is a 81 year old male with a known history of intermittent paroxysmal atrial fibrillation, he is on hydrochlorothiazide and metoprolol as well as Benicar, he has no history of obstructive coronary disease and has had a prior echocardiogram a couple years ago after syncopal episode which was unremarkable.  He presents today with a complaint of some chest discomfort characterized as a dull pain across his chest with radiation into the bilateral arms from the shoulders down to the elbows.  It is aching.  He is not having any fevers or chills, he is not short of breath or diaphoretic and he is not nauseated.  This came on with exertion this evening, it has been fluctuating in intensity since that time.  On exam the patient has clear heart sounds, clear lung sounds, no murmurs, no edema, no JVD, no tachycardia though he is in atrial fibrillation with a slightly irregular rhythm.  EKG does not show any signs of acute ischemia, troponin pending, his risk for unstable angina is quite high, he has been given aspirin prehospital, we will give nitroglycerin and anticipate admission to the hospital as he will likely need provocative testing.  The patient is agreeable.  Thankfully first troponin is negative, blood pressure was severely elevated but improving with medications, I discussed the case with the on-call hospitalist, Dr. Josephine Cables who has been kind enough to admit this patient to the hospital.  Working diagnosis of unstable angina  .Critical Care Performed by: Noemi Chapel, MD Authorized by: Noemi Chapel, MD   Critical care provider statement:    Critical care time (minutes):  35   Critical care was necessary to treat or prevent imminent or life-threatening deterioration of the following conditions:  Cardiac failure   Critical care was time spent personally by me on the following activities:  Development of treatment plan with patient or surrogate, discussions with consultants, evaluation of  patient's response to treatment, examination of patient, ordering and review of laboratory studies, ordering and review of radiographic studies, ordering and performing treatments and interventions, pulse oximetry, re-evaluation of patient's condition, review of old charts and obtaining history from patient or surrogate   I assumed direction of critical care for this patient from another provider in my specialty: no     Care discussed with: admitting provider   Comments:       Final diagnoses:  Unstable angina ([place])      Noemi Chapel, MD 11/24/21 [id number]

## 2021-11-24 NOTE — ED Triage Notes (Signed)
Pt c/o generalized chest pain with radiation bilateral arms that started after walking to mailbox today. Pt states pain has subsided at this time.   Pt states had similar episode last night right after he laid down to sleep.

## 2021-11-24 NOTE — Assessment & Plan Note (Signed)
Continue latanoprost ?

## 2021-11-24 NOTE — ED Notes (Addendum)
Ok for pt to take BP med and metoprolol per DR Miller-pt given water to take meds

## 2021-11-24 NOTE — Assessment & Plan Note (Addendum)
°  Continue telemetry  Troponins x 1 - 14 EKG personally reviewed showed A-fib with rate control Chest pain resolved with nitroglycerin sublingual Cardiology will be consulted to help decide if Stress test is needed in am Versus other  diagnostic modalities.    Continue nitroglycerin prn

## 2021-11-24 NOTE — Assessment & Plan Note (Addendum)
Resolved status post home Lopressor and sub lingual nitro given in the ED

## 2021-11-24 NOTE — Assessment & Plan Note (Addendum)
Continue home Lopressor. Patient was not on any blood thinner.  He states that he stopped taking Eliquis a few years ago due to significant left-sided hematoma at the hip after a fall

## 2021-11-24 NOTE — Assessment & Plan Note (Signed)
-   Continue home meds °

## 2021-11-24 NOTE — ED Notes (Signed)
Patient transported to X-ray 

## 2021-11-24 NOTE — ED Notes (Signed)
Dr Josephine Cables at bedside- pt pain free at this time. Instructed to hold Nitro infusion per Dr Josephine Cables

## 2021-11-24 NOTE — ED Provider Notes (Signed)
[facility] EMERGENCY DEPARTMENT Provider Note   CSN: [id number] Arrival date & time: 11/24/21  1842     History  Chief Complaint  Patient presents with   Chest Pain    Bryan Cabrera is a 81 y.o. male with a history of paroxysmal atrial fibrillation, hypertriglyceridemia, OSA, HTN, erectile dysfunction, and BPH presenting today with 2 episodes of dyspnea on exertion.  First episode occurred last night when the patient went to lie down for bed, when he felt short of breath and felt pain radiating across his chest to both upper arms.  This initially went away on its own.  Today, patient was walking to his mailbox about 100 feet when he felt short of breath and lightheaded, with the same chest and arm pain.  He called 911 and was told to take 324 mg of ASA.  He denies currently experiencing SOB, chest pain, arm pain, lightheadedness, N/V, abdominal pain, epigastric pain, vision changes, slurred speech, numbness or tingling.  Denies history of MI or previous cardiac event.  Patient sees Dr. [name] of cardiology on [facility], and recently had a consult with Dr. Quentin Ore about possible placement of a Watchman.  Patient has had paroxysmal atrial fibrillation for the last 2 to 3 years.  He was placed on Eliquis up until 1 year ago and has not been on it since.  The history is provided by the patient and medical records.  Chest Pain Associated symptoms: no abdominal pain, no back pain, no cough, no diaphoresis, no dizziness, no fever, no headache, no nausea, no numbness, no palpitations, no shortness of breath and no vomiting       Home Medications Prior to Admission medications   Medication Sig Start Date End Date Taking? Authorizing Provider  hydrochlorothiazide (HYDRODIURIL) 25 MG tablet TAKE ONE TABLET (25MG  TOTAL) BY MOUTH DAILY WITH BREAKFAST 10/26/21   Panosh, Standley Brooking, MD  latanoprost (XALATAN) 0.005 % ophthalmic solution Place 1 drop into both eyes at bedtime. 08/09/18   Caren Macadam, MD  metoprolol tartrate (LOPRESSOR) 25 MG tablet Take 1 tablet (25 mg total) by mouth See admin instructions. Take one and one-half tablets by mouth 2 times daily 10/27/21   Panosh, Standley Brooking, MD  olmesartan (BENICAR) 40 MG tablet TAKE ONE TABLET (40MG  TOTAL) BY MOUTH DAILY 11/02/21   Panosh, Standley Brooking, MD  ondansetron (ZOFRAN) 8 MG tablet Take 1 tablet (8 mg total) by mouth every 6 (six) hours as needed for nausea or vomiting. 11/04/21   Laurey Morale, MD      Allergies    Ciprofloxacin, Metronidazole, Penicillins, Quinapril hcl, Tetracycline hcl, and Amoxicillin    Review of Systems   Review of Systems  Constitutional:  Negative for chills, diaphoresis and fever.  HENT:  Negative for congestion, ear pain, rhinorrhea and sore throat.   Eyes:  Negative for pain and visual disturbance.  Respiratory:  Negative for cough, chest tightness and shortness of breath.   Cardiovascular:  Positive for chest pain. Negative for palpitations.  Gastrointestinal:  Negative for abdominal pain, constipation, diarrhea, nausea and vomiting.  Genitourinary:  Negative for dysuria and hematuria.  Musculoskeletal:  Negative for arthralgias, back pain, neck pain and neck stiffness.  Skin:  Negative for color change and rash.  Neurological:  Positive for light-headedness. Negative for dizziness, seizures, syncope, facial asymmetry, speech difficulty, numbness and headaches.  All other systems reviewed and are negative.  Physical Exam Updated Vital Signs BP (!) 169/97 (BP Location: Left Arm)  Pulse 75    Temp 98.8 F (37.1 C) (Oral)    Resp 17    Ht 5\' 8"  (1.727 m)    Wt 94.8 kg    SpO2 98%    BMI 31.78 kg/m  Physical Exam Vitals and nursing note reviewed.  Constitutional:      General: He is not in acute distress.    Appearance: He is well-developed. He is not ill-appearing or diaphoretic.  HENT:     Head: Normocephalic and atraumatic.     Mouth/Throat:     Mouth: Mucous membranes are moist.      Pharynx: Oropharynx is clear.  Eyes:     General: No scleral icterus.    Conjunctiva/sclera: Conjunctivae normal.  Neck:     Vascular: No carotid bruit.  Cardiovascular:     Rate and Rhythm: Normal rate. Rhythm irregularly irregular.     Pulses:          Carotid pulses are 2+ on the right side and 2+ on the left side.      Radial pulses are 2+ on the right side and 2+ on the left side.       Dorsalis pedis pulses are 2+ on the right side and 2+ on the left side.       Posterior tibial pulses are 2+ on the right side and 2+ on the left side.     Heart sounds: Murmur heard.  Pulmonary:     Effort: Pulmonary effort is normal. No accessory muscle usage or respiratory distress.     Breath sounds: Normal breath sounds. No wheezing.  Chest:     Chest wall: No mass, deformity, tenderness, crepitus or edema.  Abdominal:     General: Bowel sounds are normal. There is no distension.     Palpations: Abdomen is soft.     Tenderness: There is no abdominal tenderness. There is no guarding or rebound.  Musculoskeletal:        General: No swelling.     Cervical back: Neck supple. No tenderness.     Right lower leg: No edema.     Left lower leg: No edema.  Skin:    General: Skin is warm and dry.     Capillary Refill: Capillary refill takes less than 2 seconds.     Coloration: Skin is not cyanotic, jaundiced or pale.  Neurological:     Mental Status: He is alert and oriented to person, place, and time.     GCS: GCS eye subscore is 4. GCS verbal subscore is 5. GCS motor subscore is 6.  Psychiatric:        Mood and Affect: Mood normal.    ED Results / Procedures / Treatments   Labs (all labs ordered are listed, but only abnormal results are displayed) Labs Reviewed  BASIC METABOLIC PANEL - Abnormal; Notable for the following components:      Result Value   Sodium 134 (*)    CO2 19 (*)    Glucose, Bld 115 (*)    All other components within normal limits  CBG MONITORING, ED - Abnormal;  Notable for the following components:   Glucose-Capillary 110 (*)    All other components within normal limits  TROPONIN I (HIGH SENSITIVITY) - Abnormal; Notable for the following components:   Troponin I (High Sensitivity) 24 (*)    All other components within normal limits  CBC  COMPREHENSIVE METABOLIC PANEL  CBC  APTT  MAGNESIUM  PHOSPHORUS  TROPONIN I (HIGH  SENSITIVITY)    EKG EKG Interpretation  Date/Time:  Thursday November 24 2021 19:07:42 EST Ventricular Rate:  81 PR Interval:    QRS Duration: 92 QT Interval:  405 QTC Calculation: 471 R Axis:   -10 Text Interpretation: Atrial fibrillation since last tracing no significant change Confirmed by Noemi Chapel [phone]) on 11/24/2021 7:11:21 PM  Radiology DG Chest 2 View  Result Date: 11/24/2021 CLINICAL DATA:  Chest pain EXAM: CHEST - 2 VIEW COMPARISON:  10/04/2021 FINDINGS: Numerous leads and wires project over the chest. Midline trachea. Normal heart size and mediastinal contours. No pleural effusion or pneumothorax. Clear lungs. IMPRESSION: No active cardiopulmonary disease. Electronically Signed   By: Abigail Miyamoto M.D.   On: 11/24/2021 19:39    Procedures .Critical Care Performed by: Prince Rome, [name] Authorized by: Prince Rome, [name]   Critical care provider statement:    Critical care time (minutes):  45   Critical care was necessary to treat or prevent imminent or life-threatening deterioration of the following conditions:  Cardiac failure   Critical care was time spent personally by me on the following activities:  Development of treatment plan with patient or surrogate, discussions with consultants, evaluation of patient's response to treatment, examination of patient, ordering and review of laboratory studies, ordering and review of radiographic studies, ordering and performing treatments and interventions, pulse oximetry, re-evaluation of patient's condition, review of old charts and obtaining history  from patient or surrogate    Medications Ordered in ED Medications  nitroGLYCERIN 50 mg in dextrose 5 % 250 mL (0.2 mg/mL) infusion (0 mcg/min Intravenous Hold 11/24/21 2119)  enoxaparin (LOVENOX) injection 40 mg (has no administration in time range)  nitroGLYCERIN (NITROSTAT) SL tablet 0.4 mg (has no administration in time range)  metoprolol tartrate (LOPRESSOR) tablet 37.5 mg (has no administration in time range)  latanoprost (XALATAN) 0.005 % ophthalmic solution 1 drop (has no administration in time range)    ED Course/ Medical Decision Making/ A&P                           Medical Decision Making Amount and/or Complexity of Data Reviewed Labs: ordered. Decision-making details documented in ED Course. Radiology: ordered and independent interpretation performed. Decision-making details documented in ED Course. ECG/medicine tests: ordered and independent interpretation performed. Decision-making details documented in ED Course.  Risk Decision regarding hospitalization.   81 year old male presents to the ED for concern of chest pain.  This involves an extensive number of treatment options, and is a complaint that carries with it a high risk of complications and morbidity.  The differential diagnosis includes stable angina, acute coronary syndrome, GERD, aortic dissection, musculoskeletal pain, acute pulmonary conditions such as PE or pneumothorax.  Comorbidities that complicate the patient evaluation include paroxysmal atrial fibrillation, hypertension, hypertriglyceridemia, and erectile dysfunction  Additional history obtained from internal/external records available via epic  Interpretation: I ordered, and personally interpreted labs.  The pertinent results include: Initial troponin of 14, which does not indicate an NSTEMI or STEMI.  Calcium is 9.3, which is within normal limits.  Patient is not anemic based on hemoglobin 15.3 and hematocrit of 44.4.  Electrolyte values were  unremarkable, with the exception of mild hyponatremia.  I ordered imaging studies including chest x-ray and EKG.  I independently visualized and interpreted imaging which showed no active cardiopulmonary disease and a rhythm of atrial fibrillation with a heart rate under 100.  No evidence of pneumothorax or pneumonia.  I agree with the radiologist interpretation  Echocardiogram from 11/2019 supports nonvalvular A-fib and indicated a LVEF of 70-75%.  Also indicated mild concentric LVH and no evidence of mitral valve regurgitation.  Also the aortic root was normal in size and structure at this time.   Intervention: I ordered medication including nitroglycerin for chest pain and dyspnea.  Reevaluation of the patient after these medicines showed that the patient mild relief.  I have reviewed the patients home medicines and have made adjustments as needed  Critical interventions include nitroglycerin drip and oxygen therapy  The patient was maintained on a cardiac monitor.  I personally viewed and interpreted the cardiac monitored which showed an underlying rhythm of: Atrial fibrillation with a rate under 100.  Vitals were watched closely, which showed consistent hypertension despite having taken his blood pressure medications as prescribed.  Diastolic was averaging 165-790 mmHg.  Dr. Sabra Heck requested consultation with the hospitalist, as described in his note.  I was informed that the recommendation was for the patient be admitted for continued evaluation, treatment, and observation.  ED Course: Patient arrived in no acute distress, not complaining of any chest pain, dyspnea on exertion, headedness, pain or discomfort in general.  Physical exam revealed no rash or laceration of the skin on and around his chest.  Also pain is not reproducible by palpation.  This is supportive of neither a musculoskeletal nor integumentary cause for the patient's presentation.  Patient also denies epigastric pain, and  states this is different from his previous pain associated with GERD.  Thus I am not suspicious of GERD as the cause of presentation.  Over his course, he developed unprovoked chest pain and dyspnea.  Oxygen and nitroglycerin were utilized.  Patient has been taking his beta-blocker as prescribed, and took it once he arrived to the ED.  He also had received 324 mg of aspirin before arriving to the ED. after the patient had been here for almost an hour, he began experiencing unprovoked shortness of breath and chest pain.  Nitroglycerin was provided along with oxygen to help relieve this discomfort.  The patient's presentation and history, in addition to response to the aspirin and oxygen and nitroglycerin are highly suggestive of acute cardiac nature such as acute coronary syndrome.  With negative cardiac markers and no significant ST changes on EKG or significant signs of ischemia on EKG, it is likely unstable angina rather than NSTEMI or STEMI.  I believe the patient would best be served with admission for further work-up and treatment, especially considering history of chronic hypertension and chronic atrial fibrillation and sudden onset.  Dispostion: I discussed the patient and their case with my attending, Dr. Sabra Heck, who agreed with the proposed treatment course.  After consideration of the diagnostic results and the patient's response to treatment, I feel that the patent would benefit from admission for further evaluation and assessment for acute coronary syndrome.  Discussed course of treatment thoroughly with the patient and he demonstrated understanding.  Patient in agreement and has no further questions.        Final Clinical Impression(s) / ED Diagnoses Final diagnoses:  Unstable angina [facility])    Rx / DC Orders ED Discharge Orders     None         Candace Cruise 38/33/38 0117    Noemi Chapel, MD 11/25/21 [phone]
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
## 2021-11-25 ENCOUNTER — Other Ambulatory Visit: Payer: Self-pay

## 2021-11-25 ENCOUNTER — Other Ambulatory Visit: Payer: Self-pay | Admitting: Student

## 2021-11-25 DIAGNOSIS — Z87891 Personal history of nicotine dependence: Secondary | ICD-10-CM | POA: Diagnosis not present

## 2021-11-25 DIAGNOSIS — I480 Paroxysmal atrial fibrillation: Secondary | ICD-10-CM | POA: Diagnosis not present

## 2021-11-25 DIAGNOSIS — I160 Hypertensive urgency: Secondary | ICD-10-CM | POA: Diagnosis not present

## 2021-11-25 DIAGNOSIS — I208 Other forms of angina pectoris: Secondary | ICD-10-CM

## 2021-11-25 DIAGNOSIS — I200 Unstable angina: Secondary | ICD-10-CM | POA: Diagnosis not present

## 2021-11-25 DIAGNOSIS — Z79899 Other long term (current) drug therapy: Secondary | ICD-10-CM | POA: Diagnosis not present

## 2021-11-25 DIAGNOSIS — I4821 Permanent atrial fibrillation: Secondary | ICD-10-CM | POA: Diagnosis not present

## 2021-11-25 DIAGNOSIS — R0789 Other chest pain: Secondary | ICD-10-CM | POA: Diagnosis not present

## 2021-11-25 DIAGNOSIS — I10 Essential (primary) hypertension: Secondary | ICD-10-CM | POA: Diagnosis not present

## 2021-11-25 LAB — CBC
HCT: 40.8 % (ref 39.0–52.0)
Hemoglobin: 13.2 g/dL (ref 13.0–17.0)
MCH: 27.7 pg (ref 26.0–34.0)
MCHC: 32.4 g/dL (ref 30.0–36.0)
MCV: 85.5 fL (ref 80.0–100.0)
Platelets: 201 10*3/uL (ref 150–400)
RBC: 4.77 MIL/uL (ref 4.22–5.81)
RDW: 13.3 % (ref 11.5–15.5)
WBC: 6.4 10*3/uL (ref 4.0–10.5)
nRBC: 0 % (ref 0.0–0.2)

## 2021-11-25 LAB — COMPREHENSIVE METABOLIC PANEL
ALT: 32 U/L (ref 0–44)
AST: 23 U/L (ref 15–41)
Albumin: 3.5 g/dL (ref 3.5–5.0)
Alkaline Phosphatase: 43 U/L (ref 38–126)
Anion gap: 5 (ref 5–15)
BUN: 16 mg/dL (ref 8–23)
CO2: 27 mmol/L (ref 22–32)
Calcium: 9.6 mg/dL (ref 8.9–10.3)
Chloride: 109 mmol/L (ref 98–111)
Creatinine, Ser: 0.99 mg/dL (ref 0.61–1.24)
GFR, Estimated: >60 mL/min (ref >60)
Glucose, Bld: 111 mg/dL — ABNORMAL HIGH (ref 70–99)
Potassium: 3.6 mmol/L (ref 3.5–5.1)
Sodium: 141 mmol/L (ref 135–145)
Total Bilirubin: 0.8 mg/dL (ref 0.3–1.2)
Total Protein: 5.8 g/dL — ABNORMAL LOW (ref 6.5–8.1)

## 2021-11-25 LAB — MAGNESIUM: Magnesium: 2.1 mg/dL (ref 1.7–2.4)

## 2021-11-25 LAB — APTT: aPTT: 28 seconds (ref 24–36)

## 2021-11-25 LAB — PHOSPHORUS: Phosphorus: 2.1 mg/dL — ABNORMAL LOW (ref 2.5–4.6)

## 2021-11-25 MED ORDER — LOSARTAN POTASSIUM-HCTZ 50-12.5 MG PO TABS: 1.0000 | ORAL_TABLET | Freq: Every day | ORAL | 11 refills | Status: DC

## 2021-11-25 MED ORDER — METOPROLOL TARTRATE 25 MG PO TABS: 25.0000 mg | ORAL_TABLET | ORAL | 1 refills | Status: DC

## 2021-11-25 MED ORDER — IRBESARTAN 150 MG PO TABS
300.0000 mg | ORAL_TABLET | Freq: Once | ORAL | Status: AC
  Administered 2021-11-25: 300 mg via ORAL
  Filled 2021-11-25: qty 2

## 2021-11-25 MED ORDER — POTASSIUM CHLORIDE ER 10 MEQ PO TBCR: 10.0000 meq | EXTENDED_RELEASE_TABLET | Freq: Every day | ORAL | 1 refills | Status: DC

## 2021-11-25 MED ORDER — ASPIRIN EC 81 MG PO TBEC: 81.0000 mg | DELAYED_RELEASE_TABLET | Freq: Every day | ORAL | 2 refills | Status: DC

## 2021-11-25 MED ORDER — AMLODIPINE BESYLATE 5 MG PO TABS: 5.0000 mg | ORAL_TABLET | Freq: Once | ORAL | Status: DC

## 2021-11-25 MED ORDER — ISOSORBIDE MONONITRATE ER 30 MG PO TB24: 30.0000 mg | ORAL_TABLET | Freq: Every day | ORAL | 0 refills | Status: DC
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
## 2021-11-25 NOTE — Assessment & Plan Note (Signed)
Overall reasonable blood pressure control.  Continue with current medication management.  No changes made.

## 2021-11-25 NOTE — Progress Notes (Signed)
Patient has been a &O and stable this shift. Patient has had no c/o chest pain since admission to floor.

## 2021-11-25 NOTE — Assessment & Plan Note (Addendum)
Heart rates under reasonable control.  Mostly in the 48s and 80s.  He did have some periods in the 50s earlier this morning.  He is ambulating well without any rapid ventricular response.  We discussed his recent consultation with Dr. Quentin Ore with EP.  He is going to think about Watchman device.  We have avoided anticoagulation because of prior hematoma on hip after fall.

## 2021-11-25 NOTE — Discharge Instructions (Signed)
1)Avoid ibuprofen/Advil/Aleve/Motrin/Goody Powders/Naproxen/BC powders/Meloxicam/Diclofenac/Indomethacin and other Nonsteroidal anti-inflammatory medications as these will make you more likely to bleed and can cause stomach ulcers, can also cause Kidney problems.   2) please avoid strenuous activity until you get your stress test done by cardiology team  3) please note that there has been a few changes to your medications

## 2021-11-25 NOTE — Progress Notes (Signed)
°  Transition of Care [facility]) Screening Note   Patient Details  Name: Bryan Cabrera Date of Birth: [birth year]   Transition of Care [facility]) CM/SW Contact:    Boneta Lucks, RN Phone Number: 11/25/2021, 12:53 PM  Cardiology following  Transition of Care Department [facility]) has reviewed patient and no TOC needs have been identified at this time. We will continue to monitor patient advancement through interdisciplinary progression rounds. If new patient transition needs arise, please place a TOC consult.
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
## 2021-11-25 NOTE — Assessment & Plan Note (Signed)
Troponin very low level elevated at 24 up from 14 initially.  EKG shows rate controlled atrial fibrillation with PVCs but no ischemic changes.  Currently feeling better.  I think it is reasonable to discharge.  As outpatient, we will set him up with stress test at our [facility] office.  We will arrange.  He is currently doing well.  He states that on Wednesday night he was laying down and felt chest pain go across his chest and into both arms.  This was unusual for him.  He tried to tough it out but ended up coming in yesterday.  Thankfully, troponin does not demonstrate any significant myocardial injury.  I feel comfortable with him going on home and getting an expedited Lexiscan stress test.

## 2021-11-28 ENCOUNTER — Telehealth: Payer: Self-pay

## 2021-11-28 ENCOUNTER — Telehealth (HOSPITAL_COMMUNITY): Payer: Self-pay | Admitting: *Deleted

## 2021-11-28 NOTE — Telephone Encounter (Signed)
Patient called in to return Rebecca's phone call. Attempted to schedule his HFU but he have other appointments going on.  Patient could be contacted at [phone].  Please advise.

## 2021-11-28 NOTE — Telephone Encounter (Addendum)
Transition Care Management Unsuccessful Follow-up Telephone Call  Date of discharge and from where:[facility] 11-25-21 Dx: Atypical chest pain   Attempts:  1st Attempt  Reason for unsuccessful TCM follow-up call:  Left voice message  Transition Care Management Follow-up Telephone Call Date of discharge and from where: [facility] 11-25-21 Dx: Atypical chest pain  How have you been since you were released from the hospital? fair Any questions or concerns? Yes BP was 139-97- is speaking with cardiologist about this  Items Reviewed: Did the pt receive and understand the discharge instructions provided? Yes  Medications obtained and verified? Yes  Other? No  Any new allergies since your discharge? Yes  Dietary orders reviewed? Yes Do you have support at home? Yes   Home Care and Equipment/Supplies: Were home health services ordered? no If so, what is the name of the agency? na  Has the agency set up a time to come to the patient's home? not applicable Were any new equipment or medical supplies ordered?  No What is the name of the medical supply agency? na Were you able to get the supplies/equipment? no Do you have any questions related to the use of the equipment or supplies? No  Functional Questionnaire: (I = Independent and D = Dependent) ADLs: I  Bathing/Dressing- I  Meal Prep- I  Eating- I  Maintaining continence- I  Transferring/Ambulation- I  Managing Meds- I  Follow up appointments reviewed:  PCP Hospital f/u appt confirmed? Yes  Scheduled to see Dr Regis Bill on 12-07-21 @ 1130am. [facility] f/u appt confirmed? Yes  Scheduled to see Dr [name] on 11-30-21 @ 10am. Are transportation arrangements needed? No  If their condition worsens, is the pt aware to call PCP or go to the Emergency Dept.? Yes Was the patient provided with contact information for the PCP's office or ED? Yes Was to pt encouraged to call back with questions or concerns? Yes

## 2021-11-28 NOTE — Telephone Encounter (Signed)
Patient given detailed instructions per Myocardial Perfusion Study Information Sheet for the test on 11/30/21  Patient notified to arrive 15 minutes early and that it is imperative to arrive on time for appointment to keep from having the test rescheduled.  If you need to cancel or reschedule your appointment, please call the office within 24 hours of your appointment. . Patient verbalized understanding. Kirstie Peri

## 2021-11-28 NOTE — Telephone Encounter (Signed)
I spoke with the pt and he has been scheduled for ED f/u on 02/21. Pt reported having high BP readings today. Pt stated he took his BP 2 hours ago with a reading of 139/97. Pt denies any shortness of breath, chest pain/tightness at this time. Pt stated he was given new BP medications during ED visit and is concerned with latest readings, Please advise

## 2021-11-29 NOTE — Telephone Encounter (Signed)
I do not have enough information to advise past what is already been said if he is having no symptoms the blood pressure reading obtained is not immediately dangerous.  Although it is not at goal.  Need visit of some type to advise  Can also contact his cardiology team for further advice in the interim.

## 2021-11-30 ENCOUNTER — Other Ambulatory Visit: Payer: Self-pay

## 2021-11-30 ENCOUNTER — Ambulatory Visit (HOSPITAL_COMMUNITY): Payer: Medicare PPO | Attending: Cardiovascular Disease

## 2021-11-30 DIAGNOSIS — I208 Other forms of angina pectoris: Secondary | ICD-10-CM | POA: Diagnosis not present

## 2021-11-30 LAB — MYOCARDIAL PERFUSION IMAGING
LV dias vol: 77 mL (ref 62–150)
LV sys vol: 25 mL
Nuc Stress EF: 68 %
Peak HR: 82 {beats}/min
Rest HR: 67 {beats}/min
Rest Nuclear Isotope Dose: 9.7 mCi
SDS: 5
SRS: 0
SSS: 5
ST Depression (mm): 0 mm
Stress Nuclear Isotope Dose: 31.3 mCi
TID: 0.9

## 2021-11-30 MED ORDER — REGADENOSON 0.4 MG/5ML IV SOLN
0.4000 mg | Freq: Once | INTRAVENOUS | Status: AC
  Administered 2021-11-30: 0.4 mg via INTRAVENOUS

## 2021-11-30 MED ORDER — TECHNETIUM TC 99M TETROFOSMIN IV KIT
31.3000 | PACK | Freq: Once | INTRAVENOUS | Status: AC | PRN
  Administered 2021-11-30: 31.3 via INTRAVENOUS
  Filled 2021-11-30: qty 32

## 2021-11-30 MED ORDER — TECHNETIUM TC 99M TETROFOSMIN IV KIT
9.7000 | PACK | Freq: Once | INTRAVENOUS | Status: AC | PRN
  Administered 2021-11-30: 9.7 via INTRAVENOUS
  Filled 2021-11-30: qty 10

## 2021-11-30 NOTE — Telephone Encounter (Signed)
Pt informed of the message and verbalized understanding. Pt has been scheduled for 12/06/2021.

## 2021-12-02 ENCOUNTER — Telehealth: Payer: Self-pay

## 2021-12-02 NOTE — Telephone Encounter (Signed)
Pt stating that his BP's have been high since leaving the hospital on 2/9 and supplied the below readings: 2/11 131/70 2/13 139/97 2/14 131/95 2/15 161/96 2/16 148/94 Informed pt that the above readings for the first 3 days do not look significantly elevated. States that all readings were obtained in the afternoon, while resting, and post-medication (AM doses).  Per [facility] discharge from 2/9, they took pt off of olmesartan 40 AND HCTZ 25mg  and started him on hyzaar 50/12.5mg . He feels this change is not lowering his pressure enough and states that his previous diastolic numbers were "about 20 points lower."  Pt also states that yesterday while doing light activity in the kitchen (washing dishes and moving around) he experienced CP and pain into both arms. Denies SOB, N/V, or diaphoresis. Pt did not use and states he dose not have NTG. Wants to know if this pain is his "new normal, like this is just what I should expect," or if there's something that should be done since his test results were normal.  Will send to [facility] for advisement

## 2021-12-06 ENCOUNTER — Encounter: Payer: Self-pay | Admitting: Internal Medicine

## 2021-12-06 ENCOUNTER — Ambulatory Visit: Payer: Medicare PPO | Admitting: Internal Medicine

## 2021-12-06 VITALS — BP 122/74 | HR 75 | Temp 97.7°F | Ht 68.0 in | Wt 205.8 lb

## 2021-12-06 DIAGNOSIS — I480 Paroxysmal atrial fibrillation: Secondary | ICD-10-CM | POA: Diagnosis not present

## 2021-12-06 DIAGNOSIS — Z79899 Other long term (current) drug therapy: Secondary | ICD-10-CM | POA: Diagnosis not present

## 2021-12-06 DIAGNOSIS — R0609 Other forms of dyspnea: Secondary | ICD-10-CM | POA: Diagnosis not present

## 2021-12-06 DIAGNOSIS — I10 Essential (primary) hypertension: Secondary | ICD-10-CM | POA: Diagnosis not present

## 2021-12-06 DIAGNOSIS — R079 Chest pain, unspecified: Secondary | ICD-10-CM | POA: Diagnosis not present

## 2021-12-06 DIAGNOSIS — Z8616 Personal history of COVID-19: Secondary | ICD-10-CM

## 2021-12-06 MED ORDER — HYDROCHLOROTHIAZIDE 25 MG PO TABS: 25.0000 mg | ORAL_TABLET | Freq: Every day | ORAL | 3 refills | Status: DC

## 2021-12-06 MED ORDER — OLMESARTAN MEDOXOMIL 40 MG PO TABS: 40.0000 mg | ORAL_TABLET | Freq: Every day | ORAL | 3 refills | Status: DC

## 2021-12-06 NOTE — Progress Notes (Signed)
H
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
## 2021-12-06 NOTE — Addendum Note (Signed)
Addended by: Ma Hillock on: 12/06/2021 11:28 AM   Modules accepted: Orders

## 2021-12-06 NOTE — Patient Instructions (Signed)
Agree with cardiology  follow up this week. Since you are still  having pain and decrease exercise tolerance.    .  Changing back to olmesartan stronger  for BP lowering than losartan . Keep appt with Dr. [name]  this week.

## 2021-12-06 NOTE — Telephone Encounter (Signed)
Called and spoke with patient who understands to discontinue the current hyzaar (losartan/hctz) and begin taking again his olmesartan 40mg  and hctz 25mg . Understands that he needs to be seen in office to discuss need for cath. Will schedule appt with covering RN.

## 2021-12-07 ENCOUNTER — Ambulatory Visit: Payer: Medicare PPO | Admitting: Internal Medicine

## 2021-12-08 ENCOUNTER — Other Ambulatory Visit: Payer: Self-pay

## 2021-12-08 ENCOUNTER — Encounter: Payer: Self-pay | Admitting: Cardiology

## 2021-12-08 ENCOUNTER — Ambulatory Visit: Payer: Medicare PPO | Admitting: Cardiology

## 2021-12-08 VITALS — BP 140/70 | HR 68 | Ht 68.0 in | Wt 204.0 lb

## 2021-12-08 DIAGNOSIS — I4821 Permanent atrial fibrillation: Secondary | ICD-10-CM

## 2021-12-08 DIAGNOSIS — Z01812 Encounter for preprocedural laboratory examination: Secondary | ICD-10-CM | POA: Diagnosis not present

## 2021-12-08 DIAGNOSIS — I10 Essential (primary) hypertension: Secondary | ICD-10-CM | POA: Diagnosis not present

## 2021-12-08 DIAGNOSIS — I209 Angina pectoris, unspecified: Secondary | ICD-10-CM
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
## 2021-12-08 NOTE — Patient Instructions (Signed)
Medication Instructions:  The current medical regimen is effective;  continue present plan and medications.  *If you need a refill on your cardiac medications before your next appointment, please call your pharmacy*   Lab Work: Please have blood work today (CBC, BMP)  If you have labs (blood work) drawn today and your tests are completely normal, you will receive your results only by: MyChart Message (if you have [place]) OR A paper copy in the mail If you have any lab test that is abnormal or we need to change your treatment, we will call you to review the results.   Testing/Procedures:   [facility], [place] [address] Dept: [phone] Loc: [place]  12/08/2021  You are scheduled for a Cardiac Catheterization on Tuesday, February 28 with Dr. [name].  1. Please arrive at the [facility] (Main Entrance A) at [facility]: [place], [place] [address] at 7:00 AM (two hours before your procedure to ensure your preparation). Free valet parking service is available.   Special note: Every effort is made to have your procedure done on time. Please understand that emergencies sometimes delay scheduled procedures.  2. Diet: Do not eat or drink anything after midnight prior to your procedure except sips of water to take medications.  3. Labs: Today  4. Medication instructions in preparation for your procedure:  Hold Hydrochlorothiazide this AM.  On the morning of your procedure, take your Aspirin and any morning medicines NOT listed above.  You may use sips of water.  5. Plan for one night stay--bring personal belongings. 6. Bring a current list of your medications and current insurance cards. 7. You MUST have a responsible person to drive you home. 8. Someone MUST be with you the first 24 hours after you arrive home or  your discharge will be delayed. 9. Please wear clothes that are easy to get on and off and wear slip-on shoes.  Thank you for allowing [place] to care for you!   -- [place] [facility]  Follow-Up: At [facility], you and your health needs are our priority.  As part of our continuing mission to provide you with exceptional heart care, we have created designated Provider Care Teams.  These Care Teams include your primary Cardiologist (physician) and Advanced Practice Providers (APPs -  Physician Assistants and Nurse Practitioners) who all work together to provide you with the care you need, when you need it.  We recommend signing up for the patient portal called "MyChart".  Sign up information is provided on this After Visit Summary.  MyChart is used to connect with patients for Virtual Visits (Telemedicine).  Patients are able to view lab/test results, encounter notes, upcoming appointments, etc.  Non-urgent messages can be sent to your provider as well.   To learn more about what you can do with MyChart, go to [email or web address].    Your next appointment:   2 week(s)  The format for your next appointment:   In Person  Provider:   [name], MD {   Thank you for choosing [place]!!

## 2021-12-08 NOTE — Assessment & Plan Note (Signed)
We have put him back on his olmesartan 40 mg and his HCTZ 25 mg.  He states that he feels better on this.  His blood pressure is under better control.  When I saw him in [place] was being utilized.
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
## 2021-12-08 NOTE — Assessment & Plan Note (Addendum)
Appreciate Dr. Quentin Ore seeing him with electrophysiology.  Reviewed notes.  He could be a watchman candidate.  He states that he really has not had time to think about this because of his recent chest pain episodes.  What I would like to do is see him back post catheterization and resume Eliquis.  He has been off of Eliquis for quite some time after suffering a fall that resulted in large left hip hematoma then seroma.  He reported that he has had 2 separate episodes where he felt like he was slurring his words slightly lasting less than 1 minute.  I think given these findings, I would like for him to be back on anticoagulation as soon as it is safely possible.

## 2021-12-08 NOTE — Assessment & Plan Note (Signed)
Continues to have fairly classic chest discomfort/anginal symptoms.  Despite what looks like normal perfusion on nuclear stress test, we will go ahead and proceed with left heart catheterization with possible percutaneous intervention.  He certainly could have balanced ischemia.  His brother has had bypass surgery and prior to that 9 stents.  Risks and benefits of procedure including stroke heart attack death renal impairment bleeding have been discussed.  He is willing to proceed.

## 2021-12-09 ENCOUNTER — Other Ambulatory Visit: Payer: Self-pay | Admitting: Cardiology

## 2021-12-09 DIAGNOSIS — I209 Angina pectoris, unspecified: Secondary | ICD-10-CM

## 2021-12-09 LAB — BASIC METABOLIC PANEL
BUN/Creatinine Ratio: 14 (ref 10–24)
BUN: 17 mg/dL (ref 8–27)
CO2: 22 mmol/L (ref 20–29)
Calcium: 10.6 mg/dL — ABNORMAL HIGH (ref 8.6–10.2)
Chloride: 104 mmol/L (ref 96–106)
Creatinine, Ser: 1.25 mg/dL (ref 0.76–1.27)
Glucose: 105 mg/dL — ABNORMAL HIGH (ref 70–99)
Potassium: 4.9 mmol/L (ref 3.5–5.2)
Sodium: 138 mmol/L (ref 134–144)
eGFR: 58 mL/min/{1.73_m2} — ABNORMAL LOW (ref >59)

## 2021-12-09 LAB — CBC
Hematocrit: 45.8 % (ref 37.5–51.0)
Hemoglobin: 15.3 g/dL (ref 13.0–17.7)
MCH: 28.2 pg (ref 26.6–33.0)
MCHC: 33.4 g/dL (ref 31.5–35.7)
MCV: 85 fL (ref 79–97)
Platelets: 256 10*3/uL (ref 150–450)
RBC: 5.42 x10E6/uL (ref 4.14–5.80)
RDW: 13.4 % (ref 11.6–15.4)
WBC: 7.2 10*3/uL (ref 3.4–10.8)

## 2021-12-12 ENCOUNTER — Telehealth: Payer: Self-pay | Admitting: *Deleted

## 2021-12-12 NOTE — Telephone Encounter (Addendum)
Cardiac catheterization scheduled at [facility] for: Tuesday December 13, 2021 [facility] Main Entrance A [facility]) at: 7 AM   Diet-no solid food after midnight prior to cath, clear liquids until 5 AM day of procedure.  Medication instructions for procedure: -Hold:  HCTZ/KCl/Olmesartan -day before and day of procedure-per protocol GFR 58 -Usual morning medications can be taken pre-cath with sips of water including aspirin 81 mg.    Must have responsible adult to drive home post procedure and be with patient first 24 hours after arriving home.  [facility] does allow one visitor to wait in the waiting room during the time you are there.   Patient reports does not currently have any new symptoms concerning for COVID-19 and no household members with COVID-19 like illness.        Reviewed procedure instructions with patient.   '

## 2021-12-13 ENCOUNTER — Other Ambulatory Visit: Payer: Self-pay

## 2021-12-13 ENCOUNTER — Ambulatory Visit: Payer: Medicare PPO | Admitting: Internal Medicine

## 2021-12-13 ENCOUNTER — Ambulatory Visit (HOSPITAL_COMMUNITY)
Admission: RE | Admit: 2021-12-13 | Discharge: 2021-12-14 | Disposition: A | Discharge status: Home / Self Care | Payer: Medicare PPO | Attending: Cardiology | Admitting: Cardiology

## 2021-12-13 ENCOUNTER — Encounter (HOSPITAL_COMMUNITY): Payer: Self-pay | Admitting: Cardiology

## 2021-12-13 ENCOUNTER — Ambulatory Visit (HOSPITAL_COMMUNITY)
Admission: RE | Disposition: A | Discharge status: Home / Self Care | Payer: Self-pay | Source: Home / Self Care | Attending: Cardiology

## 2021-12-13 DIAGNOSIS — I10 Essential (primary) hypertension: Secondary | ICD-10-CM | POA: Insufficient documentation

## 2021-12-13 DIAGNOSIS — I2582 Chronic total occlusion of coronary artery: Secondary | ICD-10-CM | POA: Diagnosis not present

## 2021-12-13 DIAGNOSIS — I25119 Atherosclerotic heart disease of native coronary artery with unspecified angina pectoris: Secondary | ICD-10-CM | POA: Insufficient documentation

## 2021-12-13 DIAGNOSIS — Z955 Presence of coronary angioplasty implant and graft: Secondary | ICD-10-CM

## 2021-12-13 DIAGNOSIS — Z8616 Personal history of COVID-19: Secondary | ICD-10-CM | POA: Diagnosis not present

## 2021-12-13 DIAGNOSIS — E785 Hyperlipidemia, unspecified: Secondary | ICD-10-CM | POA: Diagnosis not present

## 2021-12-13 DIAGNOSIS — I209 Angina pectoris, unspecified: Secondary | ICD-10-CM | POA: Diagnosis present

## 2021-12-13 DIAGNOSIS — E782 Mixed hyperlipidemia: Secondary | ICD-10-CM

## 2021-12-13 DIAGNOSIS — Z8249 Family history of ischemic heart disease and other diseases of the circulatory system: Secondary | ICD-10-CM | POA: Diagnosis not present

## 2021-12-13 DIAGNOSIS — I4819 Other persistent atrial fibrillation: Secondary | ICD-10-CM | POA: Insufficient documentation

## 2021-12-13 DIAGNOSIS — Z20822 Contact with and (suspected) exposure to covid-19: Secondary | ICD-10-CM | POA: Diagnosis not present

## 2021-12-13 DIAGNOSIS — I25110 Atherosclerotic heart disease of native coronary artery with unstable angina pectoris: Secondary | ICD-10-CM

## 2021-12-13 HISTORY — PX: LEFT HEART CATH AND CORONARY ANGIOGRAPHY: CATH118249

## 2021-12-13 HISTORY — PX: CORONARY STENT INTERVENTION: CATH118234

## 2021-12-13 LAB — RESP PANEL BY RT-PCR (FLU A&B, COVID) ARPGX2
Influenza A by PCR: NEGATIVE
Influenza B by PCR: NEGATIVE
SARS Coronavirus 2 by RT PCR: NEGATIVE

## 2021-12-13 SURGERY — LEFT HEART CATH AND CORONARY ANGIOGRAPHY
Anesthesia: LOCAL

## 2021-12-13 MED ORDER — SODIUM CHLORIDE 0.9% FLUSH: 3.0000 mL | INTRAVENOUS | Status: DC | PRN

## 2021-12-13 MED ORDER — FENTANYL CITRATE (PF) 100 MCG/2ML IJ SOLN
INTRAMUSCULAR | Status: DC | PRN
  Administered 2021-12-13: 25 ug via INTRAVENOUS
  Administered 2021-12-13: 50 ug via INTRAVENOUS

## 2021-12-13 MED ORDER — LABETALOL HCL 5 MG/ML IV SOLN
10.0000 mg | INTRAVENOUS | Status: AC | PRN
  Administered 2021-12-13: 10 mg via INTRAVENOUS

## 2021-12-13 MED ORDER — SODIUM CHLORIDE 0.9 % WEIGHT BASED INFUSION
3.0000 mL/kg/h | INTRAVENOUS | Status: DC
  Administered 2021-12-13: 3 mL/kg/h via INTRAVENOUS

## 2021-12-13 MED ORDER — HYDRALAZINE HCL 20 MG/ML IJ SOLN: 10.0000 mg | INTRAMUSCULAR | Status: AC | PRN

## 2021-12-13 MED ORDER — NITROGLYCERIN 0.4 MG SL SUBL
SUBLINGUAL_TABLET | SUBLINGUAL | Status: AC
  Filled 2021-12-13: qty 1

## 2021-12-13 MED ORDER — LIDOCAINE HCL (PF) 1 % IJ SOLN
INTRAMUSCULAR | Status: DC | PRN
Start: 2021-12-13 — End: 2021-12-13
  Administered 2021-12-13: 2 mL

## 2021-12-13 MED ORDER — FENTANYL CITRATE (PF) 100 MCG/2ML IJ SOLN
INTRAMUSCULAR | Status: AC
Start: 2021-12-13 — End: ?
  Filled 2021-12-13: qty 2

## 2021-12-13 MED ORDER — LATANOPROST 0.005 % OP SOLN
1.0000 [drp] | Freq: Every day | OPHTHALMIC | Status: DC
  Administered 2021-12-13: 1 [drp] via OPHTHALMIC
  Filled 2021-12-13: qty 2.5

## 2021-12-13 MED ORDER — METOPROLOL TARTRATE 25 MG PO TABS
37.5000 mg | ORAL_TABLET | Freq: Two times a day (BID) | ORAL | Status: DC
  Administered 2021-12-13 – 2021-12-14 (×2): 37.5 mg via ORAL
  Filled 2021-12-13 (×2): qty 2

## 2021-12-13 MED ORDER — VERAPAMIL HCL 2.5 MG/ML IV SOLN
INTRAVENOUS | Status: DC | PRN
  Administered 2021-12-13: 10 mL via INTRA_ARTERIAL

## 2021-12-13 MED ORDER — SODIUM CHLORIDE 0.9 % IV SOLN: 250.0000 mL | INTRAVENOUS | Status: DC | PRN

## 2021-12-13 MED ORDER — IOHEXOL 350 MG/ML SOLN
INTRAVENOUS | Status: DC | PRN
  Administered 2021-12-13: 100 mL

## 2021-12-13 MED ORDER — HEPARIN SODIUM (PORCINE) 1000 UNIT/ML IJ SOLN
INTRAMUSCULAR | Status: AC
  Filled 2021-12-13: qty 10

## 2021-12-13 MED ORDER — CLOPIDOGREL BISULFATE 300 MG PO TABS
ORAL_TABLET | ORAL | Status: DC | PRN
Start: 2021-12-13 — End: 2021-12-13
  Administered 2021-12-13: 600 mg via ORAL

## 2021-12-13 MED ORDER — SODIUM CHLORIDE 0.9% FLUSH
3.0000 mL | Freq: Two times a day (BID) | INTRAVENOUS | Status: DC
  Administered 2021-12-13 – 2021-12-14 (×2): 3 mL via INTRAVENOUS

## 2021-12-13 MED ORDER — MIDAZOLAM HCL 2 MG/2ML IJ SOLN
INTRAMUSCULAR | Status: AC
  Filled 2021-12-13: qty 2

## 2021-12-13 MED ORDER — SODIUM CHLORIDE 0.9 % WEIGHT BASED INFUSION: 1.0000 mL/kg/h | INTRAVENOUS | Status: AC

## 2021-12-13 MED ORDER — FAMOTIDINE IN NACL 20-0.9 MG/50ML-% IV SOLN
INTRAVENOUS | Status: AC
  Filled 2021-12-13: qty 50

## 2021-12-13 MED ORDER — HEPARIN (PORCINE) IN NACL 1000-0.9 UT/500ML-% IV SOLN
INTRAVENOUS | Status: AC
  Filled 2021-12-13: qty 1000

## 2021-12-13 MED ORDER — ACETAMINOPHEN 325 MG PO TABS: 650.0000 mg | ORAL_TABLET | ORAL | Status: DC | PRN

## 2021-12-13 MED ORDER — MIDAZOLAM HCL 2 MG/2ML IJ SOLN
INTRAMUSCULAR | Status: DC | PRN
  Administered 2021-12-13: 1 mg via INTRAVENOUS

## 2021-12-13 MED ORDER — HEPARIN SODIUM (PORCINE) 1000 UNIT/ML IJ SOLN
INTRAMUSCULAR | Status: DC | PRN
  Administered 2021-12-13 (×2): 4500 [IU] via INTRAVENOUS

## 2021-12-13 MED ORDER — ASPIRIN 81 MG PO CHEW: 81.0000 mg | CHEWABLE_TABLET | ORAL | Status: DC

## 2021-12-13 MED ORDER — NITROGLYCERIN 1 MG/10 ML FOR IR/CATH LAB
INTRA_ARTERIAL | Status: AC
  Filled 2021-12-13: qty 10

## 2021-12-13 MED ORDER — IRBESARTAN 150 MG PO TABS
300.0000 mg | ORAL_TABLET | Freq: Every day | ORAL | Status: DC
  Administered 2021-12-14: 300 mg via ORAL
  Filled 2021-12-13: qty 2

## 2021-12-13 MED ORDER — HEPARIN (PORCINE) IN NACL 1000-0.9 UT/500ML-% IV SOLN
INTRAVENOUS | Status: DC | PRN
  Administered 2021-12-13 (×2): 500 mL

## 2021-12-13 MED ORDER — ONDANSETRON HCL 4 MG PO TABS: 8.0000 mg | ORAL_TABLET | Freq: Four times a day (QID) | ORAL | Status: DC | PRN

## 2021-12-13 MED ORDER — ONDANSETRON HCL 4 MG/2ML IJ SOLN: 4.0000 mg | Freq: Four times a day (QID) | INTRAMUSCULAR | Status: DC | PRN

## 2021-12-13 MED ORDER — LIDOCAINE HCL (PF) 1 % IJ SOLN
INTRAMUSCULAR | Status: AC
  Filled 2021-12-13: qty 30

## 2021-12-13 MED ORDER — SODIUM CHLORIDE 0.9% FLUSH: 3.0000 mL | Freq: Two times a day (BID) | INTRAVENOUS | Status: DC

## 2021-12-13 MED ORDER — ASPIRIN EC 81 MG PO TBEC
81.0000 mg | DELAYED_RELEASE_TABLET | Freq: Every day | ORAL | Status: DC
  Administered 2021-12-14: 81 mg via ORAL
  Filled 2021-12-13: qty 1

## 2021-12-13 MED ORDER — SODIUM CHLORIDE 0.9 % WEIGHT BASED INFUSION: 1.0000 mL/kg/h | INTRAVENOUS | Status: DC

## 2021-12-13 MED ORDER — POTASSIUM CHLORIDE CRYS ER 10 MEQ PO TBCR: 10.0000 meq | EXTENDED_RELEASE_TABLET | Freq: Every day | ORAL | Status: DC

## 2021-12-13 MED ORDER — CLOPIDOGREL BISULFATE 75 MG PO TABS
75.0000 mg | ORAL_TABLET | Freq: Every day | ORAL | Status: DC
  Administered 2021-12-14: 75 mg via ORAL
  Filled 2021-12-13: qty 1

## 2021-12-13 MED ORDER — HYDROCHLOROTHIAZIDE 25 MG PO TABS
25.0000 mg | ORAL_TABLET | Freq: Every day | ORAL | Status: DC
  Administered 2021-12-14: 25 mg via ORAL
  Filled 2021-12-13: qty 1

## 2021-12-13 MED ORDER — ISOSORBIDE MONONITRATE ER 30 MG PO TB24
30.0000 mg | ORAL_TABLET | Freq: Every day | ORAL | Status: DC
  Administered 2021-12-14: 30 mg via ORAL
  Filled 2021-12-13: qty 1

## 2021-12-13 MED ORDER — LABETALOL HCL 5 MG/ML IV SOLN
INTRAVENOUS | Status: AC
  Filled 2021-12-13: qty 4

## 2021-12-13 MED ORDER — CLOPIDOGREL BISULFATE 300 MG PO TABS
ORAL_TABLET | ORAL | Status: AC
  Filled 2021-12-13: qty 2

## 2021-12-13 MED ORDER — NITROGLYCERIN 0.4 MG SL SUBL
SUBLINGUAL_TABLET | SUBLINGUAL | Status: DC | PRN
  Administered 2021-12-13: .4 mg via SUBLINGUAL

## 2021-12-13 MED ORDER — FAMOTIDINE IN NACL 20-0.9 MG/50ML-% IV SOLN
INTRAVENOUS | Status: AC | PRN
Start: 2021-12-13 — End: 2021-12-13
  Administered 2021-12-13: 20 mg via INTRAVENOUS

## 2021-12-13 MED ORDER — VERAPAMIL HCL 2.5 MG/ML IV SOLN
INTRAVENOUS | Status: AC
  Filled 2021-12-13: qty 2

## 2021-12-13 SURGICAL SUPPLY — 16 items
BALLN SAPPHIRE 2.0X12 (BALLOONS) ×2
BALLOON SAPPHIRE 2.0X12 (BALLOONS) IMPLANT
CATH LAUNCHER 6FR JR4 (CATHETERS) ×1 IMPLANT
CATH OPTITORQUE TIG 4.0 5F (CATHETERS) ×1 IMPLANT
DEVICE RAD COMP TR BAND LRG (VASCULAR PRODUCTS) ×1 IMPLANT
GLIDESHEATH SLEND SS 6F .021 (SHEATH) ×1 IMPLANT
GUIDEWIRE INQWIRE 1.5J.035X260 (WIRE) IMPLANT
INQWIRE 1.5J .035X260CM (WIRE) ×2
KIT ENCORE 26 ADVANTAGE (KITS) ×1 IMPLANT
KIT HEART LEFT (KITS) ×2 IMPLANT
PACK CARDIAC CATHETERIZATION (CUSTOM PROCEDURE TRAY) ×2 IMPLANT
SHEATH PROBE COVER 6X72 (BAG) ×1 IMPLANT
STENT ONYX FRONTIER 3.5X30 (Permanent Stent) ×1 IMPLANT
TRANSDUCER W/STOPCOCK (MISCELLANEOUS) ×2 IMPLANT
TUBING CIL FLEX 10 FLL-RA (TUBING) ×2 IMPLANT
WIRE ASAHI PROWATER 180CM (WIRE) ×1 IMPLANT

## 2021-12-13 NOTE — Progress Notes (Signed)
He  just had a cardiac cath in hospital and DES stent placed .  Please order future bmp , intact PTH  dx elevated calcium.  Can be done  in a month to recheck calcium level ( may not be clinically significant but will need fu ) Let him know  about follow up.

## 2021-12-13 NOTE — Plan of Care (Signed)

## 2021-12-13 NOTE — Progress Notes (Signed)
Supervisor Macon Large spoke with Infection prevention Cheral Marker.  Due to patient being positive for Covid on 09/20/21, they are within the 90-day window and do not need to be retested this admission. tdh

## 2021-12-13 NOTE — Interval H&P Note (Signed)
History and Physical Interval Note:  12/13/2021 9:03 AM  Bryan Cabrera  has presented today for surgery, with the diagnosis of unstable angina.  The various methods of treatment have been discussed with the patient and family. After consideration of risks, benefits and other options for treatment, the patient has consented to  Procedure(s): LEFT HEART CATH AND CORONARY ANGIOGRAPHY (N/A)  PERCUTANEOUS CORONARY INTERVENTION   as a surgical intervention.  The patient's history has been reviewed, patient examined, no change in status, stable for surgery.  I have reviewed the patient's chart and labs.  Questions were answered to the patient's satisfaction.    Cath Lab Visit (complete for each Cath Lab visit)  Clinical Evaluation Leading to the Procedure:   ACS: No.  Non-ACS:    Anginal Classification: CCS IV  Anti-ischemic medical therapy: Maximal Therapy (2 or more classes of medications)  Non-Invasive Test Results: Equivocal test results -nonischemic Myoview, but ongoing pain  Prior CABG: No previous CABG         [name]

## 2021-12-13 NOTE — Progress Notes (Signed)
Pt is irritable, speaking loudly on cell phone to wife complaining that his nurses did a "half-ass" skin assessment because his "front" was not assessed. Pt's skin assessment wnl per flow. Pt also c/o of not being "shaved" correctly for cath procedure, c/o of not being handed ginger ale when ginger ale was on bedside tray next to bed.Pt is a&o x4, walks independently in room. Explained to patient that pt can call for help if needed, and he has not called out, therefore, RN was unaware. Provided teaching regarding using call bell and purpose of skin assessment. Will continue to monitor.

## 2021-12-13 NOTE — Addendum Note (Signed)
Addended by: Nilda Riggs on: 12/13/2021 12:16 PM   Modules accepted: Orders

## 2021-12-14 ENCOUNTER — Other Ambulatory Visit (HOSPITAL_COMMUNITY): Payer: Self-pay

## 2021-12-14 DIAGNOSIS — Z8249 Family history of ischemic heart disease and other diseases of the circulatory system: Secondary | ICD-10-CM | POA: Diagnosis not present

## 2021-12-14 DIAGNOSIS — I4819 Other persistent atrial fibrillation: Secondary | ICD-10-CM | POA: Diagnosis not present

## 2021-12-14 DIAGNOSIS — Z20822 Contact with and (suspected) exposure to covid-19: Secondary | ICD-10-CM | POA: Diagnosis not present

## 2021-12-14 DIAGNOSIS — I209 Angina pectoris, unspecified: Secondary | ICD-10-CM

## 2021-12-14 DIAGNOSIS — I10 Essential (primary) hypertension: Secondary | ICD-10-CM | POA: Diagnosis not present

## 2021-12-14 DIAGNOSIS — E785 Hyperlipidemia, unspecified: Secondary | ICD-10-CM

## 2021-12-14 DIAGNOSIS — Z8616 Personal history of COVID-19: Secondary | ICD-10-CM | POA: Diagnosis not present

## 2021-12-14 DIAGNOSIS — I25119 Atherosclerotic heart disease of native coronary artery with unspecified angina pectoris: Secondary | ICD-10-CM | POA: Diagnosis not present

## 2021-12-14 DIAGNOSIS — E782 Mixed hyperlipidemia: Secondary | ICD-10-CM

## 2021-12-14 DIAGNOSIS — I2582 Chronic total occlusion of coronary artery: Secondary | ICD-10-CM | POA: Diagnosis not present

## 2021-12-14 LAB — BASIC METABOLIC PANEL
Anion gap: 8 (ref 5–15)
BUN: 12 mg/dL (ref 8–23)
CO2: 23 mmol/L (ref 22–32)
Calcium: 9.3 mg/dL (ref 8.9–10.3)
Chloride: 108 mmol/L (ref 98–111)
Creatinine, Ser: 1.02 mg/dL (ref 0.61–1.24)
GFR, Estimated: >60 mL/min (ref >60)
Glucose, Bld: 102 mg/dL — ABNORMAL HIGH (ref 70–99)
Potassium: 3.3 mmol/L — ABNORMAL LOW (ref 3.5–5.1)
Sodium: 139 mmol/L (ref 135–145)

## 2021-12-14 LAB — CBC
HCT: 40.2 % (ref 39.0–52.0)
Hemoglobin: 13.8 g/dL (ref 13.0–17.0)
MCH: 29 pg (ref 26.0–34.0)
MCHC: 34.3 g/dL (ref 30.0–36.0)
MCV: 84.5 fL (ref 80.0–100.0)
Platelets: 211 10*3/uL (ref 150–400)
RBC: 4.76 MIL/uL (ref 4.22–5.81)
RDW: 13.4 % (ref 11.5–15.5)
WBC: 8 10*3/uL (ref 4.0–10.5)
nRBC: 0 % (ref 0.0–0.2)

## 2021-12-14 LAB — POCT ACTIVATED CLOTTING TIME: Activated Clotting Time: 299 seconds

## 2021-12-14 MED ORDER — ROSUVASTATIN CALCIUM 20 MG PO TABS
40.0000 mg | ORAL_TABLET | Freq: Every day | ORAL | Status: DC
Start: 2021-12-14 — End: 2021-12-14

## 2021-12-14 MED ORDER — CLOPIDOGREL BISULFATE 75 MG PO TABS
75.0000 mg | ORAL_TABLET | Freq: Every day | ORAL | 5 refills | Status: DC
  Filled 2021-12-14: qty 30, 30d supply, fill #0

## 2021-12-14 MED ORDER — POTASSIUM CHLORIDE CRYS ER 20 MEQ PO TBCR
60.0000 meq | EXTENDED_RELEASE_TABLET | Freq: Once | ORAL | Status: AC
  Administered 2021-12-14: 60 meq via ORAL
  Filled 2021-12-14: qty 3

## 2021-12-14 MED ORDER — NITROGLYCERIN 0.4 MG SL SUBL
0.4000 mg | SUBLINGUAL_TABLET | SUBLINGUAL | 2 refills | Status: DC | PRN
  Filled 2021-12-14: qty 25, 7d supply, fill #0

## 2021-12-14 MED ORDER — ROSUVASTATIN CALCIUM 40 MG PO TABS
40.0000 mg | ORAL_TABLET | Freq: Every day | ORAL | 2 refills | Status: DC
  Filled 2021-12-14: qty 30, 30d supply, fill #0

## 2021-12-14 MED FILL — Nitroglycerin IV Soln 100 MCG/ML in D5W: INTRA_ARTERIAL | Qty: 10 | Status: AC

## 2021-12-14 NOTE — Discharge Summary (Addendum)
Discharge Summary    Patient ID: Bryan Cabrera MRN: [id number]; DOB: 04/25/1941  Admit date: 12/13/2021 Discharge date: 12/14/2021  PCP:  [name], MD   [facility] Providers Cardiologist:  [name], MD  Electrophysiologist:  [name], MD  {   Discharge Diagnoses    Principal Problem:   Angina, class IV [facility]) Active Problems:   Coronary artery disease involving native coronary artery of native heart with unstable angina pectoris ([place])   Persistent atrial fibrillation ([place])   Hypertension   Hyperlipidemia    Diagnostic Studies/Procedures    Left Cardiac Catheterization 12/13/2021:   CULPRIT LESION SEGMENT: Prox RCA lesion is 50% stenosed.  Prox RCA to Mid RCA lesion is 99% stenosed.  TIMI 0 flow noted in the PDA   Prox RCA to Mid RCA lesion is 99% stenosed.   A drug-eluting stent was successfully placed using a STENT ONYX FRONTIER 3.5X30-deployed to 3.8 mm.   Post intervention, there is a 0% residual stenosis throughout after postdilation, TIMI-3 flow restored from TIMI 0..   -------------------------------   Otherwise angiographically normal Left Coronary System.   LV end diastolic pressure is normal.   There is no aortic valve stenosis.   Summary: Severe single-vessel CAD with tapered to 50% down to 99% subtotal occlusion of proximal to mid RCA with TIMI 0 flow down the PDA Successful DES PCI of the proximal to mid RCA 50-99% stenoses using Onyx frontier DES 3.5 mm x 30 mm deployed to 3.8 mm.- Reduced to 0% stenosis, TIMI-3 flow restored. Otherwise angiographically minimal CAD in the Left Coronary System. Known preserved EF by stress test, normal LVEDP     Recommendations: Due to advanced age, and infirm wife at home, safer to monitor overnight as outpatient with extended recovery.  Plan discharge in the morning. Continue cardiovascular risk factor modification. Anticoagulation/antiplatelet agents per Recommendations  section  Diagnostic Dominance: Right  Intervention    _____________   History of Present Illness     Bryan Cabrera is a 81 y.o. male with a history of recent chest pain, persistent atrial fibrillation not currently on anticoagulation, hypertension hyperlipidemia, short of sleep apnea, and vertigo who is followed by Dr. [name].  Patient has a history of persistent atrial fibrillation.  He was previously on Eliquis but this was stopped after a fall in 11/2020 resulting in a large hip hematoma.  He was seen by Dr. Quentin Ore in 10/2021 and it was felt that he would benefit from the [facility] procedure.  However, patient wanted more time to think about this.  He was recently admitted in 11/2021 for chest pain.  High-sensitivity troponin was minimally elevated at 4 >> 4>> 14 >> 24.  G showed no ischemic changes.  He was felt to be stable for discharge with plans for outpatient Myoview.  Myoview on 11/30/2021 showed no evidence of ischemia.  He was recently seen by Dr. [name] on 12/08/2021 at which time he continued to report chest pain with minimal exertion.  Therefore, outpatient cardiac catheterization was arranged for further evaluation.  Hospital Course     Consultants: None  CAD Chest Pain Patient presented to [facility] on 12/13/2021 for planned outpatient cardiac catheterization.  Cath showed 80% stenosis of proximal RCA followed by 99% stenosis of proximal to mid RCA with otherwise normal coronaries.  Patient underwent successful PCI with DES to RCA lesion.  Patient tolerated the procedure well but due to advanced age was monitored overnight. He did well overnight with no  recurrent chest pain. Dr. Ellyn Hack recommended starting DAPT with Aspirin and Plavix for 1 month at which time Aspirin can be stopped and Eliquis 5 mg twice daily can be restarted given history of atrial fibrillation.  Plavix should be continued for 6 months.  However, if patient decides to have Watchman procedure and DOAC is  discontinued following this, recommendation is to continue Plavix for 1 year post PCI. Continue home beta-blocker and Imdur. Will start high-intensity statin.   Persistent Atrial Fibrillation Patient has a long history of persistent atrial fibrillation.  He was previously on Eliquis but this was stopped after a fall in 11/2020 due to a large hip hematoma.  He was recently seen by Dr. Quentin Ore and Watchman procedure was recommended; however, patient wanted to think about this some more.  During last visit with Dr. [name], patient reported 2 separate episodes where he felt like he was slurring his words slightly lasting less than a minute.  Given these findings, it was recommended that patient get back on anticoagulation as soon as possible.  Plan is to tentatively restart Eliquis on 01/10/2022 after he has completed 1 month of DAPT following PCI as stated above. Eliquis can be prescribed at follow-up visit assuming no bleeding issues.  Hypertension Continue home medications: HCTZ 25 mg daily, olmesartan 40 mg daily, Lopressor Evan and 1/2 mg twice daily, and Imdur 30 mg daily.  Hyperlipidemia Patient has hypertriglyceridemia listed in his chart but is not on any medications.  Direct LDL 99 in 01/2021.  We will start Crestor 40 mg daily.  Will need repeat lipid panel and LFTs in 6 to 8 weeks.  Hypokalemia Potassium 3.3 on day of discharge. Will supplement with potassium chloride 60 mEq prior to discharge and then continue home 10 mEq daily at discharge. Recommend repeat BMET at follow-up visit.  Patient was seen and examined today by Dr. [name] and determined to be stable for discharge.  Outpatient follow-up has been arranged.  Medications as below.  Did the patient have an acute coronary syndrome (MI, NSTEMI, STEMI, etc) this admission?:  No                               Did the patient have a percutaneous coronary intervention (stent / angioplasty)?:  Yes.     Cath/PCI Registry Performance & Quality  Measures: Aspirin prescribed? - Yes ADP Receptor Inhibitor (Plavix/Clopidogrel, Brilinta/Ticagrelor or Effient/Prasugrel) prescribed (includes medically managed patients)? - Yes High Intensity Statin (Lipitor 40-80mg  or Crestor 20-40mg ) prescribed? - Yes For EF <40%, was ACEI/ARB prescribed? - Not Applicable (EF >/= 67%) For EF <40%, Aldosterone Antagonist (Spironolactone or Eplerenone) prescribed? - Not Applicable (EF >/= 20%) Cardiac Rehab Phase II ordered? - Yes   _____________  Discharge Vitals Blood pressure (!) 146/77, pulse 79, temperature 98.1 F (36.7 C), temperature source Oral, resp. rate 16, height 5' 8.5" (1.74 m), weight 92.5 kg, SpO2 98 %.  Filed Weights   12/13/21 0734  Weight: 92.5 kg   General: 81 y.o. Caucasian male resting comfortably in no acute distress. HEENT: Normocephalic and atraumatic.  Neck: Supple.  No JVD. Heart: Irregularly irregular rhythm with normal rate. Distinct S1 and S2. No murmurs, gallops, or rubs. Right radial cath site soft with no signs of hematoma. Lungs: No increased work of breathing. Clear to ausculation bilaterally. No wheezes, rhonchi, or rales.  Abdomen: Soft, non-distended, and non-tender to palpation.  Extremities: No lower extremity edema.  Skin: Warm and dry. Neuro: Alert and oriented x3. No focal deficits. Psych: Normal affect. Responds appropriately.  Labs & Radiologic Studies    CBC Recent Labs    12/14/21 0232  WBC 8.0  HGB 13.8  HCT 40.2  MCV 84.5  PLT 812   Basic Metabolic Panel Recent Labs    12/14/21 0232  NA 139  K 3.3*  CL 108  CO2 23  GLUCOSE 102*  BUN 12  CREATININE 1.02  CALCIUM 9.3   Liver Function Tests No results for input(s): AST, ALT, ALKPHOS, BILITOT, PROT, ALBUMIN in the last 72 hours. No results for input(s): LIPASE, AMYLASE in the last 72 hours. High Sensitivity Troponin:   Recent Labs  Lab 11/24/21 1945 11/24/21 2137  TROPONINIHS 14 24*    BNP Invalid input(s):  POCBNP D-Dimer No results for input(s): DDIMER in the last 72 hours. Hemoglobin A1C No results for input(s): HGBA1C in the last 72 hours. Fasting Lipid Panel No results for input(s): CHOL, HDL, LDLCALC, TRIG, CHOLHDL, LDLDIRECT in the last 72 hours. Thyroid Function Tests No results for input(s): TSH, T4TOTAL, T3FREE, THYROIDAB in the last 72 hours.  Invalid input(s): FREET3 _____________  DG Chest 2 View  Result Date: 11/24/2021 CLINICAL DATA:  Chest pain EXAM: CHEST - 2 VIEW COMPARISON:  10/04/2021 FINDINGS: Numerous leads and wires project over the chest. Midline trachea. Normal heart size and mediastinal contours. No pleural effusion or pneumothorax. Clear lungs. IMPRESSION: No active cardiopulmonary disease. Electronically Signed   By: Abigail Miyamoto M.D.   On: 11/24/2021 19:39   CARDIAC CATHETERIZATION  Result Date: 12/13/2021   CULPRIT LESION SEGMENT: Prox RCA lesion is 50% stenosed.  Prox RCA to Mid RCA lesion is 99% stenosed.  TIMI 0 flow noted in the PDA   Prox RCA to Mid RCA lesion is 99% stenosed.   A drug-eluting stent was successfully placed using a STENT ONYX FRONTIER 3.5X30-deployed to 3.8 mm.   Post intervention, there is a 0% residual stenosis throughout after postdilation, TIMI-3 flow restored from TIMI 0..   -------------------------------   Otherwise angiographically normal Left Coronary System.   LV end diastolic pressure is normal.   There is no aortic valve stenosis. SUMMARY Severe single-vessel CAD with tapered to 50% down to 99% subtotal occlusion of proximal to mid RCA with TIMI 0 flow down the PDA Successful DES PCI of the proximal to mid RCA 50-99% stenoses using Onyx frontier DES 3.5 mm x 30 mm deployed to 3.8 mm.- Reduced to 0% stenosis, TIMI-3 flow restored. Otherwise angiographically minimal CAD in the Left Coronary System. Known preserved EF by stress test, normal LVEDP RECOMMENDATIONS Due to advanced age, and infirm wife at home, safer to monitor overnight as  outpatient with extended recovery.  Plan discharge in the morning. Continue cardiovascular risk factor modification. Anticoagulation/antiplatelet agents per Recommendations section [name], MD  MYOCARDIAL PERFUSION IMAGING  Result Date: 11/30/2021 Normal resting and stress perfusion. No ischemia or infarction EF 68%   Disposition   Patient is being discharged home today in good condition.  Follow-up Plans & Appointments     Follow-up Information     [name], MD Follow up.   Specialty: Cardiology Why: Please keep follow-up with Dr. [name] scheduled for 12/28/2021 at 10:00am. Contact information: [address] [address] [phone]                Discharge Instructions     Diet - low sodium heart healthy  Complete by: As directed    Increase activity slowly   Complete by: As directed        Discharge Medications   Allergies as of 12/14/2021       Reactions   Ciprofloxacin Other (See Comments)   Nausea and dizziness   Metronidazole Other (See Comments)   Nausea and dizziness   Penicillins Other (See Comments)   REACTION: has family hx ? If ever had a reaction Has patient had a PCN reaction causing immediate rash, facial/tongue/throat swelling, SOB or lightheadedness with hypotension: No Has patient had a PCN reaction causing severe rash involving mucus membranes or skin necrosis: No Has patient had a PCN reaction that required hospitalization: No Has patient had a PCN reaction occurring within the last 10 years: No If all of the above answers are "NO", then may proceed with Cephalosporin use.   Quinapril Hcl Cough   Tetracycline Hcl Nausea And Vomiting, Other (See Comments)   Vomiting with illness   Amoxicillin Other (See Comments)   REACTION: has family hx ? If ever had a reaction Has patient had a PCN reaction causing immediate rash, facial/tongue/throat swelling, SOB or lightheadedness with hypotension: No Has  patient had a PCN reaction causing severe rash involving mucus membranes or skin necrosis: No Has patient had a PCN reaction that required hospitalization: No Has patient had a PCN reaction occurring within the last 10 years: No If all of the above answers are "NO", then may proceed with Cephalosporin use.        Medication List     TAKE these medications    aspirin EC 81 MG tablet Take 1 tablet (81 mg total) by mouth daily with breakfast.   clopidogrel 75 MG tablet Commonly known as: PLAVIX Take 1 tablet (75 mg total) by mouth daily with breakfast.   hydrochlorothiazide 25 MG tablet Commonly known as: HYDRODIURIL Take 1 tablet (25 mg total) by mouth daily.   isosorbide mononitrate 30 MG 24 hr tablet Commonly known as: IMDUR Take 1 tablet (30 mg total) by mouth daily.   latanoprost 0.005 % ophthalmic solution Commonly known as: XALATAN Place 1 drop into both eyes at bedtime.   metoprolol tartrate 25 MG tablet Commonly known as: LOPRESSOR Take 1 tablet (25 mg total) by mouth See admin instructions. Take one and one-half tablets by mouth 2 times daily   nitroGLYCERIN 0.4 MG SL tablet Commonly known as: Nitrostat Place 1 tablet (0.4 mg total) under the tongue every 5 (five) minutes as needed for chest pain.   olmesartan 40 MG tablet Commonly known as: BENICAR Take 1 tablet (40 mg total) by mouth daily.   ondansetron 8 MG tablet Commonly known as: Zofran Take 1 tablet (8 mg total) by mouth every 6 (six) hours as needed for nausea or vomiting.   potassium chloride 10 MEQ tablet Commonly known as: KLOR-CON Take 1 tablet (10 mEq total) by mouth daily. Take While taking HCTZ/hydrochlorothiazide   rosuvastatin 40 MG tablet Commonly known as: CRESTOR Take 1 tablet (40 mg total) by mouth daily.           Outstanding Labs/Studies   Repeat BMET at follow-up visit. Repeat lipid panel and LFTs in 6-8 weeks.  Duration of Discharge Encounter   Greater than 30  minutes including physician time.  Signed, Darreld Mclean, [name] 12/14/2021, 9:08 AM   Personally seen and examined. Agree with above.  Mid RCA 99% stenosis-successful 3.5 x 30 mm stent. Feeling well this morning.  No  adverse arrhythmias.  Occasional PVCs noted on telemetry personally reviewed.  Cardiac catheterization site intact.  Angina/CAD - Plan is for dual antiplatelet therapy.  Appreciate Dr. Allison Quarry review/strategy for treatment plan. -I agree with above that we will obviously continue with aspirin and Plavix for 1 month.  At that time aspirin will be stopped and Eliquis 5 mg twice a day will be restarted.  He will be on Eliquis and Plavix at that point.  Watch for any signs of bleeding. - At 1 year, his Plavix can be discontinued and he will continue with his Eliquis. -If he decides to have Watchman procedure, we should wait 3-6 months.   Hypertension - Continue with home meds.  Reviewed.  Hyperlipidemia - LDL 99 however we will start Crestor 40 mg.  Repeat lipid panel as outpatient.  Hypokalemia - Potassium supplementation.  He has close outpatient follow-up.

## 2021-12-14 NOTE — Discharge Instructions (Signed)
Post Cardiac Catheterization: NO HEAVY LIFTING OR SEXUAL ACTIVITY X 7 DAYS. NO DRIVING X 3-5 DAYS. NO SOAKING BATHS, HOT TUBS, POOLS, ETC., X 7 DAYS.  Radial Site Care: Refer to this sheet in the next few weeks. These instructions provide you with information on caring for yourself after your procedure. Your caregiver may also give you more specific instructions. Your treatment has been planned according to current medical practices, but problems sometimes occur. Call your caregiver if you have any problems or questions after your procedure. HOME CARE INSTRUCTIONS  You may shower the day after the procedure.Remove the bandage (dressing) and gently wash the site with plain soap and water.Gently pat the site dry.   Do not apply powder or lotion to the site.   Do not submerge the affected site in water for 3 to 5 days.   Inspect the site at least twice daily.   Do not flex or bend the affected arm for 24 hours.   No lifting over 5 pounds (2.3 kg) for 5 days after your procedure.   Do not drive home if you are discharged the same day of the procedure. Have someone else drive you.  What to expect:  Any bruising will usually fade within 1 to 2 weeks.   Blood that collects in the tissue (hematoma) may be painful to the touch. It should usually decrease in size and tenderness within 1 to 2 weeks.  SEEK IMMEDIATE MEDICAL CARE IF:  You have unusual pain at the radial site.   You have redness, warmth, swelling, or pain at the radial site.   You have drainage (other than a small amount of blood on the dressing).   You have chills.   You have a fever or persistent symptoms for more than 72 hours.   You have a fever and your symptoms suddenly get worse.   Your arm becomes pale, cool, tingly, or numb.   You have heavy bleeding from the site. Hold pressure on the site.    

## 2021-12-14 NOTE — Progress Notes (Signed)
Pt safely discharged. Discharge packet provided with teach-back method. VS wnL and as per flow. IVs removed, Pt verbalized understanding. All questions and concerns addressed. Awaiting on son for transport.  ?

## 2021-12-14 NOTE — Progress Notes (Signed)
CARDIAC REHAB PHASE I  ? ?PRE:  Rate/Rhythm: 82 Afib with PVCs ? ?BP:  Sitting: 155/71     ? ?SaO2: 98 RA ? ?MODE:  Ambulation: 400 ft  ? ?POST:  Rate/Rhythm: 121 Afib with PVCs ? ?BP:  Sitting: 174/91 ? ?  SaO2: 97 RA ? ?Pt ambulated 461ft standby assist with front wheel walker. Pt denies CP, SOB, or dizziness. Pt educated on importance of ASA and Plavix. Pt given heart healthy diet. Reviewed site care, restrictions, and exercise guidelines. Will refer to CRP II [place]. ? ?[phone] ?Rufina Falco, RN BSN ?12/14/2021 ?9:38 AM ? ?

## 2021-12-19 ENCOUNTER — Telehealth: Payer: Self-pay | Admitting: Cardiology

## 2021-12-19 ENCOUNTER — Other Ambulatory Visit: Payer: Self-pay

## 2021-12-19 ENCOUNTER — Observation Stay (HOSPITAL_COMMUNITY)
Admission: EM | Admit: 2021-12-19 | Discharge: 2021-12-20 | Disposition: A | Discharge status: Home / Self Care | Payer: Medicare PPO | Attending: Internal Medicine | Admitting: Internal Medicine

## 2021-12-19 ENCOUNTER — Emergency Department (HOSPITAL_COMMUNITY): Payer: Medicare PPO

## 2021-12-19 ENCOUNTER — Encounter (HOSPITAL_COMMUNITY): Payer: Self-pay | Admitting: *Deleted

## 2021-12-19 ENCOUNTER — Observation Stay (HOSPITAL_COMMUNITY): Payer: Medicare PPO

## 2021-12-19 DIAGNOSIS — Z955 Presence of coronary angioplasty implant and graft: Secondary | ICD-10-CM | POA: Diagnosis not present

## 2021-12-19 DIAGNOSIS — R471 Dysarthria and anarthria: Secondary | ICD-10-CM

## 2021-12-19 DIAGNOSIS — Z87891 Personal history of nicotine dependence: Secondary | ICD-10-CM | POA: Insufficient documentation

## 2021-12-19 DIAGNOSIS — Z20822 Contact with and (suspected) exposure to covid-19: Secondary | ICD-10-CM | POA: Diagnosis not present

## 2021-12-19 DIAGNOSIS — I10 Essential (primary) hypertension: Secondary | ICD-10-CM | POA: Insufficient documentation

## 2021-12-19 DIAGNOSIS — G459 Transient cerebral ischemic attack, unspecified: Principal | ICD-10-CM | POA: Diagnosis present

## 2021-12-19 DIAGNOSIS — I4821 Permanent atrial fibrillation: Secondary | ICD-10-CM | POA: Diagnosis not present

## 2021-12-19 DIAGNOSIS — M47812 Spondylosis without myelopathy or radiculopathy, cervical region: Secondary | ICD-10-CM | POA: Diagnosis not present

## 2021-12-19 DIAGNOSIS — I639 Cerebral infarction, unspecified: Secondary | ICD-10-CM | POA: Diagnosis not present

## 2021-12-19 DIAGNOSIS — Z79899 Other long term (current) drug therapy: Secondary | ICD-10-CM | POA: Diagnosis not present

## 2021-12-19 DIAGNOSIS — R4781 Slurred speech: Secondary | ICD-10-CM | POA: Diagnosis not present

## 2021-12-19 DIAGNOSIS — I25110 Atherosclerotic heart disease of native coronary artery with unstable angina pectoris: Secondary | ICD-10-CM | POA: Diagnosis not present

## 2021-12-19 DIAGNOSIS — Z7982 Long term (current) use of aspirin: Secondary | ICD-10-CM | POA: Diagnosis not present

## 2021-12-19 DIAGNOSIS — R42 Dizziness and giddiness: Secondary | ICD-10-CM | POA: Diagnosis not present

## 2021-12-19 DIAGNOSIS — R000 Tachycardia, unspecified: Secondary | ICD-10-CM | POA: Diagnosis not present

## 2021-12-19 LAB — COMPREHENSIVE METABOLIC PANEL
ALT: 32 U/L (ref 0–44)
AST: 23 U/L (ref 15–41)
Albumin: 4.5 g/dL (ref 3.5–5.0)
Alkaline Phosphatase: 66 U/L (ref 38–126)
Anion gap: 11 (ref 5–15)
BUN: 27 mg/dL — ABNORMAL HIGH (ref 8–23)
CO2: 25 mmol/L (ref 22–32)
Calcium: 10.2 mg/dL (ref 8.9–10.3)
Chloride: 103 mmol/L (ref 98–111)
Creatinine, Ser: 1.3 mg/dL — ABNORMAL HIGH (ref 0.61–1.24)
GFR, Estimated: 55 mL/min — ABNORMAL LOW (ref >60)
Glucose, Bld: 104 mg/dL — ABNORMAL HIGH (ref 70–99)
Potassium: 3.9 mmol/L (ref 3.5–5.1)
Sodium: 139 mmol/L (ref 135–145)
Total Bilirubin: 0.8 mg/dL (ref 0.3–1.2)
Total Protein: 8.1 g/dL (ref 6.5–8.1)

## 2021-12-19 LAB — RAPID URINE DRUG SCREEN, HOSP PERFORMED
Amphetamines: NOT DETECTED
Barbiturates: NOT DETECTED
Benzodiazepines: NOT DETECTED
Cocaine: NOT DETECTED
Opiates: NOT DETECTED
Tetrahydrocannabinol: NOT DETECTED

## 2021-12-19 LAB — URINALYSIS, ROUTINE W REFLEX MICROSCOPIC
Bilirubin Urine: NEGATIVE
Glucose, UA: NEGATIVE mg/dL
Hgb urine dipstick: NEGATIVE
Ketones, ur: NEGATIVE mg/dL
Leukocytes,Ua: NEGATIVE
Nitrite: NEGATIVE
Protein, ur: NEGATIVE mg/dL
Specific Gravity, Urine: 1.019 (ref 1.005–1.030)
pH: 5 (ref 5.0–8.0)

## 2021-12-19 LAB — APTT: aPTT: 28 seconds (ref 24–36)

## 2021-12-19 LAB — DIFFERENTIAL
Abs Immature Granulocytes: 0.03 10*3/uL (ref 0.00–0.07)
Basophils Absolute: 0.1 10*3/uL (ref 0.0–0.1)
Basophils Relative: 1 %
Eosinophils Absolute: 0.2 10*3/uL (ref 0.0–0.5)
Eosinophils Relative: 3 %
Immature Granulocytes: 0 %
Lymphocytes Relative: 27 %
Lymphs Abs: 2 10*3/uL (ref 0.7–4.0)
Monocytes Absolute: 0.9 10*3/uL (ref 0.1–1.0)
Monocytes Relative: 12 %
Neutro Abs: 4.4 10*3/uL (ref 1.7–7.7)
Neutrophils Relative %: 57 %

## 2021-12-19 LAB — PROTIME-INR
INR: 1 (ref 0.8–1.2)
Prothrombin Time: 12.9 seconds (ref 11.4–15.2)

## 2021-12-19 LAB — I-STAT CHEM 8, ED
BUN: 27 mg/dL — ABNORMAL HIGH (ref 8–23)
Calcium, Ion: 1.35 mmol/L (ref 1.15–1.40)
Chloride: 104 mmol/L (ref 98–111)
Creatinine, Ser: 1.4 mg/dL — ABNORMAL HIGH (ref 0.61–1.24)
Glucose, Bld: 104 mg/dL — ABNORMAL HIGH (ref 70–99)
HCT: 49 % (ref 39.0–52.0)
Hemoglobin: 16.7 g/dL (ref 13.0–17.0)
Potassium: 4 mmol/L (ref 3.5–5.1)
Sodium: 140 mmol/L (ref 135–145)
TCO2: 27 mmol/L (ref 22–32)

## 2021-12-19 LAB — CBC
HCT: 48.2 % (ref 39.0–52.0)
Hemoglobin: 16 g/dL (ref 13.0–17.0)
MCH: 28.2 pg (ref 26.0–34.0)
MCHC: 33.2 g/dL (ref 30.0–36.0)
MCV: 85 fL (ref 80.0–100.0)
Platelets: 272 10*3/uL (ref 150–400)
RBC: 5.67 MIL/uL (ref 4.22–5.81)
RDW: 13.3 % (ref 11.5–15.5)
WBC: 7.6 10*3/uL (ref 4.0–10.5)
nRBC: 0 % (ref 0.0–0.2)

## 2021-12-19 LAB — RESP PANEL BY RT-PCR (FLU A&B, COVID) ARPGX2
Influenza A by PCR: NEGATIVE
Influenza B by PCR: NEGATIVE
SARS Coronavirus 2 by RT PCR: NEGATIVE

## 2021-12-19 LAB — ETHANOL: Alcohol, Ethyl (B): <10 mg/dL (ref <10)

## 2021-12-19 MED ORDER — STROKE: EARLY STAGES OF RECOVERY BOOK
Freq: Once | Status: AC
  Administered 2021-12-19: 1

## 2021-12-19 MED ORDER — ACETAMINOPHEN 160 MG/5ML PO SOLN: 650.0000 mg | ORAL | Status: DC | PRN

## 2021-12-19 MED ORDER — ACETAMINOPHEN 650 MG RE SUPP: 650.0000 mg | RECTAL | Status: DC | PRN

## 2021-12-19 MED ORDER — ACETAMINOPHEN 325 MG PO TABS: 650.0000 mg | ORAL_TABLET | ORAL | Status: DC | PRN

## 2021-12-19 MED ORDER — CLOPIDOGREL BISULFATE 75 MG PO TABS
75.0000 mg | ORAL_TABLET | Freq: Every day | ORAL | Status: DC
  Administered 2021-12-20: 75 mg via ORAL
  Filled 2021-12-19: qty 1

## 2021-12-19 MED ORDER — ENOXAPARIN SODIUM 40 MG/0.4ML IJ SOSY
40.0000 mg | PREFILLED_SYRINGE | INTRAMUSCULAR | Status: DC
  Administered 2021-12-19: 40 mg via SUBCUTANEOUS
  Filled 2021-12-19: qty 0.4

## 2021-12-19 MED ORDER — LATANOPROST 0.005 % OP SOLN
1.0000 [drp] | Freq: Every day | OPHTHALMIC | Status: DC
  Administered 2021-12-19: 1 [drp] via OPHTHALMIC
  Filled 2021-12-19: qty 2.5

## 2021-12-19 MED ORDER — ASPIRIN EC 81 MG PO TBEC
81.0000 mg | DELAYED_RELEASE_TABLET | Freq: Every day | ORAL | Status: DC
Start: 2021-12-20 — End: 2021-12-20
  Administered 2021-12-20: 81 mg via ORAL
  Filled 2021-12-19: qty 1

## 2021-12-19 MED ORDER — NITROGLYCERIN 0.4 MG SL SUBL: 0.4000 mg | SUBLINGUAL_TABLET | SUBLINGUAL | Status: DC | PRN

## 2021-12-19 MED ORDER — IOHEXOL 350 MG/ML SOLN
75.0000 mL | Freq: Once | INTRAVENOUS | Status: AC | PRN
  Administered 2021-12-19: 75 mL via INTRAVENOUS

## 2021-12-19 MED ORDER — SENNOSIDES-DOCUSATE SODIUM 8.6-50 MG PO TABS: 1.0000 | ORAL_TABLET | Freq: Every evening | ORAL | Status: DC | PRN

## 2021-12-19 NOTE — Telephone Encounter (Signed)
Pt was calling in to inform Dr. [name] that since his heart cath last Tuesday, he has been experiencing complaints of dizziness, difficulty ambulating and trouble speaking.   ?Pt voiced he is having complaints of dizziness, slurred speech, coordination issues, balance issues, confusion.  He voiced these episodes occur intermittently, with today being his worse day. ? ?Pt states he is having a difficult time speaking clearly and putting his thoughts together.  He states that today, his speech is very slurred and he's having extreme with ambulation.  He adds the dizziness has worsened today.  He voiced no complaints of sob, doe, or chest pain at this time. ? ? ?While speaking with the pt on the phone, could clearly hear him slurring his speech and having a difficult time forming his thoughts.  ? ?Asked the pt if he had someone with him to call 911.  Pt did have a family member present.  Advised them to call 911 now, and have him transported to [facility] ER for possible TIA/Stroke.  ? ?Pt was unsure exactly the specific time of onset of symptoms.  He just states ever since the heart cath last week, he has been having these issues intermittently, with today being his worse. ? ?Informed the pt that he should call 911 now.   ?Informed him I will make Dr. [name] and RN aware of this plan. ?Pt verbalized understanding and agrees with this plan.   ?

## 2021-12-19 NOTE — ED Provider Notes (Signed)
[facility] EMERGENCY DEPARTMENT Provider Note   CSN: [id number] Arrival date & time: 12/19/21  1738  An emergency department physician performed an initial assessment on this suspected stroke patient at 1739.  History  Chief Complaint  Patient presents with   Code Stroke    Bryan Cabrera is a 81 y.o. male.  HPI    81 year old male with history of CAD, persistent A-fib, hypertension, hyperlipidemia status post cardiac cath on 2-28 with PCI placement at that time comes in with chief complaint of slurred speech and balance issues.  Code stroke was activated on the field.  Patient indicates that he was feeling fine until he started having slurring in his speech and balance issues.  Last known normal is 2:30 PM.  He denies any one-sided weakness, numbness, vision change.  Neurology team noted that patient had called cardiology service after the cath was done with similar complaints.  Patient indicates that he has had intermittent episodes of dizziness, slurred speech since the procedure, but the symptoms usually last about 15 to 20 minutes.  This episode is more constant.  Home Medications Prior to Admission medications   Medication Sig Start Date End Date Taking? Authorizing Provider  aspirin EC 81 MG tablet Take 1 tablet (81 mg total) by mouth daily with breakfast. 11/25/21 11/25/22  Roxan Hockey, MD  clopidogrel (PLAVIX) 75 MG tablet Take 1 tablet (75 mg total) by mouth daily with breakfast. 12/14/21   Sande Rives E, [name]  hydrochlorothiazide (HYDRODIURIL) 25 MG tablet Take 1 tablet (25 mg total) by mouth daily. 12/06/21   [name], MD  isosorbide mononitrate (IMDUR) 30 MG 24 hr tablet Take 1 tablet (30 mg total) by mouth daily. 11/25/21 11/25/22  Roxan Hockey, MD  latanoprost (XALATAN) 0.005 % ophthalmic solution Place 1 drop into both eyes at bedtime. 08/09/18   Caren Macadam, MD  metoprolol tartrate (LOPRESSOR) 25 MG tablet Take 1 tablet (25 mg total) by mouth  See admin instructions. Take one and one-half tablets by mouth 2 times daily 11/25/21   Roxan Hockey, MD  nitroGLYCERIN (NITROSTAT) 0.4 MG SL tablet Place 1 tablet (0.4 mg total) under the tongue every 5 (five) minutes as needed for chest pain. 12/14/21 12/14/22  Sande Rives E, [name]  olmesartan (BENICAR) 40 MG tablet Take 1 tablet (40 mg total) by mouth daily. 12/06/21   [name], MD  ondansetron (ZOFRAN) 8 MG tablet Take 1 tablet (8 mg total) by mouth every 6 (six) hours as needed for nausea or vomiting. 11/04/21   Laurey Morale, MD  potassium chloride (KLOR-CON) 10 MEQ tablet Take 1 tablet (10 mEq total) by mouth daily. Take While taking HCTZ/hydrochlorothiazide 11/25/21   Roxan Hockey, MD  rosuvastatin (CRESTOR) 40 MG tablet Take 1 tablet (40 mg total) by mouth daily. 12/14/21   Sande Rives E, [name]      Allergies    Ciprofloxacin, Metronidazole, Penicillins, Quinapril hcl, Tetracycline hcl, and Amoxicillin    Review of Systems   Review of Systems  All other systems reviewed and are negative.  Physical Exam Updated Vital Signs BP (!) 119/94    Pulse 81    Temp 98.2 F (36.8 C) (Oral)    Resp (!) 21    Ht 5' 8.5" (1.74 m)    Wt 92.9 kg    SpO2 92%    BMI 30.70 kg/m  Physical Exam Vitals and nursing note reviewed.  Constitutional:      Appearance: He is well-developed.  HENT:     Head: Atraumatic.  Eyes:     Extraocular Movements: Extraocular movements intact.     Pupils: Pupils are equal, round, and reactive to light.     Comments: No nystagmus  Cardiovascular:     Rate and Rhythm: Normal rate.  Pulmonary:     Effort: Pulmonary effort is normal.  Musculoskeletal:     Cervical back: Neck supple.  Skin:    General: Skin is warm.  Neurological:     Mental Status: He is alert and oriented to person, place, and time.     Cranial Nerves: No cranial nerve deficit.     Sensory: No sensory deficit.     Motor: No weakness.     Coordination: Coordination normal.     ED Results / Procedures / Treatments   Labs (all labs ordered are listed, but only abnormal results are displayed) Labs Reviewed  COMPREHENSIVE METABOLIC PANEL - Abnormal; Notable for the following components:      Result Value   Glucose, Bld 104 (*)    BUN 27 (*)    Creatinine, Ser 1.30 (*)    GFR, Estimated 55 (*)    All other components within normal limits  I-STAT CHEM 8, ED - Abnormal; Notable for the following components:   BUN 27 (*)    Creatinine, Ser 1.40 (*)    Glucose, Bld 104 (*)    All other components within normal limits  RESP PANEL BY RT-PCR (FLU A&B, COVID) ARPGX2  ETHANOL  PROTIME-INR  APTT  CBC  DIFFERENTIAL  RAPID URINE DRUG SCREEN, HOSP PERFORMED  URINALYSIS, ROUTINE W REFLEX MICROSCOPIC    EKG EKG Interpretation  Date/Time:  Monday December 19 2021 17:56:26 EST Ventricular Rate:  101 PR Interval:    QRS Duration: 115 QT Interval:  403 QTC Calculation: 485 R Axis:   -34 Text Interpretation: Atrial fibrillation Nonspecific intraventricular conduction delay Inferior infarct, old No acute changes Confirmed by Varney Biles [phone]) on 12/19/2021 6:53:48 PM  Radiology CT HEAD CODE STROKE WO CONTRAST  Result Date: 12/19/2021 CLINICAL DATA:  Code stroke. Abnormal speech and gait starting at 2:30 p.m., dizziness, on Plavix EXAM: CT HEAD WITHOUT CONTRAST TECHNIQUE: Contiguous axial images were obtained from the base of the skull through the vertex without intravenous contrast. RADIATION DOSE REDUCTION: This exam was performed according to the departmental dose-optimization program which includes automated exposure control, adjustment of the mA and/or kV according to patient size and/or use of iterative reconstruction technique. COMPARISON:  CT head 09/20/2021, brain MRI 12/09/2019 FINDINGS: Brain: There is no evidence of acute intracranial hemorrhage, extra-axial fluid collection, or acute infarct. There is moderate global parenchymal volume loss with prominence  of the ventricular system and extra-axial CSF spaces. The ventricles are similar in size compared to the MRI from 2016. There is no significant burden of white matter microangiopathic change by CT. Gray-white differentiation is preserved. There is no mass lesion. There is no mass effect or midline shift. An empty sella is again noted. Vascular: No hyperdense vessel or unexpected calcification. Skull: Normal. Negative for fracture or focal lesion. Sinuses/Orbits: The paranasal sinuses are clear. Bilateral lens implants are in place. The globes and orbits are otherwise unremarkable. Other: None. ASPECTS [facility] Stroke Program Early CT Score) - Ganglionic level infarction (caudate, lentiform nuclei, internal capsule, insula, M1-M3 cortex): 7 - Supraganglionic infarction (M4-M6 cortex): 3 Total score (0-10 with 10 being normal): 10 IMPRESSION: 1. No acute intracranial pathology. 2. ASPECTS is 10 These results were called  by telephone at the time of interpretation on 12/19/2021 at 5:52 pm to provider [facility] , who verbally acknowledged these results. Electronically Signed   By: Valetta Mole M.D.   On: 12/19/2021 17:55    Procedures Procedures    Medications Ordered in ED Medications - No data to display  ED Course/ Medical Decision Making/ A&P Clinical Course as of 12/19/21 Merri Brunette Dec 19, 2021  1858 Neurology team has assessed the patient.  They recommend admission for TIA like symptoms.  Not a TNK candidate.  [AN]    Clinical Course User Index [AN] Varney Biles, MD                           Medical Decision Making Amount and/or Complexity of Data Reviewed Labs: ordered. Radiology: ordered.  Risk Decision regarding hospitalization.   81 year old male with history of CAD, hypertension, hyperlipidemia and persistent A-fib comes in with chief complaint of slurred speech and dizziness.  Code stroke was activated on the field.  Patient emergently assessed.  He is protecting his  airway.  He is noted to have mild slurring.  No dysmetria, no nystagmus and motor and sensory exam for the extremity is normal and reassuring.  It appears that he has been having waxing and waning symptoms ever since his procedure was completed sometime last week.  However, the symptoms currently are more constant.  Differential diagnosis includes embolic stroke, hemorrhagic stroke, ischemic stroke, electrolyte abnormality.  CT scan of the brain is not showing any evidence of active bleed. Anticipate admission.  Neurology recommendation pending.   Final Clinical Impression(s) / ED Diagnoses Final diagnoses:  Ischemic stroke [facility])    Rx / DC Orders ED Discharge Orders     None         Varney Biles, MD 12/19/21 1858

## 2021-12-19 NOTE — Telephone Encounter (Signed)
?  STAT if patient feels like he/she is going to faint  ? ?Are you dizzy now? yes ? ?Do you feel faint or have you passed out? no ? ?Do you have any other symptoms? Slurred speech ? ?Have you checked your HR and BP (record if available)? States it was near perfect ? ?Patient states he has not been doing well since his cath last Tuesday. He states he has been dizzy, unsteady on his feet, and slurring his words. Patient is having slurred speech on the phone. He states he took his BP around 2 pm and it was near perfect.  ?

## 2021-12-19 NOTE — ED Triage Notes (Addendum)
Pt brought in by [facility] from his work site with c/o abnormal speech and gait that started at 1430. EMS also reports pt c/o dizziness. BP 178/92 for EMS. Pt is on Plavix. Cardiac stent placed 1 week ago tomorrow at [facility]. EMS reports no arm or leg drift and grips equal bilaterally. Code Stroke called en route to the hospital by EMS. ?
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
## 2021-12-19 NOTE — Assessment & Plan Note (Addendum)
Slurred speech, dizziness, poor coordination.  Mild dysarthria noted in ED.  Sent back to baseline at the time of my evaluation.  History of atrial fibrillation, not on anticoagulation this was discontinued 11/2020.  He is currently on dual antiplatelet with aspirin and Plavix. ?-Code stroke called, head CT without acute abnormality, does not meet criteria for intervention, 2/2 mild symptoms. ?-Teleneurology recommends admission for TIA work-up to include MRI brain, CTA head and neck echo. ?-Swallow evaluation ?-PT, speech therapy evaluation ?- Hgba1c- 5.7 ?- Lipid panel ?-Continue aspirin and Plavix ?-He is not taking the statin that was prescribed, as the fine print listed onset of diabetes mellitus as a side effect. ?- Allow for permissive hypertension, use modified parameters given advanced age, treat for systolic greater than 147. ? ?
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
## 2021-12-19 NOTE — Progress Notes (Signed)
17:24-Code stroke activation- EMS pre alert 8 mins out ?17:28- Dr Cheral Marker paged to notify of potential code stroke ?17:38 Pt BIB EMS- Dr Kathrynn Humble at bedside ?17:41 Pt taken for CT ?17:44 Dr Cheral Marker on camera for stroke assessment ?17:51 Pt returned from CT ?

## 2021-12-19 NOTE — Assessment & Plan Note (Signed)
Currently in atrial fibrillation rate 101.  Anticoagulation was discontinued 11/2020 after patient fell and sustained a large hip hematoma.  Evaluated by cardiology, per notes 12/14/2021, plans for patient to resume anticoagulation 3/28 after completing 1 month of dual antiplatelet therapy. ?

## 2021-12-19 NOTE — Assessment & Plan Note (Addendum)
Stable.  ?-Allow for permissive hypertension, use modified parameters given advanced age, treat for systolic greater than 701. ?-Hold olmesartan, HCTZ, metoprolol and Imdur for now ?

## 2021-12-19 NOTE — Assessment & Plan Note (Addendum)
Admitted 2/28 3/1 under cardiology service.  Underwent left heart catheterization 12/13/2021, findings of severe single-vessel coronary artery disease.  Had successful DES of PCI of the proximal to mid RCA. ?-Aspirin, Plavix,  ?-Hold olmesartan, metoprolol, Imdur, allow for permissive hypertension in the setting of acute neurologic symptoms, TIA, pending MRI results. ?

## 2021-12-20 ENCOUNTER — Observation Stay (HOSPITAL_BASED_OUTPATIENT_CLINIC_OR_DEPARTMENT_OTHER): Payer: Medicare PPO

## 2021-12-20 ENCOUNTER — Observation Stay (HOSPITAL_COMMUNITY): Payer: Medicare PPO

## 2021-12-20 DIAGNOSIS — G459 Transient cerebral ischemic attack, unspecified: Secondary | ICD-10-CM | POA: Diagnosis not present

## 2021-12-20 DIAGNOSIS — I4821 Permanent atrial fibrillation: Secondary | ICD-10-CM | POA: Diagnosis not present

## 2021-12-20 DIAGNOSIS — R4781 Slurred speech: Secondary | ICD-10-CM | POA: Diagnosis not present

## 2021-12-20 DIAGNOSIS — I25110 Atherosclerotic heart disease of native coronary artery with unstable angina pectoris: Secondary | ICD-10-CM | POA: Diagnosis not present

## 2021-12-20 DIAGNOSIS — R42 Dizziness and giddiness: Secondary | ICD-10-CM | POA: Diagnosis not present

## 2021-12-20 DIAGNOSIS — I10 Essential (primary) hypertension: Secondary | ICD-10-CM | POA: Diagnosis not present

## 2021-12-20 LAB — BASIC METABOLIC PANEL
Anion gap: 8 (ref 5–15)
BUN: 23 mg/dL (ref 8–23)
CO2: 21 mmol/L — ABNORMAL LOW (ref 22–32)
Calcium: 9.4 mg/dL (ref 8.9–10.3)
Chloride: 109 mmol/L (ref 98–111)
Creatinine, Ser: 1.13 mg/dL (ref 0.61–1.24)
GFR, Estimated: >60 mL/min (ref >60)
Glucose, Bld: 114 mg/dL — ABNORMAL HIGH (ref 70–99)
Potassium: 3.5 mmol/L (ref 3.5–5.1)
Sodium: 138 mmol/L (ref 135–145)

## 2021-12-20 LAB — LIPID PANEL
Cholesterol: 142 mg/dL (ref 0–200)
HDL: 32 mg/dL — ABNORMAL LOW (ref >40)
LDL Cholesterol: 71 mg/dL (ref 0–99)
Total CHOL/HDL Ratio: 4.4 RATIO
Triglycerides: 196 mg/dL — ABNORMAL HIGH (ref <150)
VLDL: 39 mg/dL (ref 0–40)

## 2021-12-20 LAB — ECHOCARDIOGRAM COMPLETE
AR max vel: 2.87 cm2
AV Area VTI: 2.9 cm2
AV Area mean vel: 2.62 cm2
AV Mean grad: 2 mmHg
AV Peak grad: 3.2 mmHg
Ao pk vel: 0.89 m/s
Area-P 1/2: 3.92 cm2
Height: 68 in
MV VTI: 1.97 cm2
S' Lateral: 2.2 cm
Weight: 3176 oz

## 2021-12-20 LAB — GLUCOSE, CAPILLARY: Glucose-Capillary: 116 mg/dL — ABNORMAL HIGH (ref 70–99)

## 2021-12-20 MED ORDER — SODIUM CHLORIDE 0.9 % IV SOLN: INTRAVENOUS | Status: DC

## 2021-12-20 MED ORDER — POTASSIUM CHLORIDE CRYS ER 20 MEQ PO TBCR
40.0000 meq | EXTENDED_RELEASE_TABLET | Freq: Once | ORAL | Status: AC
  Administered 2021-12-20: 40 meq via ORAL
  Filled 2021-12-20: qty 2

## 2021-12-20 NOTE — Progress Notes (Signed)
SLP Cancellation Note ? ?Patient Details ?Name: Bryan Cabrera ?MRN: [id number] ?DOB: [birth year] ? ? ?Cancelled treatment:       Reason Eval/Treat Not Completed: SLP screened, no needs identified, will sign off. All changes in speech and other symptoms have now resolved. Pt is functioning at baseline. Thank you, ? ?Amreen Raczkowski H. Izora Ribas MA, CCC-SLP ?Speech Language Pathologist ? ? ? ?Wende Bushy ?12/20/2021, 10:37 AM ?

## 2021-12-20 NOTE — Discharge Summary (Signed)
Physician Discharge Summary  OLLIVANDER SEE AOZ:[id number] DOB: 01-Mar-1941 DOA: 12/19/2021  PCP: [name], MD  Admit date: 12/19/2021 Discharge date: 12/20/2021  Admitted From: Home Disposition: Home  Recommendations for Outpatient Follow-up:  Follow up with PCP in 1-2 weeks Please obtain BMP/CBC in one week Outpatient referral to neurology has been made Follow-up with primary cardiologist in the next week.  Recommend discussing statin  Home Health: Equipment/Devices:  Discharge Condition: Stable CODE STATUS: Full code Diet recommendation: Heart healthy  Brief/Interim Summary: 81 year old male with a history of atrial fibrillation, hypertension, coronary artery disease who recently had a cardiac stent placed and was discharged on 3/1.  He is on dual antiplatelet therapy.  Admitted to the hospital with transient dizziness and slurred speech.  Seen by neurology and differential included TIA, hypotension, hypoglycemia  Patient underwent MRI brain, CTA of the head and neck, echocardiogram and was monitored on telemetry.  Work-up was unrevealing.  Orthostatics were also negative.  Seen by PT, OT and speech therapy and no further needs were identified.  He was continued on aspirin and Plavix since he will need at least 1 month of dual antiplatelet therapy.  Current plans are to transition off of aspirin and start on Eliquis at the the end of 30 days.  No further work-up from neurology was recommended.  He did not have any recurrence of symptoms since admission.  Outpatient referral to neurology has been made.  The patient is prescribed Crestor, but has not taken this after concerns for side effects since he read the package insert.  He wishes to talk to his cardiologist next week regarding lipid management options.  I have strongly advised him to start back on a statin since he recently had stent placed and has significant coronary disease.  He will discuss this further with his cardiologist  on follow-up.  Discharge Diagnoses:  Principal Problem:   TIA (transient ischemic attack) Active Problems:   Essential hypertension   Permanent atrial fibrillation (HCC)   Coronary artery disease involving native coronary artery of native heart with unstable angina pectoris [facility])    Discharge Instructions  Discharge Instructions     Ambulatory referral to Neurology   Complete by: As directed    An appointment is requested in approximately: 2 weeks for follow up of TIA   Diet - low sodium heart healthy   Complete by: As directed    Increase activity slowly   Complete by: As directed       Allergies as of 12/20/2021       Reactions   Ciprofloxacin Other (See Comments)   Nausea and dizziness   Metronidazole Other (See Comments)   Nausea and dizziness   Penicillins Other (See Comments)   REACTION: has family hx ? If ever had a reaction Has patient had a PCN reaction causing immediate rash, facial/tongue/throat swelling, SOB or lightheadedness with hypotension: No Has patient had a PCN reaction causing severe rash involving mucus membranes or skin necrosis: No Has patient had a PCN reaction that required hospitalization: No Has patient had a PCN reaction occurring within the last 10 years: No If all of the above answers are "NO", then may proceed with Cephalosporin use.   Quinapril Hcl Cough   Tetracycline Hcl Nausea And Vomiting, Other (See Comments)   Vomiting with illness   Amoxicillin Other (See Comments)   REACTION: has family hx ? If ever had a reaction Has patient had a PCN reaction causing immediate rash, facial/tongue/throat swelling, SOB  or lightheadedness with hypotension: No Has patient had a PCN reaction causing severe rash involving mucus membranes or skin necrosis: No Has patient had a PCN reaction that required hospitalization: No Has patient had a PCN reaction occurring within the last 10 years: No If all of the above answers are "NO", then may proceed with  Cephalosporin use.        Medication List     STOP taking these medications    hydrochlorothiazide 25 MG tablet Commonly known as: HYDRODIURIL   potassium chloride 10 MEQ tablet Commonly known as: KLOR-CON       TAKE these medications    aspirin EC 81 MG tablet Take 1 tablet (81 mg total) by mouth daily with breakfast.   clopidogrel 75 MG tablet Commonly known as: PLAVIX Take 1 tablet (75 mg total) by mouth daily with breakfast.   isosorbide mononitrate 30 MG 24 hr tablet Commonly known as: IMDUR Take 1 tablet (30 mg total) by mouth daily.   latanoprost 0.005 % ophthalmic solution Commonly known as: XALATAN Place 1 drop into both eyes at bedtime.   metoprolol tartrate 25 MG tablet Commonly known as: LOPRESSOR Take 1 tablet (25 mg total) by mouth See admin instructions. Take one and one-half tablets by mouth 2 times daily   nitroGLYCERIN 0.4 MG SL tablet Commonly known as: Nitrostat Place 1 tablet (0.4 mg total) under the tongue every 5 (five) minutes as needed for chest pain.   olmesartan 40 MG tablet Commonly known as: BENICAR Take 1 tablet (40 mg total) by mouth daily. What changed: when to take this   ondansetron 8 MG tablet Commonly known as: Zofran Take 1 tablet (8 mg total) by mouth every 6 (six) hours as needed for nausea or vomiting.   rosuvastatin 40 MG tablet Commonly known as: CRESTOR Take 1 tablet (40 mg total) by mouth daily.        Allergies  Allergen Reactions   Ciprofloxacin Other (See Comments)    Nausea and dizziness   Metronidazole Other (See Comments)    Nausea and dizziness   Penicillins Other (See Comments)    REACTION: has family hx ? If ever had a reaction Has patient had a PCN reaction causing immediate rash, facial/tongue/throat swelling, SOB or lightheadedness with hypotension: No Has patient had a PCN reaction causing severe rash involving mucus membranes or skin necrosis: No Has patient had a PCN reaction that  required hospitalization: No Has patient had a PCN reaction occurring within the last 10 years: No If all of the above answers are "NO", then may proceed with Cephalosporin use.     Quinapril Hcl Cough   Tetracycline Hcl Nausea And Vomiting and Other (See Comments)    Vomiting with illness   Amoxicillin Other (See Comments)    REACTION: has family hx ? If ever had a reaction Has patient had a PCN reaction causing immediate rash, facial/tongue/throat swelling, SOB or lightheadedness with hypotension: No Has patient had a PCN reaction causing severe rash involving mucus membranes or skin necrosis: No Has patient had a PCN reaction that required hospitalization: No Has patient had a PCN reaction occurring within the last 10 years: No If all of the above answers are "NO", then may proceed with Cephalosporin use.     Consultations: Neurology   Procedures/Studies: CT ANGIO HEAD NECK W WO CM  Result Date: 12/20/2021 CLINICAL DATA:  Initial evaluation for recurrent episodes of slurred speech, dizziness. EXAM: CT ANGIOGRAPHY HEAD AND NECK TECHNIQUE: Multidetector CT  imaging of the head and neck was performed using the standard protocol during bolus administration of intravenous contrast. Multiplanar CT image reconstructions and MIPs were obtained to evaluate the vascular anatomy. Carotid stenosis measurements (when applicable) are obtained utilizing NASCET criteria, using the distal internal carotid diameter as the denominator. RADIATION DOSE REDUCTION: This exam was performed according to the departmental dose-optimization program which includes automated exposure control, adjustment of the mA and/or kV according to patient size and/or use of iterative reconstruction technique. CONTRAST:  67m OMNIPAQUE IOHEXOL 350 MG/ML SOLN COMPARISON:  Prior CT from earlier the same day. FINDINGS: CTA NECK FINDINGS Aortic arch: Visualized aortic arch normal in caliber with normal branch pattern. No significant  stenosis about the origin the great vessels. Right carotid system: Right common and internal carotid arteries patent without stenosis or dissection. Left carotid system: Left common and internal carotid arteries patent without stenosis or dissection. Vertebral arteries: Both vertebral arteries arise from the subclavian arteries. No proximal subclavian artery stenosis. Both vertebral arteries widely patent without stenosis, dissection or occlusion. Skeleton: No discrete or worrisome osseous lesions. Mild to moderate cervical spondylosis, most pronounced at C5-6. Other neck: No other acute soft tissue abnormality within the neck. 7 mm right thyroid nodule noted, of doubtful significance given size and patient age, no follow-up imaging recommended (ref: J Am Coll Radiol. 2015 Feb;12(2): 143-50). Upper chest: Visualized upper chest demonstrates no acute finding. Review of the MIP images confirms the above findings CTA HEAD FINDINGS Anterior circulation: Both internal carotid arteries widely patent to the termini without stenosis or other abnormality. A1 segments patent bilaterally. Normal anterior communicating artery complex. Anterior cerebral arteries widely patent bilaterally. No M1 stenosis or occlusion. Normal MCA bifurcations. Distal MCA branches perfused and symmetric. Posterior circulation: Dominant left vertebral artery widely patent to the vertebrobasilar junction. Left PICA patent at its origin. Diminutive right vertebral artery terminates in PICA. Right PICA patent as well. Basilar patent to its distal aspect without stenosis. Superior cerebellar and posterior cerebral arteries widely patent bilaterally. Venous sinuses: Patent allowing for timing the contrast bolus. Anatomic variants: Diminutive right vertebral artery largely terminates in PICA. No intracranial aneurysm. Review of the MIP images confirms the above findings IMPRESSION: Normal CTA of the head and neck. No large vessel occlusion,  hemodynamically significant stenosis, or other acute vascular abnormality. Electronically Signed   By: BJeannine BogaM.D.   On: 12/20/2021 02:24   DG Chest 2 View  Result Date: 11/24/2021 CLINICAL DATA:  Chest pain EXAM: CHEST - 2 VIEW COMPARISON:  10/04/2021 FINDINGS: Numerous leads and wires project over the chest. Midline trachea. Normal heart size and mediastinal contours. No pleural effusion or pneumothorax. Clear lungs. IMPRESSION: No active cardiopulmonary disease. Electronically Signed   By: KAbigail MiyamotoM.D.   On: 11/24/2021 19:39   MR BRAIN WO CONTRAST  Result Date: 12/20/2021 CLINICAL DATA:  Recurrent episodes of slurred speech, dizziness, poor coordination, history of AFib EXAM: MRI HEAD WITHOUT CONTRAST TECHNIQUE: Multiplanar, multiecho pulse sequences of the brain and surrounding structures were obtained without intravenous contrast. COMPARISON:  CT/CTA head and neck dated 1 day prior FINDINGS: Brain: There is no evidence of acute intracranial hemorrhage, extra-axial fluid collection, or acute infarct. There is moderate global parenchymal volume loss with prominence of the ventricular system and extra-axial CSF spaces, overall not significantly changed since the prior MRI from 2016. The ventricles are stable in size compared to the study. Parenchymal signal is normal, with no significant burden of white matter microangiopathic change. Gray-white  differentiation is preserved. There is no mass lesion. There is no mass effect or midline shift. An empty sella is again noted. Vascular: Normal flow voids. Skull and upper cervical spine: Normal marrow signal. Sinuses/Orbits: The paranasal sinuses are clear. Bilateral lens implants are in place. The globes and orbits are otherwise unremarkable. Other: None. IMPRESSION: 1. No acute intracranial pathology. 2. Stable moderate global parenchymal volume loss. Electronically Signed   By: Valetta Mole M.D.   On: 12/20/2021 09:29   CARDIAC  CATHETERIZATION  Result Date: 12/13/2021   CULPRIT LESION SEGMENT: Prox RCA lesion is 50% stenosed.  Prox RCA to Mid RCA lesion is 99% stenosed.  TIMI 0 flow noted in the PDA   Prox RCA to Mid RCA lesion is 99% stenosed.   A drug-eluting stent was successfully placed using a STENT ONYX FRONTIER 3.5X30-deployed to 3.8 mm.   Post intervention, there is a 0% residual stenosis throughout after postdilation, TIMI-3 flow restored from TIMI 0..   -------------------------------   Otherwise angiographically normal Left Coronary System.   LV end diastolic pressure is normal.   There is no aortic valve stenosis. SUMMARY Severe single-vessel CAD with tapered to 50% down to 99% subtotal occlusion of proximal to mid RCA with TIMI 0 flow down the PDA Successful DES PCI of the proximal to mid RCA 50-99% stenoses using Onyx frontier DES 3.5 mm x 30 mm deployed to 3.8 mm.- Reduced to 0% stenosis, TIMI-3 flow restored. Otherwise angiographically minimal CAD in the Left Coronary System. Known preserved EF by stress test, normal LVEDP RECOMMENDATIONS Due to advanced age, and infirm wife at home, safer to monitor overnight as outpatient with extended recovery.  Plan discharge in the morning. Continue cardiovascular risk factor modification. Anticoagulation/antiplatelet agents per Recommendations section [name], MD  MYOCARDIAL PERFUSION IMAGING  Result Date: 11/30/2021 Normal resting and stress perfusion. No ischemia or infarction EF 68%   ECHOCARDIOGRAM COMPLETE  Result Date: 12/20/2021    ECHOCARDIOGRAM REPORT   Patient Name:   CALLUM WOLF Date of Exam: 12/20/2021 Medical Rec #:  [id number]       Height:       68.0 in Accession #:    [id number]      Weight:       198.5 lb Date of Birth:  06/30/1941       BSA:          2.037 m Patient Age:    38 years        BP:           126/69 mmHg Patient Gender: M               HR:           84 bpm. Exam Location:  [facility] Procedure: 2D Echo, Cardiac Doppler and Color Doppler  Indications:    TIA  History:        Patient has prior history of Echocardiogram examinations, most                 recent 12/10/2019. CAD, TIA, Arrythmias:Atrial Fibrillation,                 Signs/Symptoms:Chest Pain; Risk Factors:Hypertension and                 Dyslipidemia.  Sonographer:    Wenda Low Referring Phys: [place]  1. Left ventricular ejection fraction, by estimation, is 65 to 70%. The left ventricle has normal function. The left  ventricle has no regional wall motion abnormalities. There is mild asymmetric left ventricular hypertrophy. Left ventricular diastolic parameters are indeterminate.  2. Right ventricular systolic function is normal. The right ventricular size is normal. Increased right ventricular wall thickness. There is normal pulmonary artery systolic pressure.  3. Left atrial size was moderately dilated.  4. The mitral valve is normal in structure. Trivial mitral valve regurgitation. No evidence of mitral stenosis.  5. The aortic valve is tricuspid. Aortic valve regurgitation is not visualized. No aortic stenosis is present.  6. The inferior vena cava is normal in size with greater than 50% respiratory variability, suggesting right atrial pressure of 3 mmHg. Comparison(s): No significant change from prior study. FINDINGS  Left Ventricle: Left ventricular ejection fraction, by estimation, is 65 to 70%. The left ventricle has normal function. The left ventricle has no regional wall motion abnormalities. The left ventricular internal cavity size was normal in size. There is  mild asymmetric left ventricular hypertrophy. Left ventricular diastolic parameters are indeterminate. Right Ventricle: The right ventricular size is normal. Mildly increased right ventricular wall thickness. Right ventricular systolic function is normal. There is normal pulmonary artery systolic pressure. The tricuspid regurgitant velocity is 1.79 m/s, and with an assumed right atrial  pressure of 3 mmHg, the estimated right ventricular systolic pressure is 76.5 mmHg. Left Atrium: Left atrial size was moderately dilated. Right Atrium: Right atrial size was normal in size. Pericardium: Trivial pericardial effusion is present. Presence of epicardial fat layer. Mitral Valve: The mitral valve is normal in structure. Trivial mitral valve regurgitation. No evidence of mitral valve stenosis. MV peak gradient, 3.5 mmHg. The mean mitral valve gradient is 1.0 mmHg. Tricuspid Valve: The tricuspid valve is normal in structure. Tricuspid valve regurgitation is not demonstrated. No evidence of tricuspid stenosis. Aortic Valve: The aortic valve is tricuspid. Aortic valve regurgitation is not visualized. No aortic stenosis is present. Aortic valve mean gradient measures 2.0 mmHg. Aortic valve peak gradient measures 3.2 mmHg. Aortic valve area, by VTI measures 2.90 cm. Pulmonic Valve: The pulmonic valve was grossly normal. Pulmonic valve regurgitation is trivial. No evidence of pulmonic stenosis. Aorta: The aortic root and ascending aorta are structurally normal, with no evidence of dilitation. Venous: The inferior vena cava is normal in size with greater than 50% respiratory variability, suggesting right atrial pressure of 3 mmHg. IAS/Shunts: No atrial level shunt detected by color flow Doppler.  LEFT VENTRICLE PLAX 2D LVIDd:         4.00 cm   Diastology LVIDs:         2.20 cm   LV e' medial:    9.90 cm/s LV PW:         0.80 cm   LV E/e' medial:  10.5 LV IVS:        1.20 cm   LV e' lateral:   12.00 cm/s LVOT diam:     1.90 cm   LV E/e' lateral: 8.6 LV SV:         53 LV SV Index:   26 LVOT Area:     2.84 cm  RIGHT VENTRICLE RV Basal diam:  3.75 cm RV Mid diam:    3.10 cm RV S prime:     13.40 cm/s TAPSE (M-mode): 2.8 cm LEFT ATRIUM             Index        RIGHT ATRIUM           Index LA diam:  3.80 cm 1.87 cm/m   RA Area:     18.50 cm LA Vol (A2C):   77.4 ml 37.99 ml/m  RA Volume:   52.90 ml  25.97  ml/m LA Vol (A4C):   95.4 ml 46.83 ml/m LA Biplane Vol: 92.0 ml 45.16 ml/m  AORTIC VALVE                    PULMONIC VALVE AV Area (Vmax):    2.87 cm     PV Vmax:       0.62 m/s AV Area (Vmean):   2.62 cm     PV Peak grad:  1.5 mmHg AV Area (VTI):     2.90 cm AV Vmax:           89.45 cm/s AV Vmean:          62.400 cm/s AV VTI:            0.183 m AV Peak Grad:      3.2 mmHg AV Mean Grad:      2.0 mmHg LVOT Vmax:         90.40 cm/s LVOT Vmean:        57.600 cm/s LVOT VTI:          0.187 m LVOT/AV VTI ratio: 1.02  AORTA Ao Root diam: 3.50 cm Ao Asc diam:  3.30 cm MITRAL VALVE                TRICUSPID VALVE MV Area (PHT): 3.92 cm     TR Peak grad:   12.8 mmHg MV Area VTI:   1.97 cm     TR Vmax:        179.00 cm/s MV Peak grad:  3.5 mmHg MV Mean grad:  1.0 mmHg     SHUNTS MV Vmax:       0.93 m/s     Systemic VTI:  0.19 m MV Vmean:      39.8 cm/s    Systemic Diam: 1.90 cm MV Decel Time: 194 msec MV E velocity: 103.50 cm/s Rudean Haskell MD Electronically signed by Rudean Haskell MD Signature Date/Time: 12/20/2021/10:00:24 AM    Final    CT HEAD CODE STROKE WO CONTRAST  Result Date: 12/19/2021 CLINICAL DATA:  Code stroke. Abnormal speech and gait starting at 2:30 p.m., dizziness, on Plavix EXAM: CT HEAD WITHOUT CONTRAST TECHNIQUE: Contiguous axial images were obtained from the base of the skull through the vertex without intravenous contrast. RADIATION DOSE REDUCTION: This exam was performed according to the departmental dose-optimization program which includes automated exposure control, adjustment of the mA and/or kV according to patient size and/or use of iterative reconstruction technique. COMPARISON:  CT head 09/20/2021, brain MRI 12/09/2019 FINDINGS: Brain: There is no evidence of acute intracranial hemorrhage, extra-axial fluid collection, or acute infarct. There is moderate global parenchymal volume loss with prominence of the ventricular system and extra-axial CSF spaces. The ventricles are  similar in size compared to the MRI from 2016. There is no significant burden of white matter microangiopathic change by CT. Gray-white differentiation is preserved. There is no mass lesion. There is no mass effect or midline shift. An empty sella is again noted. Vascular: No hyperdense vessel or unexpected calcification. Skull: Normal. Negative for fracture or focal lesion. Sinuses/Orbits: The paranasal sinuses are clear. Bilateral lens implants are in place. The globes and orbits are otherwise unremarkable. Other: None. ASPECTS [facility] Stroke Program Early CT Score) - Ganglionic level infarction (caudate, lentiform nuclei, internal capsule,  insula, M1-M3 cortex): 7 - Supraganglionic infarction (M4-M6 cortex): 3 Total score (0-10 with 10 being normal): 10 IMPRESSION: 1. No acute intracranial pathology. 2. ASPECTS is 10 These results were called by telephone at the time of interpretation on 12/19/2021 at 5:52 pm to provider [facility] , who verbally acknowledged these results. Electronically Signed   By: Valetta Mole M.D.   On: 12/19/2021 17:55      Subjective: He is feeling better.  Did not have any dizziness on standing today.  No issues with his speech today.  Discharge Exam: Vitals:   12/20/21 0400 12/20/21 0600 12/20/21 1000 12/20/21 1324  BP: 114/63 126/69 122/64 118/74  Pulse: (!) 59 79 79 68  Resp: '17 19 18 20  '$ Temp: 98 F (36.7 C) 98.4 F (36.9 C) 98.3 F (36.8 C) 97.7 F (36.5 C)  TempSrc: Oral Oral Oral Oral  SpO2: 99% 100% 99% 98%  Weight:      Height:        General: Pt is alert, awake, not in acute distress Cardiovascular: RRR, S1/S2 +, no rubs, no gallops Respiratory: CTA bilaterally, no wheezing, no rhonchi Abdominal: Soft, NT, ND, bowel sounds + Extremities: no edema, no cyanosis    The results of significant diagnostics from this hospitalization (including imaging, microbiology, ancillary and laboratory) are listed below for reference.     Microbiology: Recent  Results (from the past 240 hour(s))  Resp Panel by RT-PCR (Flu A&B, Covid) Nasopharyngeal Swab     Status: None   Collection Time: 12/13/21  5:25 PM   Specimen: Nasopharyngeal Swab; Nasopharyngeal(NP) swabs in vial transport medium  Result Value Ref Range Status   SARS Coronavirus 2 by RT PCR NEGATIVE NEGATIVE Final    Comment: (NOTE) SARS-CoV-2 target nucleic acids are NOT DETECTED.  The SARS-CoV-2 RNA is generally detectable in upper respiratory specimens during the acute phase of infection. The lowest concentration of SARS-CoV-2 viral copies this assay can detect is 138 copies/mL. A negative result does not preclude SARS-Cov-2 infection and should not be used as the sole basis for treatment or other patient management decisions. A negative result may occur with  improper specimen collection/handling, submission of specimen other than nasopharyngeal swab, presence of viral mutation(s) within the areas targeted by this assay, and inadequate number of viral copies(<138 copies/mL). A negative result must be combined with clinical observations, patient history, and epidemiological information. The expected result is Negative.  Fact Sheet for Patients:  EntrepreneurPulse.com.au  Fact Sheet for Healthcare Providers:  IncredibleEmployment.be  This test is no t yet approved or cleared by the [place] FDA and  has been authorized for detection and/or diagnosis of SARS-CoV-2 by FDA under an Emergency Use Authorization (EUA). This EUA will remain  in effect (meaning this test can be used) for the duration of the COVID-19 declaration under Section 564(b)(1) of the Act, 21 U.S.C.section 360bbb-3(b)(1), unless the authorization is terminated  or revoked sooner.       Influenza A by PCR NEGATIVE NEGATIVE Final   Influenza B by PCR NEGATIVE NEGATIVE Final    Comment: (NOTE) The Xpert Xpress SARS-CoV-2/FLU/RSV plus assay is intended as an aid in the  diagnosis of influenza from Nasopharyngeal swab specimens and should not be used as a sole basis for treatment. Nasal washings and aspirates are unacceptable for Xpert Xpress SARS-CoV-2/FLU/RSV testing.  Fact Sheet for Patients: EntrepreneurPulse.com.au  Fact Sheet for Healthcare Providers: IncredibleEmployment.be  This test is not yet approved or cleared by the [place] FDA  and has been authorized for detection and/or diagnosis of SARS-CoV-2 by FDA under an Emergency Use Authorization (EUA). This EUA will remain in effect (meaning this test can be used) for the duration of the COVID-19 declaration under Section 564(b)(1) of the Act, 21 U.S.C. section 360bbb-3(b)(1), unless the authorization is terminated or revoked.  Performed at [facility], [place] [address] [place], [place] [address]   Resp Panel by RT-PCR (Flu A&B, Covid) Nasopharyngeal Swab     Status: None   Collection Time: 12/19/21  7:04 PM   Specimen: Nasopharyngeal Swab; Nasopharyngeal(NP) swabs in vial transport medium  Result Value Ref Range Status   SARS Coronavirus 2 by RT PCR NEGATIVE NEGATIVE Final    Comment: (NOTE) SARS-CoV-2 target nucleic acids are NOT DETECTED.  The SARS-CoV-2 RNA is generally detectable in upper respiratory specimens during the acute phase of infection. The lowest concentration of SARS-CoV-2 viral copies this assay can detect is 138 copies/mL. A negative result does not preclude SARS-Cov-2 infection and should not be used as the sole basis for treatment or other patient management decisions. A negative result may occur with  improper specimen collection/handling, submission of specimen other than nasopharyngeal swab, presence of viral mutation(s) within the areas targeted by this assay, and inadequate number of viral copies(<138 copies/mL). A negative result must be combined with clinical observations, patient history, and  epidemiological information. The expected result is Negative.  Fact Sheet for Patients:  EntrepreneurPulse.com.au  Fact Sheet for Healthcare Providers:  IncredibleEmployment.be  This test is no t yet approved or cleared by the [place] FDA and  has been authorized for detection and/or diagnosis of SARS-CoV-2 by FDA under an Emergency Use Authorization (EUA). This EUA will remain  in effect (meaning this test can be used) for the duration of the COVID-19 declaration under Section 564(b)(1) of the Act, 21 U.S.C.section 360bbb-3(b)(1), unless the authorization is terminated  or revoked sooner.       Influenza A by PCR NEGATIVE NEGATIVE Final   Influenza B by PCR NEGATIVE NEGATIVE Final    Comment: (NOTE) The Xpert Xpress SARS-CoV-2/FLU/RSV plus assay is intended as an aid in the diagnosis of influenza from Nasopharyngeal swab specimens and should not be used as a sole basis for treatment. Nasal washings and aspirates are unacceptable for Xpert Xpress SARS-CoV-2/FLU/RSV testing.  Fact Sheet for Patients: EntrepreneurPulse.com.au  Fact Sheet for Healthcare Providers: IncredibleEmployment.be  This test is not yet approved or cleared by the [place] FDA and has been authorized for detection and/or diagnosis of SARS-CoV-2 by FDA under an Emergency Use Authorization (EUA). This EUA will remain in effect (meaning this test can be used) for the duration of the COVID-19 declaration under Section 564(b)(1) of the Act, 21 U.S.C. section 360bbb-3(b)(1), unless the authorization is terminated or revoked.  Performed at [facility], [address], [place], [place] [address]      Labs: BNP (last 3 results) No results for input(s): BNP in the last 8760 hours. Basic Metabolic Panel: Recent Labs  Lab 12/14/21 0232 12/19/21 1740 12/19/21 1743 12/20/21 0546  NA 139 139 140 138  K 3.3* 3.9 4.0 3.5  CL  108 103 104 109  CO2 23 25  --  21*  GLUCOSE 102* 104* 104* 114*  BUN 12 27* 27* 23  CREATININE 1.02 1.30* 1.40* 1.13  CALCIUM 9.3 10.2  --  9.4   Liver Function Tests: Recent Labs  Lab 12/19/21 1740  AST 23  ALT 32  ALKPHOS 66  BILITOT 0.8  PROT 8.1  ALBUMIN 4.5   No results for input(s): LIPASE, AMYLASE in the last 168 hours. No results for input(s): AMMONIA in the last 168 hours. CBC: Recent Labs  Lab 12/14/21 0232 12/19/21 1740 12/19/21 1743  WBC 8.0 7.6  --   NEUTROABS  --  4.4  --   HGB 13.8 16.0 16.7  HCT 40.2 48.2 49.0  MCV 84.5 85.0  --   PLT 211 272  --    Cardiac Enzymes: No results for input(s): CKTOTAL, CKMB, CKMBINDEX, TROPONINI in the last 168 hours. BNP: Invalid input(s): POCBNP CBG: Recent Labs  Lab 12/19/21 1738  GLUCAP 116*   D-Dimer No results for input(s): DDIMER in the last 72 hours. Hgb A1c No results for input(s): HGBA1C in the last 72 hours. Lipid Profile Recent Labs    12/20/21 0546  CHOL 142  HDL 32*  LDLCALC 71  TRIG 196*  CHOLHDL 4.4   Thyroid function studies No results for input(s): TSH, T4TOTAL, T3FREE, THYROIDAB in the last 72 hours.  Invalid input(s): FREET3 Anemia work up No results for input(s): VITAMINB12, FOLATE, FERRITIN, TIBC, IRON, RETICCTPCT in the last 72 hours. Urinalysis    Component Value Date/Time   COLORURINE YELLOW 12/19/2021 1945   APPEARANCEUR CLEAR 12/19/2021 1945   LABSPEC 1.019 12/19/2021 1945   PHURINE 5.0 12/19/2021 1945   GLUCOSEU NEGATIVE 12/19/2021 1945   HGBUR NEGATIVE 12/19/2021 1945   HGBUR negative 11/21/2007 1012   BILIRUBINUR NEGATIVE 12/19/2021 1945   BILIRUBINUR neg 04/15/2021 1427   KETONESUR NEGATIVE 12/19/2021 1945   PROTEINUR NEGATIVE 12/19/2021 1945   UROBILINOGEN 0.2 04/15/2021 1427   UROBILINOGEN 0.2 08/15/2015 2350   NITRITE NEGATIVE 12/19/2021 1945   LEUKOCYTESUR NEGATIVE 12/19/2021 1945   Sepsis Labs Invalid input(s): PROCALCITONIN,  WBC,   LACTICIDVEN Microbiology Recent Results (from the past 240 hour(s))  Resp Panel by RT-PCR (Flu A&B, Covid) Nasopharyngeal Swab     Status: None   Collection Time: 12/13/21  5:25 PM   Specimen: Nasopharyngeal Swab; Nasopharyngeal(NP) swabs in vial transport medium  Result Value Ref Range Status   SARS Coronavirus 2 by RT PCR NEGATIVE NEGATIVE Final    Comment: (NOTE) SARS-CoV-2 target nucleic acids are NOT DETECTED.  The SARS-CoV-2 RNA is generally detectable in upper respiratory specimens during the acute phase of infection. The lowest concentration of SARS-CoV-2 viral copies this assay can detect is 138 copies/mL. A negative result does not preclude SARS-Cov-2 infection and should not be used as the sole basis for treatment or other patient management decisions. A negative result may occur with  improper specimen collection/handling, submission of specimen other than nasopharyngeal swab, presence of viral mutation(s) within the areas targeted by this assay, and inadequate number of viral copies(<138 copies/mL). A negative result must be combined with clinical observations, patient history, and epidemiological information. The expected result is Negative.  Fact Sheet for Patients:  EntrepreneurPulse.com.au  Fact Sheet for Healthcare Providers:  IncredibleEmployment.be  This test is no t yet approved or cleared by the [place] FDA and  has been authorized for detection and/or diagnosis of SARS-CoV-2 by FDA under an Emergency Use Authorization (EUA). This EUA will remain  in effect (meaning this test can be used) for the duration of the COVID-19 declaration under Section 564(b)(1) of the Act, 21 U.S.C.section 360bbb-3(b)(1), unless the authorization is terminated  or revoked sooner.       Influenza A by PCR NEGATIVE NEGATIVE Final   Influenza B by PCR NEGATIVE NEGATIVE  Final    Comment: (NOTE) The Xpert Xpress SARS-CoV-2/FLU/RSV plus  assay is intended as an aid in the diagnosis of influenza from Nasopharyngeal swab specimens and should not be used as a sole basis for treatment. Nasal washings and aspirates are unacceptable for Xpert Xpress SARS-CoV-2/FLU/RSV testing.  Fact Sheet for Patients: EntrepreneurPulse.com.au  Fact Sheet for Healthcare Providers: IncredibleEmployment.be  This test is not yet approved or cleared by the [place] FDA and has been authorized for detection and/or diagnosis of SARS-CoV-2 by FDA under an Emergency Use Authorization (EUA). This EUA will remain in effect (meaning this test can be used) for the duration of the COVID-19 declaration under Section 564(b)(1) of the Act, 21 U.S.C. section 360bbb-3(b)(1), unless the authorization is terminated or revoked.  Performed at [facility], [place] [address], [place], [place] [address]   Resp Panel by RT-PCR (Flu A&B, Covid) Nasopharyngeal Swab     Status: None   Collection Time: 12/19/21  7:04 PM   Specimen: Nasopharyngeal Swab; Nasopharyngeal(NP) swabs in vial transport medium  Result Value Ref Range Status   SARS Coronavirus 2 by RT PCR NEGATIVE NEGATIVE Final    Comment: (NOTE) SARS-CoV-2 target nucleic acids are NOT DETECTED.  The SARS-CoV-2 RNA is generally detectable in upper respiratory specimens during the acute phase of infection. The lowest concentration of SARS-CoV-2 viral copies this assay can detect is 138 copies/mL. A negative result does not preclude SARS-Cov-2 infection and should not be used as the sole basis for treatment or other patient management decisions. A negative result may occur with  improper specimen collection/handling, submission of specimen other than nasopharyngeal swab, presence of viral mutation(s) within the areas targeted by this assay, and inadequate number of viral copies(<138 copies/mL). A negative result must be combined with clinical observations,  patient history, and epidemiological information. The expected result is Negative.  Fact Sheet for Patients:  EntrepreneurPulse.com.au  Fact Sheet for Healthcare Providers:  IncredibleEmployment.be  This test is no t yet approved or cleared by the [place] FDA and  has been authorized for detection and/or diagnosis of SARS-CoV-2 by FDA under an Emergency Use Authorization (EUA). This EUA will remain  in effect (meaning this test can be used) for the duration of the COVID-19 declaration under Section 564(b)(1) of the Act, 21 U.S.C.section 360bbb-3(b)(1), unless the authorization is terminated  or revoked sooner.       Influenza A by PCR NEGATIVE NEGATIVE Final   Influenza B by PCR NEGATIVE NEGATIVE Final    Comment: (NOTE) The Xpert Xpress SARS-CoV-2/FLU/RSV plus assay is intended as an aid in the diagnosis of influenza from Nasopharyngeal swab specimens and should not be used as a sole basis for treatment. Nasal washings and aspirates are unacceptable for Xpert Xpress SARS-CoV-2/FLU/RSV testing.  Fact Sheet for Patients: EntrepreneurPulse.com.au  Fact Sheet for Healthcare Providers: IncredibleEmployment.be  This test is not yet approved or cleared by the [place] FDA and has been authorized for detection and/or diagnosis of SARS-CoV-2 by FDA under an Emergency Use Authorization (EUA). This EUA will remain in effect (meaning this test can be used) for the duration of the COVID-19 declaration under Section 564(b)(1) of the Act, 21 U.S.C. section 360bbb-3(b)(1), unless the authorization is terminated or revoked.  Performed at [facility], [address], [place], [place] [address]      Time coordinating discharge: 3mns  SIGNED:   JKathie Dike MD  [facility] 12/20/2021, 9:41 PM   If 7PM-7AM, please contact night-coverage [email or web address]

## 2021-12-20 NOTE — Evaluation (Signed)
Physical Therapy Evaluation ?Patient Details ?Name: Bryan Cabrera ?MRN: [id number] ?DOB: 1941-07-26 ?Today's Date: 12/20/2021 ? ?History of Present Illness ? Bryan Cabrera is a 81 y.o. male with medical history significant for atrial fibrillation, hypertension, coronary artery disease, OSA.  Patient presented to the ED with complaints of dizziness, poor coordination and balance, with slurred speech.  Patient reports he has had the symptoms intermittently since he had his cardiac cath with stent placement 12/14/2021.  Cardiology Notes from 12/08/2021, notes that patient had reported 2 separate episodes when he felt like his speech was slurring which lasted less than a minute.  Per discharge summary 12/14/2021-had been on Eliquis in the past on this.  11/2020 after he fell sustaining a large hip hematoma.  It was recommended that patient get back on anticoagulation as soon as possible, tentatively patient is to start Eliquis 01/10/2022 after he has completed 1 month of dual antiplatelet therapy following PCI.  Patient reports compliance with both aspirin and Plavix since discharge.  He has not been taking his statins.     He reports symptoms that he had today are similar to prior, and usually last about 1 to 2 minutes.  He did not have any weakness of his extremity, no facial asymmetry was reported or noted.  On my evaluation his speech is back to baseline. ?  ?Clinical Impression ? Patient functioning at baseline for functional mobility and gait demonstrating good return for bed mobility, transfers and ambulation in room, hallways without loss of balance.  Plan:  Patient discharged from physical therapy to care of nursing for ambulation daily as tolerated for length of stay.  ?   ?   ? ?Recommendations for follow up therapy are one component of a multi-disciplinary discharge planning process, led by the attending physician.  Recommendations may be updated based on patient status, additional functional criteria and insurance  authorization. ? ?Follow Up Recommendations No PT follow up ? ?  ?Assistance Recommended at Discharge PRN  ?Patient can return home with the following ? Other (comment) (at baseline) ? ?  ?Equipment Recommendations None recommended by PT  ?Recommendations for Other Services ?    ?  ?Functional Status Assessment Patient has not had a recent decline in their functional status  ? ?  ?Precautions / Restrictions Precautions ?Precautions: None ?Restrictions ?Weight Bearing Restrictions: No  ? ?  ? ?Mobility ? Bed Mobility ?Overal bed mobility: Independent ?  ?  ?  ?  ?  ?  ?  ?  ? ?Transfers ?Overall transfer level: Independent ?  ?  ?  ?  ?  ?  ?  ?  ?  ?  ? ?Ambulation/Gait ?Ambulation/Gait assistance: Modified independent (Device/Increase time) ?Gait Distance (Feet): 200 Feet ?Assistive device: None ?Gait Pattern/deviations: WFL(Within Functional Limits) ?Gait velocity: near normal ?  ?  ?General Gait Details: grossly WFL with good return for ambulation on level, inclined and declined surfaces without loss of balance ? ?Stairs ?  ?  ?  ?  ?  ? ?Wheelchair Mobility ?  ? ?Modified Rankin (Stroke Patients Only) ?  ? ?  ? ?Balance Overall balance assessment: No apparent balance deficits (not formally assessed) ?  ?  ?  ?  ?  ?  ?  ?  ?  ?  ?  ?  ?  ?  ?  ?  ?  ?  ?   ? ? ? ?Pertinent Vitals/Pain Pain Assessment ?Pain Assessment: No/denies pain  ? ? ?Home  Living Family/patient expects to be discharged to:: Private residence ?Living Arrangements: Spouse/significant other ?Available Help at Discharge: Family;Available PRN/intermittently ?Type of Home: House ?Home Access: Ramped entrance ?  ?  ?  ?Home Layout: One level ?Home Equipment: Standard Walker;Tub bench ?   ?  ?Prior Function Prior Level of Function : Independent/Modified Independent ?  ?  ?  ?  ?  ?  ?Mobility Comments: Hydrographic surveyor, drives, works full time as a Oceanographer ?ADLs Comments: Independent ?  ? ? ?Hand Dominance  ?   ? ?  ?Extremity/Trunk Assessment   ? Upper Extremity Assessment ?Upper Extremity Assessment: Defer to OT evaluation ?  ? ?Lower Extremity Assessment ?Lower Extremity Assessment: Overall WFL for tasks assessed ?  ? ?Cervical / Trunk Assessment ?Cervical / Trunk Assessment: Normal  ?Communication  ? Communication: No difficulties  ?Cognition Arousal/Alertness: Awake/alert ?Behavior During Therapy: [facility] for tasks assessed/performed ?Overall Cognitive Status: Within Functional Limits for tasks assessed ?  ?  ?  ?  ?  ?  ?  ?  ?  ?  ?  ?  ?  ?  ?  ?  ?  ?  ?  ? ?  ?General Comments   ? ?  ?Exercises    ? ?Assessment/Plan  ?  ?PT Assessment Patient does not need any further PT services  ?PT Problem List   ? ?   ?  ?PT Treatment Interventions     ? ?PT Goals (Current goals can be found in the Care Plan section)  ?Acute Rehab PT Goals ?Patient Stated Goal: return home ?PT Goal Formulation: With patient ?Time For Goal Achievement: 12/20/21 ?Potential to Achieve Goals: Good ? ?  ?Frequency   ?  ? ? ?Co-evaluation   ?  ?  ?  ?  ? ? ?  ?AM-PAC PT "6 Clicks" Mobility  ?Outcome Measure Help needed turning from your back to your side while in a flat bed without using bedrails?: None ?Help needed moving from lying on your back to sitting on the side of a flat bed without using bedrails?: None ?Help needed moving to and from a bed to a chair (including a wheelchair)?: None ?Help needed standing up from a chair using your arms (e.g., wheelchair or bedside chair)?: None ?Help needed to walk in hospital room?: None ?Help needed climbing 3-5 steps with a railing? : None ?6 Click Score: 24 ? ?  ?End of Session   ?Activity Tolerance: Patient tolerated treatment well ?Patient left: in bed;with call bell/phone within reach ?Nurse Communication: Mobility status ?PT Visit Diagnosis: Unsteadiness on feet (R26.81);Other abnormalities of gait and mobility (R26.89);Muscle weakness (generalized) (M62.81) ?  ? ?Time: 5176-1607 ?PT Time Calculation (min) (ACUTE ONLY): 17  min ? ? ?Charges:   PT Evaluation ?$PT Eval Low Complexity: 1 Low ?PT Treatments ?$Therapeutic Activity: 8-22 mins ?  ?   ? ? ?1:53 PM, 12/20/21 ?Lonell Grandchild, MPT ?Physical Therapist with [facility] ?[facility] ?[phone] office ?[phone] mobile phone ? ? ?

## 2021-12-20 NOTE — Progress Notes (Signed)
OT Cancellation Note ? ?Patient Details ?Name: Bryan Cabrera ?MRN: [id number] ?DOB: [birth year] ? ? ?Cancelled Treatment:    Reason Eval/Treat Not Completed: OT screened, no needs identified, will sign off. Pt standing at sink upon entrance into patient room. Pt briefly screened with good B UE strength, no numbness or tingling. Good visual tracking without notable peripheral field loss. Pt reported that blurred vision had resolved. Good fine motor skills for sequential finger touching. Pt reports being back to baseline.  ? ?Jorian Willhoite OT, MOT ? ? ?Larey Seat ?12/20/2021, 10:21 AM ?

## 2021-12-20 NOTE — Progress Notes (Signed)
Nsg Discharge Note ? ?Admit Date:  12/19/2021 ?Discharge date: 12/20/2021 ?  ?[name] to be D/C'd Home per MD order.  AVS completed.  Copy for chart, and copy for patient signed, and dated. ?Patient/caregiver able to verbalize understanding. ? ?Discharge Medication: ?Allergies as of 12/20/2021   ? ?   Reactions  ? Ciprofloxacin Other (See Comments)  ? Nausea and dizziness  ? Metronidazole Other (See Comments)  ? Nausea and dizziness  ? Penicillins Other (See Comments)  ? REACTION: has family hx ? If ever had a reaction ?Has patient had a PCN reaction causing immediate rash, facial/tongue/throat swelling, SOB or lightheadedness with hypotension: No ?Has patient had a PCN reaction causing severe rash involving mucus membranes or skin necrosis: No ?Has patient had a PCN reaction that required hospitalization: No ?Has patient had a PCN reaction occurring within the last 10 years: No ?If all of the above answers are "NO", then may proceed with Cephalosporin use.  ? Quinapril Hcl Cough  ? Tetracycline Hcl Nausea And Vomiting, Other (See Comments)  ? Vomiting with illness  ? Amoxicillin Other (See Comments)  ? REACTION: has family hx ? If ever had a reaction ?Has patient had a PCN reaction causing immediate rash, facial/tongue/throat swelling, SOB or lightheadedness with hypotension: No ?Has patient had a PCN reaction causing severe rash involving mucus membranes or skin necrosis: No ?Has patient had a PCN reaction that required hospitalization: No ?Has patient had a PCN reaction occurring within the last 10 years: No ?If all of the above answers are "NO", then may proceed with Cephalosporin use.  ? ?  ? ?  ?Medication List  ?  ? ?STOP taking these medications   ? ?hydrochlorothiazide 25 MG tablet ?Commonly known as: HYDRODIURIL ?  ?potassium chloride 10 MEQ tablet ?Commonly known as: KLOR-CON ?  ? ?  ? ?TAKE these medications   ? ?aspirin EC 81 MG tablet ?Take 1 tablet (81 mg total) by mouth daily with breakfast. ?   ?clopidogrel 75 MG tablet ?Commonly known as: PLAVIX ?Take 1 tablet (75 mg total) by mouth daily with breakfast. ?  ?isosorbide mononitrate 30 MG 24 hr tablet ?Commonly known as: IMDUR ?Take 1 tablet (30 mg total) by mouth daily. ?  ?latanoprost 0.005 % ophthalmic solution ?Commonly known as: XALATAN ?Place 1 drop into both eyes at bedtime. ?  ?metoprolol tartrate 25 MG tablet ?Commonly known as: LOPRESSOR ?Take 1 tablet (25 mg total) by mouth See admin instructions. Take one and one-half tablets by mouth 2 times daily ?  ?nitroGLYCERIN 0.4 MG SL tablet ?Commonly known as: Nitrostat ?Place 1 tablet (0.4 mg total) under the tongue every 5 (five) minutes as needed for chest pain. ?  ?olmesartan 40 MG tablet ?Commonly known as: BENICAR ?Take 1 tablet (40 mg total) by mouth daily. ?What changed: when to take this ?  ?ondansetron 8 MG tablet ?Commonly known as: Zofran ?Take 1 tablet (8 mg total) by mouth every 6 (six) hours as needed for nausea or vomiting. ?  ?rosuvastatin 40 MG tablet ?Commonly known as: CRESTOR ?Take 1 tablet (40 mg total) by mouth daily. ?  ? ?  ? ? ?Discharge Assessment: ?Vitals:  ? 12/20/21 1000 12/20/21 1324  ?BP: 122/64 118/74  ?Pulse: 79 68  ?Resp: 18 20  ?Temp: 98.3 ?F (36.8 ?C) 97.7 ?F (36.5 ?C)  ?SpO2: 99% 98%  ? Skin clean, dry and intact without evidence of skin break down, no evidence of skin tears noted. ?IV catheter  discontinued intact. Site without signs and symptoms of complications - no redness or edema noted at insertion site, patient denies c/o pain - only slight tenderness at site.  Dressing with slight pressure applied. ? ?D/c Instructions-Education: ?Discharge instructions given to patient/family with verbalized understanding. ?D/c education completed with patient/family including follow up instructions, medication list, d/c activities limitations if indicated, with other d/c instructions as indicated by MD - patient able to verbalize understanding, all questions fully  answered. ?Patient instructed to return to ED, call 911, or call MD for any changes in condition.  ?Patient escorted via [place], and D/C home via private auto. ? ?Clovis Fredrickson, LPN ?11/19/4626 6:38 PM  ?

## 2021-12-20 NOTE — TOC Progression Note (Signed)
Transition of Care (TOC) - Progression Note  ? ? ?Patient Details  ?Name: Bryan Cabrera ?MRN: [id number] ?Date of Birth: 05-Feb-1941 ? ?Transition of Care (TOC) CM/SW Contact  ?Salome Arnt, LCSW ?Phone Number: ?12/20/2021, 11:14 AM ? ?Clinical Narrative:   ?Transition of Care (TOC) Screening Note ? ? ?Patient Details  ?Name: Bryan Cabrera ?Date of Birth: February 08, 1941 ? ? ?Transition of Care (TOC) CM/SW Contact:    ?Salome Arnt, LCSW ?Phone Number: ?12/20/2021, 11:14 AM ? ? ? ?Transition of Care Department [facility]) has reviewed patient and no TOC needs have been identified at this time. We will continue to monitor patient advancement through interdisciplinary progression rounds. If new patient transition needs arise, please place a TOC consult. ?   ? ? ? ?  ?Barriers to Discharge: Continued Medical Work up ? ?Expected Discharge Plan and Services ?  ?  ?  ?  ?  ?                ?  ?  ?  ?  ?  ?  ?  ?  ?  ?  ? ? ?Social Determinants of Health (SDOH) Interventions ?  ? ?Readmission Risk Interventions ?No flowsheet data found. ? ?

## 2021-12-20 NOTE — Progress Notes (Signed)
*  PRELIMINARY RESULTS* ?Echocardiogram ?2D Echocardiogram has been performed. ? ?Bryan Cabrera ?12/20/2021, 9:40 AM ?

## 2021-12-20 NOTE — Plan of Care (Signed)
?  Problem: Self-Care: ?Goal: Ability to participate in self-care as condition permits will improve ?Outcome: Progressing ?Goal: Ability to communicate needs accurately will improve ?Outcome: Progressing ?  ?Problem: Nutrition: ?Goal: Risk of aspiration will decrease ?Outcome: Progressing ?  ?Problem: Ischemic Stroke/TIA Tissue Perfusion: ?Goal: Complications of ischemic stroke/TIA will be minimized ?Outcome: Progressing ?  ?

## 2021-12-21 ENCOUNTER — Telehealth: Payer: Self-pay

## 2021-12-21 NOTE — Telephone Encounter (Signed)
Transition Care Management Follow-up Telephone Call ?Date of discharge and from where: [facility] 12-20-21 Dx: TIA ?How have you been since you were released from the hospital? Feeling ok  ?Any questions or concerns? No ? ?Items Reviewed: ?Did the pt receive and understand the discharge instructions provided? Yes  ?Medications obtained and verified? Yes  ?Other? No  ?Any new allergies since your discharge? No  ?Dietary orders reviewed? Yes ?Do you have support at home? Yes  ? ?Home Care and Equipment/Supplies: ?Were home health services ordered? no ?If so, what is the name of the agency? na  ?Has the agency set up a time to come to the patient's home? not applicable ?Were any new equipment or medical supplies ordered?  No ?What is the name of the medical supply agency? na ?Were you able to get the supplies/equipment? no ?Do you have any questions related to the use of the equipment or supplies? No ? ?Functional Questionnaire: (I = Independent and D = Dependent) ?ADLs: I ? ?Bathing/Dressing- I ? ?Meal Prep- I ? ?Eating- I ? ?Maintaining continence- I ? ?Transferring/Ambulation- I ? ?Managing Meds- I ? ?Follow up appointments reviewed: ? ?PCP Hospital f/u appt confirmed? Yes  Scheduled to see Dr Regis Bill on 12-26-21 @ 130pm. ?[facility] f/u appt confirmed? Yes  Scheduled to see Dr [name] on 12-28-21 @ 10am. ?Are transportation arrangements needed? No  ?If their condition worsens, is the pt aware to call PCP or go to the Emergency Dept.? Yes ?Was the patient provided with contact information for the PCP's office or ED? Yes ?Was to pt encouraged to call back with questions or concerns? Yes  ?

## 2021-12-22 ENCOUNTER — Telehealth (HOSPITAL_COMMUNITY): Payer: Self-pay

## 2021-12-22 ENCOUNTER — Other Ambulatory Visit (HOSPITAL_COMMUNITY): Payer: Self-pay

## 2021-12-22 ENCOUNTER — Other Ambulatory Visit: Payer: Self-pay | Admitting: Internal Medicine

## 2021-12-22 NOTE — Telephone Encounter (Signed)
Pharmacy Transitions of Care Follow-up Telephone Call ? ?Date of discharge: 12/20/21  ?Discharge Diagnosis: TIA ? ?How have you been since you were released from the hospital? Patient needs isosorbide refilled. Called local pharmacy and asked for them to refill isosorbide when they were given the transfers from [facility]. Patient felt weak and a little dizzy yesterday but is doing better today. Instructed patient to call MD if dizziness or weakness gets worse. No questions about meds at this itime. ? ?Medication changes made at discharge: ? - START: Clopidogrel ? ?Medication changes verified by the patient? Yes ?  ? ?Medication Accessibility: ? ?Home Pharmacy: [place] [place]  ? ?Was the patient provided with refills on discharged medications? Yes  ? ?Have all prescriptions been transferred from [facility] to home pharmacy? Ys  ? ?Is the patient able to afford medications? Has insurance ?  ? ?Medication Review: ? ?CLOPIDOGREL (PLAVIX) ?Clopidogrel 75 mg once daily.  ?- Educated patient on expected duration of therapy of ASA with clopidogrel.  ?- Advised patient of medications to avoid (NSAIDs, ASA)  ?- Educated that Tylenol (acetaminophen) will be the preferred analgesic to prevent risk of bleeding  ?- Emphasized importance of monitoring for signs and symptoms of bleeding (abnormal bruising, prolonged bleeding, nose bleeds, bleeding from gums, discolored urine, black tarry stools)  ?- Advised patient to alert all providers of anticoagulation therapy prior to starting a new medication or having a procedure  ? ?Follow-up Appointments: ? ?PCP Hospital f/u appt confirmed? Scheduled to see Dr. Regis Bill on 12/26/21 @ 1:30pm.  ? ?Specialist Hospital f/u appt confirmed? Scheduled to see Dr. [name] on 12/28/21 @ 10:00am.  ? ?If their condition worsens, is the pt aware to call PCP or go to the Emergency Dept.? Yes ? ?Final Patient Assessment: ?Patient has follow up scheduled and refills at home pharmacy ? ?

## 2021-12-26 ENCOUNTER — Ambulatory Visit: Payer: Medicare PPO | Admitting: Internal Medicine

## 2021-12-26 ENCOUNTER — Encounter: Payer: Self-pay | Admitting: Internal Medicine

## 2021-12-26 VITALS — BP 138/70 | HR 54 | Temp 98.2°F | Ht 68.0 in | Wt 206.8 lb

## 2021-12-26 DIAGNOSIS — I2510 Atherosclerotic heart disease of native coronary artery without angina pectoris: Secondary | ICD-10-CM

## 2021-12-26 DIAGNOSIS — I4819 Other persistent atrial fibrillation: Secondary | ICD-10-CM | POA: Diagnosis not present

## 2021-12-26 DIAGNOSIS — Z8673 Personal history of transient ischemic attack (TIA), and cerebral infarction without residual deficits: Secondary | ICD-10-CM | POA: Diagnosis not present

## 2021-12-26 DIAGNOSIS — Z79899 Other long term (current) drug therapy: Secondary | ICD-10-CM

## 2021-12-26 DIAGNOSIS — Z955 Presence of coronary angioplasty implant and graft: Secondary | ICD-10-CM

## 2021-12-26 DIAGNOSIS — Z09 Encounter for follow-up examination after completed treatment for conditions other than malignant neoplasm: Secondary | ICD-10-CM
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
## 2021-12-26 NOTE — Patient Instructions (Addendum)
Good  to see you today . ? ?I think the risk of  diabetes is  minimal compared to  risk of   heart attack stroke  and thus statin medicine is helpful .  There may be other  medications that can help.   Plan   to discuss with Dr [name] /card team. ? ?WIll send message to dr Tomi Likens and  [facility] about neuro follow up. Soon. ? ?No change  in  medication  at this time.  ? ?Agree no work until all  is stable.  ? ? ? ? ?
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
## 2021-12-28 ENCOUNTER — Ambulatory Visit: Payer: Medicare PPO | Admitting: Cardiology

## 2021-12-28 ENCOUNTER — Encounter: Payer: Self-pay | Admitting: Cardiology

## 2021-12-28 ENCOUNTER — Other Ambulatory Visit: Payer: Self-pay

## 2021-12-28 DIAGNOSIS — G459 Transient cerebral ischemic attack, unspecified: Secondary | ICD-10-CM

## 2021-12-28 DIAGNOSIS — I4819 Other persistent atrial fibrillation: Secondary | ICD-10-CM

## 2021-12-28 DIAGNOSIS — E7800 Pure hypercholesterolemia, unspecified: Secondary | ICD-10-CM

## 2021-12-28 DIAGNOSIS — I25110 Atherosclerotic heart disease of native coronary artery with unstable angina pectoris: Secondary | ICD-10-CM | POA: Diagnosis not present

## 2021-12-28 MED ORDER — APIXABAN 5 MG PO TABS: 5.0000 mg | ORAL_TABLET | Freq: Two times a day (BID) | ORAL | 6 refills | Status: DC

## 2021-12-28 MED ORDER — ROSUVASTATIN CALCIUM 20 MG PO TABS: 20.0000 mg | ORAL_TABLET | Freq: Every day | ORAL | 3 refills | Status: DC

## 2021-12-28 NOTE — Assessment & Plan Note (Signed)
RCA stent placement 12/13/2021, Dr. Ellyn Hack.  Excellent result.  Showed him the films. ?-Continue with goal-directed medical therapy ?- We will stop his aspirin 81 mg, continue Plavix 75 mg and start Eliquis 5 mg twice a day for his persistent atrial fibrillation. ?Understands risks of bleeding.  Continue to monitor.  Watch his left hip which previously had a large hematoma after a fall.  Currently is stable.  This was a while back.  No anginal symptoms currently. ?

## 2021-12-28 NOTE — Assessment & Plan Note (Signed)
Resuming Eliquis 5 mg twice a day.  Stopping aspirin.  Watch for any signs of bleeding.  Dr. Quentin Ore, consider watchman down the road.  Recent stent placement in February 2023. ?

## 2021-12-28 NOTE — Assessment & Plan Note (Signed)
Previous slurring of speech.  We are going to resume his Eliquis 5 mg twice a day for stroke prophylaxis in the setting of atrial fibrillation.  Eventually, Watchman device will be helpful. ?

## 2021-12-28 NOTE — Assessment & Plan Note (Signed)
We discussed the importance of utilizing Crestor.  We will go ahead and start him on Crestor 20 mg a day.  He had read the 2 page handout from Crestor in the hospital and he did not take it because he was worried about the development of diabetes.  I explained that the benefits of Crestor outweigh the small increase in blood glucose that can be associated with it.  I echoed Dr. Regis Bill with him taking the medication.  We will go ahead and start Crestor 20 mg.  Watch for any signs of myalgias. ?

## 2021-12-28 NOTE — Patient Instructions (Addendum)
Medication Instructions:  ?Please discontinue your Aspirin. ?Start Eliquis 5 mg one tablet twice a day and Crestor 20 mg a day. ?Continue all other medications as listed. ? ?*If you need a refill on your cardiac medications before your next appointment, please call your pharmacy* ? ?Follow-Up: ?At [facility], you and your health needs are our priority.  As part of our continuing mission to provide you with exceptional heart care, we have created designated Provider Care Teams.  These Care Teams include your primary Cardiologist (physician) and Advanced Practice Providers (APPs -  Physician Assistants and Nurse Practitioners) who all work together to provide you with the care you need, when you need it. ? ?We recommend signing up for the patient portal called "MyChart".  Sign up information is provided on this After Visit Summary.  MyChart is used to connect with patients for Virtual Visits (Telemedicine).  Patients are able to view lab/test results, encounter notes, upcoming appointments, etc.  Non-urgent messages can be sent to your provider as well.   ?To learn more about what you can do with MyChart, go to [email or web address].   ? ?Your next appointment:   ?3 month(s) ? ?The format for your next appointment:   ?In Person ? ?Provider:   ?Nicholes Rough, [name], Melina Copa, [name], Ermalinda Barrios, [name], Christen Bame, NP, or Richardson Dopp, [name]     { ? ? ? ?Thank you for choosing [facility]!! ? ? ? ?
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
## 2022-01-02 ENCOUNTER — Encounter: Payer: Self-pay | Admitting: Neurology

## 2022-01-02 ENCOUNTER — Other Ambulatory Visit: Payer: Self-pay

## 2022-01-02 ENCOUNTER — Ambulatory Visit: Payer: Medicare PPO | Admitting: Neurology

## 2022-01-02 VITALS — BP 147/76 | HR 56 | Ht 68.0 in | Wt 206.2 lb

## 2022-01-02 DIAGNOSIS — I10 Essential (primary) hypertension: Secondary | ICD-10-CM | POA: Diagnosis not present

## 2022-01-02 DIAGNOSIS — I4819 Other persistent atrial fibrillation: Secondary | ICD-10-CM

## 2022-01-02 DIAGNOSIS — I25110 Atherosclerotic heart disease of native coronary artery with unstable angina pectoris: Secondary | ICD-10-CM | POA: Diagnosis not present

## 2022-01-02 DIAGNOSIS — G459 Transient cerebral ischemic attack, unspecified: Secondary | ICD-10-CM | POA: Diagnosis not present

## 2022-01-02 DIAGNOSIS — E7800 Pure hypercholesterolemia, unspecified: Secondary | ICD-10-CM | POA: Diagnosis not present

## 2022-01-02 NOTE — Patient Instructions (Addendum)
Continue Plavix and Eliquis as per Dr. [name] ?Continue rosuvastatin ?Continue blood pressure medications ?

## 2022-01-09 ENCOUNTER — Telehealth: Payer: Self-pay | Admitting: Cardiology

## 2022-01-09 NOTE — Telephone Encounter (Signed)
Left message for patient to call back  

## 2022-01-09 NOTE — Progress Notes (Signed)
Code stroke ct ?5409 call ?1728 beeper ?1744 exam started ?1745 exam finished ?1745 images sent to soc ?1747 exam completed ?1747 [place] rad called ? ?

## 2022-01-09 NOTE — Telephone Encounter (Signed)
Pt c/o medication issue: ? ?1. Name of Medication: apixaban (ELIQUIS) 5 MG TABS tablet and clopidogrel (PLAVIX) 75 MG tablet ? ?2. How are you currently taking this medication (dosage and times per day)? Taking plavix as directed, has not started eliquis ? ?3. Are you having a reaction (difficulty breathing--STAT)? no ? ?4. What is your medication issue? Patient states that he needs clarification on these two medications. He believes he was told to stop plavix when it ran out and then start eliquis. If that is the case, he is almost out of plavix and will need a refill of eliquis sent to [facility]. Please advise.   ?

## 2022-01-10 ENCOUNTER — Ambulatory Visit: Payer: Medicare PPO | Admitting: Internal Medicine

## 2022-01-11 NOTE — Telephone Encounter (Signed)
Attempted to call patient. Unable to leave voicemail.  

## 2022-01-13 NOTE — Telephone Encounter (Signed)
Called pt unable to leave a voice mail.  Mailbox is not set up.   ?

## 2022-01-17 NOTE — Telephone Encounter (Signed)
From 12/28/2021 Derl Barrow office visit note: ? ?Coronary artery disease involving native coronary artery of native heart with unstable angina pectoris ([place]) ?RCA stent placement 12/13/2021, Dr. Ellyn Hack.  Excellent result.  Showed him the films. ?-Continue with goal-directed medical therapy ?- We will stop his aspirin 81 mg, continue Plavix 75 mg and start Eliquis 5 mg twice a day for his persistent atrial fibrillation. ?Understands risks of bleeding.  Continue to monitor.  Watch his left hip which previously had a large hematoma after a fall.  Currently is stable.  This was a while back.  No anginal symptoms currently. ?  ? ? ?Pt aware to continue plavix and eliquis both.  He reports he did miss one dose of plavix but has had it refilled and restarted it.  He will call back with any further questions or concerns.   ?

## 2022-01-19 ENCOUNTER — Telehealth: Payer: Self-pay | Admitting: Internal Medicine

## 2022-01-19 DIAGNOSIS — H401121 Primary open-angle glaucoma, left eye, mild stage: Secondary | ICD-10-CM | POA: Diagnosis not present

## 2022-01-19 NOTE — Telephone Encounter (Signed)
Left message for patient to call back and schedule Medicare Annual Wellness Visit (AWV) either virtually or in office. Left  my jabber number [phone]   Last AWV ; 04/13/17 please schedule at anytime with [facility] Nurse Health Advisor 1 or 2   

## 2022-01-23 ENCOUNTER — Other Ambulatory Visit: Payer: Self-pay | Admitting: Internal Medicine

## 2022-01-31 ENCOUNTER — Telehealth: Payer: Self-pay | Admitting: Internal Medicine

## 2022-01-31 ENCOUNTER — Ambulatory Visit
Admission: EM | Admit: 2022-01-31 | Discharge: 2022-01-31 | Disposition: A | Discharge status: Home / Self Care | Payer: Medicare PPO | Attending: Family Medicine | Admitting: Family Medicine

## 2022-01-31 DIAGNOSIS — R21 Rash and other nonspecific skin eruption: Secondary | ICD-10-CM | POA: Diagnosis not present

## 2022-01-31 DIAGNOSIS — M109 Gout, unspecified: Secondary | ICD-10-CM

## 2022-01-31 MED ORDER — TRIAMCINOLONE ACETONIDE 0.1 % EX CREA: 1.0000 "application " | TOPICAL_CREAM | Freq: Two times a day (BID) | CUTANEOUS | 0 refills | Status: DC

## 2022-01-31 MED ORDER — PREDNISONE 20 MG PO TABS: 40.0000 mg | ORAL_TABLET | Freq: Every day | ORAL | 0 refills | Status: DC

## 2022-01-31 MED ORDER — COLCHICINE 0.6 MG PO TABS: ORAL_TABLET | ORAL | 0 refills | Status: DC

## 2022-01-31 NOTE — ED Triage Notes (Signed)
Pt states he has a HX of gout in right foot but today he feels like it is in his left toe ? ?Pt states he called his PCP but they didn't have any openings today ? ?Pt states he took 2 Tylenol and that helped a little bit ?

## 2022-01-31 NOTE — Telephone Encounter (Signed)
Patent called in in  regards to gout in both feet. Patient would love to be added to [facility] schedule for today  ?Patient also stated that Dr Regis Bill informed him that anytime that he needed to be added she would.  I informed patient that I am not able to make that call and I would reach out to her CMA and the CMA would reach out to him  ?

## 2022-01-31 NOTE — Telephone Encounter (Signed)
Last Ov 12/26/21 ?Please advise ?

## 2022-02-03 NOTE — Telephone Encounter (Signed)
Left voicemail for patient to call the office 
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
## 2022-02-14 ENCOUNTER — Ambulatory Visit: Payer: Medicare PPO | Admitting: Internal Medicine

## 2022-02-14 ENCOUNTER — Encounter: Payer: Self-pay | Admitting: Internal Medicine

## 2022-02-14 VITALS — BP 130/80 | HR 44 | Temp 97.5°F | Ht 68.0 in | Wt 200.8 lb

## 2022-02-14 DIAGNOSIS — Z955 Presence of coronary angioplasty implant and graft: Secondary | ICD-10-CM | POA: Diagnosis not present

## 2022-02-14 DIAGNOSIS — I2510 Atherosclerotic heart disease of native coronary artery without angina pectoris: Secondary | ICD-10-CM | POA: Diagnosis not present

## 2022-02-14 DIAGNOSIS — E790 Hyperuricemia without signs of inflammatory arthritis and tophaceous disease: Secondary | ICD-10-CM

## 2022-02-14 DIAGNOSIS — Z79899 Other long term (current) drug therapy: Secondary | ICD-10-CM | POA: Diagnosis not present

## 2022-02-14 DIAGNOSIS — I10 Essential (primary) hypertension: Secondary | ICD-10-CM

## 2022-02-14 DIAGNOSIS — R0600 Dyspnea, unspecified: Secondary | ICD-10-CM

## 2022-02-14 DIAGNOSIS — Z8739 Personal history of other diseases of the musculoskeletal system and connective tissue: Secondary | ICD-10-CM

## 2022-02-14 MED ORDER — COLCHICINE 0.6 MG PO TABS: ORAL_TABLET | ORAL | 0 refills | Status: DC

## 2022-02-14 MED ORDER — PREDNISONE 20 MG PO TABS: 40.0000 mg | ORAL_TABLET | Freq: Every day | ORAL | 0 refills | Status: DC

## 2022-02-14 NOTE — Patient Instructions (Addendum)
Good to see you today  pulse is 54 today  ?Check pulse and bp when feeling  short of breath.   If  pulse over 120 or below 50 and feel badly contact medical  team.  ? ?I will send info to  Cards  to  involve them . .  ? ?Will send in prednisone  and colchicine  to your pharmacy that can be on hand if get another gout attack.  ? ?But if have to use then contact us for follow up ?If recurrent consider   adding controller medication allopurinol to reduce uric acid level  and prevent attacks. ? ?Plan rov in  4-6 months or as indicated .  ?
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
## 2022-02-18 ENCOUNTER — Other Ambulatory Visit: Payer: Self-pay | Admitting: Internal Medicine

## 2022-03-22 ENCOUNTER — Telehealth: Payer: Self-pay | Admitting: Internal Medicine

## 2022-03-22 ENCOUNTER — Other Ambulatory Visit: Payer: Self-pay | Admitting: Internal Medicine

## 2022-03-22 NOTE — Telephone Encounter (Signed)
Left message for patient to call back and schedule Medicare Annual Wellness Visit (AWV) either virtually or in office. Left  my jabber number [phone]   Last AWV ; 04/13/17 please schedule at anytime with [facility] Nurse Health Advisor 1 or 2   

## 2022-03-27 ENCOUNTER — Telehealth: Payer: Self-pay | Admitting: Cardiology

## 2022-03-27 ENCOUNTER — Encounter: Payer: Self-pay | Admitting: Cardiology

## 2022-03-27 NOTE — Telephone Encounter (Signed)
Called patient back about message. Patient having SOB with and without activity, and elevated BP. Patient complained about blurred vision last night, but eye drops helped. Informed patient of signs and symptoms of a stroke and when to call 911. Patient is currently taking all his cardiac medications, benicar 40 mg daily, metoprolol 37.5 mg BID, Imdur 30 mg daily, and colchicine 0.6 mg daily. Patient stated his BP is all over the place. Encouraged patient to reduce salt intake and to read labels. Made patient an appointment to see DOD tomorrow for evaluation. Encouraged patient to go to the ED if his SOB gets worse. Will forward to Dr. [name] for further advisement.

## 2022-03-27 NOTE — Telephone Encounter (Signed)
Pt c/o Shortness Of Breath: STAT if SOB developed within the last 24 hours or pt is noticeably SOB on the phone  1. Are you currently SOB (can you hear that pt is SOB on the phone)? no  2. How long have you been experiencing SOB? For the past two days  3. Are you SOB when sitting or when up moving around? both  4. Are you currently experiencing any other symptoms? Patient states he is feeling kind of weird today.  States his BP is 185/105   Pt c/o BP issue: STAT if pt c/o blurred vision, one-sided weakness or slurred speech  1. What are your last 5 BP readings?  185/105 15 mins ago 133/76 10am this morning 145/78  64    yesterday  172/96 70  2. Are you having any other symptoms (ex. Dizziness, headache, blurred vision, passed out)? Patient states he is feeling dizzy.  Last night he was having a little bit of blurry vision.   3. What is your BP issue? BP is a little elevated for him.
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
## 2022-03-28 ENCOUNTER — Ambulatory Visit: Payer: Medicare PPO | Admitting: Physician Assistant

## 2022-03-28 ENCOUNTER — Encounter: Payer: Self-pay | Admitting: Cardiovascular Disease

## 2022-03-28 ENCOUNTER — Ambulatory Visit: Payer: Medicare PPO | Admitting: Cardiovascular Disease

## 2022-03-28 VITALS — BP 102/64 | HR 54 | Ht 68.5 in | Wt 200.0 lb

## 2022-03-28 DIAGNOSIS — I25110 Atherosclerotic heart disease of native coronary artery with unstable angina pectoris: Secondary | ICD-10-CM

## 2022-03-28 DIAGNOSIS — I10 Essential (primary) hypertension: Secondary | ICD-10-CM | POA: Diagnosis not present

## 2022-03-28 DIAGNOSIS — I480 Paroxysmal atrial fibrillation: Secondary | ICD-10-CM | POA: Diagnosis not present

## 2022-03-28 DIAGNOSIS — G459 Transient cerebral ischemic attack, unspecified: Secondary | ICD-10-CM

## 2022-03-28 NOTE — Patient Instructions (Signed)
Medication Instructions:  Your physician recommends that you continue on your current medications as directed. Please refer to the Current Medication list given to you today.  *If you need a refill on your cardiac medications before your next appointment, please call your pharmacy*  Lab Work: Your physician recommends that you have lab work today- BMET and BNP  If you have labs (blood work) drawn today and your tests are completely normal, you will receive your results only by: MyChart Message (if you have [place]) OR A paper copy in the mail If you have any lab test that is abnormal or we need to change your treatment, we will call you to review the results.  Testing/Procedures: Your physician has requested that you have cardiac CT. Cardiac computed tomography (CT) is a painless test that uses an x-ray machine to take clear, detailed pictures of your heart. For further information please visit [email or web address].[email or web address]. Please follow instruction sheet as given.  Follow-Up: At [facility], you and your health needs are our priority.  As part of our continuing mission to provide you with exceptional heart care, we have created designated Provider Care Teams.  These Care Teams include your primary Cardiologist (physician) and Advanced Practice Providers (APPs -  Physician Assistants and Nurse Practitioners) who all work together to provide you with the care you need, when you need it.  We recommend signing up for the patient portal called "MyChart".  Sign up information is provided on this After Visit Summary.  MyChart is used to connect with patients for Virtual Visits (Telemedicine).  Patients are able to view lab/test results, encounter notes, upcoming appointments, etc.  Non-urgent messages can be sent to your provider as well.   To learn more about what you can do with MyChart, go to [email or web address].    Your next appointment:   As scheduled  The format for your next appointment:    In Person  Provider:   [name], MD {   Important Information About Sugar

## 2022-03-29 LAB — BASIC METABOLIC PANEL
BUN/Creatinine Ratio: 16 (ref 10–24)
BUN: 19 mg/dL (ref 8–27)
CO2: 22 mmol/L (ref 20–29)
Calcium: 10.3 mg/dL — ABNORMAL HIGH (ref 8.6–10.2)
Chloride: 109 mmol/L — ABNORMAL HIGH (ref 96–106)
Creatinine, Ser: 1.21 mg/dL (ref 0.76–1.27)
Glucose: 102 mg/dL — ABNORMAL HIGH (ref 70–99)
Potassium: 4.5 mmol/L (ref 3.5–5.2)
Sodium: 147 mmol/L — ABNORMAL HIGH (ref 134–144)
eGFR: 60 mL/min/{1.73_m2} (ref >59)

## 2022-03-29 LAB — PRO B NATRIURETIC PEPTIDE: NT-Pro BNP: 2453 pg/mL — ABNORMAL HIGH (ref 0–486)

## 2022-03-30 ENCOUNTER — Telehealth: Payer: Self-pay

## 2022-03-30 NOTE — Telephone Encounter (Signed)
Left message for patient to call back  

## 2022-03-30 NOTE — Telephone Encounter (Signed)
-----   Message from Josue Hector, MD sent at 03/29/2022  8:05 AM EDT ----- BNP elevated start lasix 20 mg daily repeat BMET in 10 days needs f/u with Skains/Lambert

## 2022-04-06 ENCOUNTER — Telehealth: Payer: Self-pay

## 2022-04-06 DIAGNOSIS — Z79899 Other long term (current) drug therapy: Secondary | ICD-10-CM

## 2022-04-06 MED ORDER — FUROSEMIDE 20 MG PO TABS: 20.0000 mg | ORAL_TABLET | Freq: Every day | ORAL | 3 refills | Status: DC

## 2022-04-06 NOTE — Telephone Encounter (Signed)
-----   Message from Josue Hector, MD sent at 03/29/2022  8:05 AM EDT ----- BNP elevated start lasix 20 mg daily repeat BMET in 10 days needs f/u with Skains/Lambert

## 2022-04-06 NOTE — Telephone Encounter (Signed)
Was able to finally get a hold of patient to let him know about his lab results. Will send in lasix to patient's pharmacy. Patient stated he does not know if he will start medication and will consult his PCP first. Patient's most recent vitals BP 155/43 and HR 67. Patient will keep his up coming appointment with Dr. Johnsie Cancel.

## 2022-04-17 DIAGNOSIS — Z79899 Other long term (current) drug therapy: Secondary | ICD-10-CM | POA: Diagnosis not present

## 2022-04-18 LAB — BASIC METABOLIC PANEL
BUN/Creatinine Ratio: 13 (ref 10–24)
BUN: 14 mg/dL (ref 8–27)
CO2: 22 mmol/L (ref 20–29)
Calcium: 10.5 mg/dL — ABNORMAL HIGH (ref 8.6–10.2)
Chloride: 105 mmol/L (ref 96–106)
Creatinine, Ser: 1.06 mg/dL (ref 0.76–1.27)
Glucose: 121 mg/dL — ABNORMAL HIGH (ref 70–99)
Potassium: 4 mmol/L (ref 3.5–5.2)
Sodium: 142 mmol/L (ref 134–144)
eGFR: 71 mL/min/{1.73_m2} (ref >59)

## 2022-04-24 ENCOUNTER — Encounter: Payer: Self-pay | Admitting: Cardiology

## 2022-04-24 ENCOUNTER — Ambulatory Visit: Payer: Medicare PPO | Admitting: Cardiology

## 2022-04-24 DIAGNOSIS — R600 Localized edema: Secondary | ICD-10-CM

## 2022-04-24 DIAGNOSIS — I25110 Atherosclerotic heart disease of native coronary artery with unstable angina pectoris: Secondary | ICD-10-CM | POA: Diagnosis not present

## 2022-04-24 DIAGNOSIS — I4819 Other persistent atrial fibrillation: Secondary | ICD-10-CM

## 2022-04-24 NOTE — Assessment & Plan Note (Signed)
Agree with watchman work-up.

## 2022-04-24 NOTE — Patient Instructions (Signed)
Medication Instructions:  Your physician recommends that you continue on your current medications as directed. Please refer to the Current Medication list given to you today.  *If you need a refill on your cardiac medications before your next appointment, please call your pharmacy*   Follow-Up: At [facility], you and your health needs are our priority.  As part of our continuing mission to provide you with exceptional heart care, we have created designated Provider Care Teams.  These Care Teams include your primary Cardiologist (physician) and Advanced Practice Providers (APPs -  Physician Assistants and Nurse Practitioners) who all work together to provide you with the care you need, when you need it.   Your next appointment:   2 month(s)  The format for your next appointment:   In Person  Provider:   [name], MD

## 2022-04-24 NOTE — Assessment & Plan Note (Signed)
Recent prednisone, colchicine for gout.  Likely contributing to his ankle edema and 4 pound weight gain.  Continue with Lasix 20 mg a day.

## 2022-04-24 NOTE — Assessment & Plan Note (Signed)
Continue with current medication management.  No changes made.
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
## 2022-04-25 ENCOUNTER — Encounter: Payer: Self-pay | Admitting: Cardiology

## 2022-05-05 ENCOUNTER — Telehealth: Payer: Self-pay | Admitting: Internal Medicine

## 2022-05-05 NOTE — Telephone Encounter (Signed)
Left message for patient to call back and schedule Medicare Annual Wellness Visit (AWV) either virtually or in office. Left  my Bryan Cabrera number [phone]   Last AWV ;04/03/17  please schedule at anytime with [facility] Nurse Health Advisor 1 or 2

## 2022-05-19 NOTE — Telephone Encounter (Signed)
See phone note on 04/06/22.

## 2022-05-26 ENCOUNTER — Encounter (HOSPITAL_COMMUNITY): Payer: Self-pay

## 2022-05-31 ENCOUNTER — Other Ambulatory Visit: Payer: Self-pay | Admitting: Family Medicine

## 2022-06-01 ENCOUNTER — Other Ambulatory Visit: Payer: Self-pay | Admitting: Internal Medicine

## 2022-06-01 NOTE — Telephone Encounter (Signed)
provider not at this practice, will refuse medication.  Requested Prescriptions  Pending Prescriptions Disp Refills  . triamcinolone cream (KENALOG) 0.1 % [Pharmacy Med Name: TRIAMCINOLONE ACETON 0.1% TOP CREAM] 60 g 0    Sig: APPLY TOPICALLY TWO TIMES DAILY.     There is no refill protocol information for this order

## 2022-06-08 ENCOUNTER — Other Ambulatory Visit: Payer: Self-pay | Admitting: Internal Medicine

## 2022-06-09 ENCOUNTER — Ambulatory Visit: Payer: Medicare PPO | Admitting: Family Medicine

## 2022-06-09 ENCOUNTER — Encounter: Payer: Self-pay | Admitting: Family Medicine

## 2022-06-09 VITALS — BP 152/70 | HR 72 | Temp 98.0°F | Ht 68.5 in | Wt 205.0 lb

## 2022-06-09 DIAGNOSIS — I10 Essential (primary) hypertension: Secondary | ICD-10-CM

## 2022-06-09 MED ORDER — DILTIAZEM HCL ER COATED BEADS 120 MG PO CP24: 120.0000 mg | ORAL_CAPSULE | Freq: Every day | ORAL | 1 refills | Status: DC
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
## 2022-06-10 ENCOUNTER — Other Ambulatory Visit: Payer: Self-pay | Admitting: Student

## 2022-06-12 ENCOUNTER — Encounter (HOSPITAL_COMMUNITY): Payer: Self-pay

## 2022-06-12 ENCOUNTER — Other Ambulatory Visit: Payer: Self-pay

## 2022-06-12 ENCOUNTER — Telehealth: Payer: Self-pay | Admitting: Internal Medicine

## 2022-06-12 ENCOUNTER — Observation Stay (HOSPITAL_COMMUNITY)
Admission: EM | Admit: 2022-06-12 | Discharge: 2022-06-13 | Disposition: A | Discharge status: Home / Self Care | Payer: Medicare PPO | Attending: Internal Medicine | Admitting: Internal Medicine

## 2022-06-12 ENCOUNTER — Emergency Department (HOSPITAL_COMMUNITY): Payer: Medicare PPO

## 2022-06-12 DIAGNOSIS — Z8616 Personal history of COVID-19: Secondary | ICD-10-CM | POA: Insufficient documentation

## 2022-06-12 DIAGNOSIS — R531 Weakness: Secondary | ICD-10-CM | POA: Insufficient documentation

## 2022-06-12 DIAGNOSIS — Z7902 Long term (current) use of antithrombotics/antiplatelets: Secondary | ICD-10-CM | POA: Diagnosis not present

## 2022-06-12 DIAGNOSIS — Z87891 Personal history of nicotine dependence: Secondary | ICD-10-CM | POA: Insufficient documentation

## 2022-06-12 DIAGNOSIS — I4811 Longstanding persistent atrial fibrillation: Secondary | ICD-10-CM | POA: Diagnosis not present

## 2022-06-12 DIAGNOSIS — I160 Hypertensive urgency: Secondary | ICD-10-CM | POA: Diagnosis not present

## 2022-06-12 DIAGNOSIS — R0602 Shortness of breath: Secondary | ICD-10-CM | POA: Diagnosis not present

## 2022-06-12 DIAGNOSIS — I4891 Unspecified atrial fibrillation: Secondary | ICD-10-CM | POA: Diagnosis present

## 2022-06-12 DIAGNOSIS — Z8673 Personal history of transient ischemic attack (TIA), and cerebral infarction without residual deficits: Secondary | ICD-10-CM | POA: Diagnosis not present

## 2022-06-12 DIAGNOSIS — R001 Bradycardia, unspecified: Principal | ICD-10-CM

## 2022-06-12 DIAGNOSIS — I2510 Atherosclerotic heart disease of native coronary artery without angina pectoris: Secondary | ICD-10-CM

## 2022-06-12 DIAGNOSIS — I10 Essential (primary) hypertension: Secondary | ICD-10-CM | POA: Diagnosis not present

## 2022-06-12 DIAGNOSIS — I25110 Atherosclerotic heart disease of native coronary artery with unstable angina pectoris: Secondary | ICD-10-CM | POA: Insufficient documentation

## 2022-06-12 DIAGNOSIS — Z955 Presence of coronary angioplasty implant and graft: Secondary | ICD-10-CM | POA: Insufficient documentation

## 2022-06-12 DIAGNOSIS — N138 Other obstructive and reflux uropathy: Secondary | ICD-10-CM | POA: Diagnosis present

## 2022-06-12 DIAGNOSIS — E785 Hyperlipidemia, unspecified: Secondary | ICD-10-CM | POA: Diagnosis present

## 2022-06-12 DIAGNOSIS — Z7901 Long term (current) use of anticoagulants: Secondary | ICD-10-CM | POA: Diagnosis not present

## 2022-06-12 DIAGNOSIS — I5030 Unspecified diastolic (congestive) heart failure: Secondary | ICD-10-CM | POA: Insufficient documentation

## 2022-06-12 DIAGNOSIS — I110 Hypertensive heart disease with heart failure: Secondary | ICD-10-CM | POA: Diagnosis not present

## 2022-06-12 DIAGNOSIS — N401 Enlarged prostate with lower urinary tract symptoms: Secondary | ICD-10-CM | POA: Insufficient documentation

## 2022-06-12 DIAGNOSIS — I4819 Other persistent atrial fibrillation: Secondary | ICD-10-CM | POA: Diagnosis present

## 2022-06-12 DIAGNOSIS — G4733 Obstructive sleep apnea (adult) (pediatric): Secondary | ICD-10-CM | POA: Diagnosis present

## 2022-06-12 DIAGNOSIS — E782 Mixed hyperlipidemia: Secondary | ICD-10-CM | POA: Diagnosis present

## 2022-06-12 HISTORY — DX: Unspecified atrial fibrillation: I48.91

## 2022-06-12 LAB — CBC WITH DIFFERENTIAL/PLATELET
Abs Immature Granulocytes: 0.01 10*3/uL (ref 0.00–0.07)
Basophils Absolute: 0 10*3/uL (ref 0.0–0.1)
Basophils Relative: 1 %
Eosinophils Absolute: 0.2 10*3/uL (ref 0.0–0.5)
Eosinophils Relative: 4 %
HCT: 41.9 % (ref 39.0–52.0)
Hemoglobin: 14.4 g/dL (ref 13.0–17.0)
Immature Granulocytes: 0 %
Lymphocytes Relative: 29 %
Lymphs Abs: 1.7 10*3/uL (ref 0.7–4.0)
MCH: 28.7 pg (ref 26.0–34.0)
MCHC: 34.4 g/dL (ref 30.0–36.0)
MCV: 83.6 fL (ref 80.0–100.0)
Monocytes Absolute: 0.6 10*3/uL (ref 0.1–1.0)
Monocytes Relative: 11 %
Neutro Abs: 3.2 10*3/uL (ref 1.7–7.7)
Neutrophils Relative %: 55 %
Platelets: 213 10*3/uL (ref 150–400)
RBC: 5.01 MIL/uL (ref 4.22–5.81)
RDW: 13.7 % (ref 11.5–15.5)
WBC: 5.8 10*3/uL (ref 4.0–10.5)
nRBC: 0 % (ref 0.0–0.2)

## 2022-06-12 LAB — BRAIN NATRIURETIC PEPTIDE: B Natriuretic Peptide: 462 pg/mL — ABNORMAL HIGH (ref 0.0–100.0)

## 2022-06-12 LAB — BASIC METABOLIC PANEL
Anion gap: 8 (ref 5–15)
BUN: 17 mg/dL (ref 8–23)
CO2: 22 mmol/L (ref 22–32)
Calcium: 9.3 mg/dL (ref 8.9–10.3)
Chloride: 109 mmol/L (ref 98–111)
Creatinine, Ser: 0.97 mg/dL (ref 0.61–1.24)
GFR, Estimated: >60 mL/min (ref >60)
Glucose, Bld: 138 mg/dL — ABNORMAL HIGH (ref 70–99)
Potassium: 3.8 mmol/L (ref 3.5–5.1)
Sodium: 139 mmol/L (ref 135–145)

## 2022-06-12 LAB — TROPONIN I (HIGH SENSITIVITY): Troponin I (High Sensitivity): 6 ng/L (ref <18)

## 2022-06-12 MED ORDER — FUROSEMIDE 20 MG PO TABS
20.0000 mg | ORAL_TABLET | Freq: Every morning | ORAL | Status: DC
  Administered 2022-06-13: 20 mg via ORAL
  Filled 2022-06-12: qty 1

## 2022-06-12 MED ORDER — CLOPIDOGREL BISULFATE 75 MG PO TABS
75.0000 mg | ORAL_TABLET | Freq: Every day | ORAL | Status: DC
  Administered 2022-06-13: 75 mg via ORAL
  Filled 2022-06-12: qty 1

## 2022-06-12 MED ORDER — LATANOPROST 0.005 % OP SOLN
1.0000 [drp] | Freq: Every day | OPHTHALMIC | Status: DC
  Administered 2022-06-12: 1 [drp] via OPHTHALMIC
  Filled 2022-06-12: qty 2.5

## 2022-06-12 MED ORDER — IRBESARTAN 75 MG PO TABS
37.5000 mg | ORAL_TABLET | Freq: Every day | ORAL | Status: DC
  Administered 2022-06-12 – 2022-06-13 (×2): 37.5 mg via ORAL
  Filled 2022-06-12 (×2): qty 1

## 2022-06-12 MED ORDER — APIXABAN 5 MG PO TABS
5.0000 mg | ORAL_TABLET | Freq: Two times a day (BID) | ORAL | Status: DC
  Administered 2022-06-12 – 2022-06-13 (×2): 5 mg via ORAL
  Filled 2022-06-12 (×2): qty 1

## 2022-06-12 MED ORDER — ISOSORBIDE MONONITRATE ER 60 MG PO TB24
30.0000 mg | ORAL_TABLET | Freq: Every day | ORAL | Status: DC
  Administered 2022-06-13: 30 mg via ORAL
  Filled 2022-06-12: qty 1

## 2022-06-12 MED ORDER — HYDRALAZINE HCL 20 MG/ML IJ SOLN: 5.0000 mg | Freq: Four times a day (QID) | INTRAMUSCULAR | Status: DC | PRN
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
## 2022-06-12 NOTE — ED Triage Notes (Signed)
Patient states that he has been hypertensive at home with visit to PCP on Friday for new medication. States that he has been asymptomatic from it. Heart rate in 30's while at home due to Cardizem. Reports [facility].

## 2022-06-12 NOTE — Telephone Encounter (Signed)
Spoke with patient to schedule his AWV.    He stated he was not doing well.  He stated his bp is high and he has an erratic heart rate.  He stated his heart rate went down to 35 over the weekend.    I called triage @ [phone]  and spoke with Tristen before transferring patient
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
## 2022-06-12 NOTE — ED Notes (Signed)
Pt roomed to 19. PA Evalee Jefferson at bedside. Pt visibly short of breath, particularly on exertion. Denies complaints of chest pain, dizziness, nausea, vomiting, and loss of consciousness.

## 2022-06-12 NOTE — ED Notes (Signed)
Pt care taken, resting, no complaints at this time other than his med list need to be verified.

## 2022-06-12 NOTE — ED Provider Notes (Signed)
[place] [facility] EMERGENCY DEPARTMENT Provider Note   CSN: [id number] Arrival date & time: 06/12/22  1354     History  Chief Complaint  Patient presents with   Hypertension    Bryan Cabrera is a 81 y.o. male with a history including hypertension, sleep apnea, hyperglycemia, GERD, persistent A-fib on both Eliquis and Plavix and history of TIA presenting for evaluation of increased shortness of breath and fatigue, reporting heart rates into the 30s over this weekend.  He was seen by his primary clinic on Friday secondary to elevated blood pressures and he had been placed on Cardizem CD 120 mg daily.  He took 1 tablet Friday evening, then his second tablet Saturday morning.  He reported having low pulse rates, therefore skipped his Sunday dose, took his last Cardizem this morning and once again has a low pulse rate.  He endorses shortness of breath, denies chest pain, also denies peripheral edema, orthopnea and denies palpitations stating he does not recognize when he is in A-fib.  He denies pleuritic symptoms, cough, fevers or chills.  Also no nausea or vomiting, diaphoresis or dizziness.  The history is provided by the patient and medical records.       Home Medications Prior to Admission medications   Medication Sig Start Date End Date Taking? Authorizing Provider  apixaban (ELIQUIS) 5 MG TABS tablet Take 1 tablet (5 mg total) by mouth 2 (two) times daily. 12/28/21  Yes [name], MD  clopidogrel (PLAVIX) 75 MG tablet TAKE ONE TABLET (75 MG TOTAL) BY MOUTH DAILY WITH BREAKFAST. Patient taking differently: Take 75 mg by mouth daily. 06/12/22  Yes [name], MD  diltiazem (CARDIZEM CD) 120 MG 24 hr capsule Take 1 capsule (120 mg total) by mouth daily. 06/09/22  Yes Burchette, Alinda Sierras, MD  isosorbide mononitrate (IMDUR) 30 MG 24 hr tablet Take 1 tablet (30 mg total) by mouth daily. 03/22/22  Yes Panosh, Standley Brooking, MD  latanoprost (XALATAN) 0.005 % ophthalmic solution Place 1 drop into both  eyes at bedtime. 08/09/18  Yes Koberlein, Steele Berg, MD  metoprolol tartrate (LOPRESSOR) 25 MG tablet Take 1 tablet (25 mg total) by mouth See admin instructions. Take one and one-half tablets by mouth 2 times daily 11/25/21  Yes Emokpae, Courage, MD  nitroGLYCERIN (NITROSTAT) 0.4 MG SL tablet Place 1 tablet (0.4 mg total) under the tongue every 5 (five) minutes as needed for chest pain. 12/14/21 12/14/22 Yes Goodrich, [name], [name]  olmesartan (BENICAR) 40 MG tablet Take 1 tablet (40 mg total) by mouth daily. Patient taking differently: Take 40 mg by mouth every evening. 12/06/21  Yes [name], MD  ondansetron (ZOFRAN) 8 MG tablet Take 1 tablet (8 mg total) by mouth every 6 (six) hours as needed for nausea or vomiting. 11/04/21  Yes Laurey Morale, MD  rosuvastatin (CRESTOR) 20 MG tablet Take 1 tablet (20 mg total) by mouth daily. 12/28/21  Yes [name], MD      Allergies    Ciprofloxacin, Metronidazole, Penicillins, Quinapril hcl, Tetracycline hcl, and Amoxicillin    Review of Systems   Review of Systems  Constitutional:  Negative for diaphoresis and fever.  HENT:  Negative for congestion and sore throat.   Eyes: Negative.   Respiratory:  Positive for shortness of breath. Negative for cough and chest tightness.   Cardiovascular:  Negative for chest pain, palpitations and leg swelling.  Gastrointestinal:  Negative for abdominal pain, nausea and vomiting.  Genitourinary: Negative.  Musculoskeletal:  Negative for arthralgias, joint swelling and neck pain.  Skin: Negative.  Negative for rash and wound.  Neurological:  Negative for dizziness, weakness, light-headedness, numbness and headaches.  Psychiatric/Behavioral: Negative.    All other systems reviewed and are negative.   Physical Exam Updated Vital Signs BP 120/62   Pulse (!) 36   Temp 97.7 F (36.5 C) (Temporal)   Resp 15   SpO2 96%  Physical Exam Vitals and nursing note reviewed.  Constitutional:      Appearance: He  is well-developed.  HENT:     Head: Normocephalic and atraumatic.  Eyes:     Conjunctiva/sclera: Conjunctivae normal.  Cardiovascular:     Rate and Rhythm: Bradycardia present. Rhythm irregular.     Heart sounds: Normal heart sounds. No murmur heard.    Comments: Rate during exam 46. Pulmonary:     Effort: Pulmonary effort is normal.     Breath sounds: Normal breath sounds. No wheezing, rhonchi or rales.  Abdominal:     General: Bowel sounds are normal. There is no distension.     Palpations: Abdomen is soft.     Tenderness: There is no abdominal tenderness.  Musculoskeletal:        General: Normal range of motion.     Cervical back: Normal range of motion.     Right lower leg: No edema.     Left lower leg: No edema.  Skin:    General: Skin is warm and dry.  Neurological:     Mental Status: He is alert.     ED Results / Procedures / Treatments   Labs (all labs ordered are listed, but only abnormal results are displayed) Labs Reviewed  BASIC METABOLIC PANEL - Abnormal; Notable for the following components:      Result Value   Glucose, Bld 138 (*)    All other components within normal limits  BRAIN NATRIURETIC PEPTIDE - Abnormal; Notable for the following components:   B Natriuretic Peptide 462.0 (*)    All other components within normal limits  CBC WITH DIFFERENTIAL/PLATELET  TROPONIN I (HIGH SENSITIVITY)    EKG EKG Interpretation  Date/Time:  Monday June 12 2022 14:04:45 EDT Ventricular Rate:  49 PR Interval:    QRS Duration: 76 QT Interval:  480 QTC Calculation: 433 R Axis:   36 Text Interpretation: Atrial fibrillation with slow ventricular response Abnormal ECG When compared with ECG of 19-Dec-2021 17:56, PREVIOUS ECG IS PRESENT Since last tracing rate slower ectopic beats no longer seen Confirmed by Noemi Chapel [phone]) on 06/12/2022 2:09:20 PM  Radiology DG Chest Portable 1 View  Result Date: 06/12/2022 CLINICAL DATA:  Shortness of breath on exertion.  Hypertension. History of atrial fibrillation. EXAM: PORTABLE CHEST 1 VIEW COMPARISON:  11/24/2021 FINDINGS: Heart size is normal. Mediastinal shadows are normal. The lungs are clear. The vascularity is normal. No effusion. No abnormal bone finding. IMPRESSION: No active disease. Electronically Signed   By: Nelson Chimes M.D.   On: 06/12/2022 14:43    Procedures Procedures    Medications Ordered in ED Medications - No data to display  ED Course/ Medical Decision Making/ A&P                           Medical Decision Making Pt presenting with sob and symptomatic bradycardia, rates here consistently in the mid 30's to low 40 range.  He has no hypoxia and no tachypnea, no cp, but feels sob at  rest.  New medicine cardizem started 3 days ago, last dose around 8 am today.  Clinically he does not appear to have chf, no peripheral edema or rales, but he does have an elevated bnp at 462, cxr clear , therefore I do not feel this is fluid overload, but symptomatic bradycardia secondary to new cardizem prescription.  Although bp is well controlled here, last bp 120/62, pt does not appear to be able to tolerate this new med.  Also on lopressor and benicar.  4:13 PM  Current rate on monitor 36, afib.  No respiratory compromise with normal respiratory rate and pulse ox, but subjective sob.   Amount and/or Complexity of Data Reviewed Labs: ordered.    Details: Per above Radiology: ordered.    Details: No active cardiopulmonary disease on chest x-ray. ECG/medicine tests: ordered and independent interpretation performed.    Details: Chronic atrial fibrillation, bradycardia with a vent rate of 49. Discussion of management or test interpretation with external provider(s): Call placed to Dr. [name].  Discussed pt with Dr. [name] who suggests patient be admitted to hospitalist service.  Advised to hold the cardizem and also his metoprolol for now until meds wash out.  If remains symptomatic may need to consider  tachy brady syndrome/pacemaker.    Discussed with Dr. Verlon Au who accepts pt for admission.           Final Clinical Impression(s) / ED Diagnoses Final diagnoses:  Symptomatic bradycardia  Longstanding persistent atrial fibrillation [facility])    Rx / DC Orders ED Discharge Orders     None         Landis Martins 06/12/22 1714    Noemi Chapel, MD 06/13/22 [phone]

## 2022-06-12 NOTE — Hospital Course (Signed)
States that he took

## 2022-06-13 ENCOUNTER — Telehealth: Payer: Self-pay

## 2022-06-13 ENCOUNTER — Encounter (HOSPITAL_COMMUNITY): Payer: Self-pay | Admitting: Family Medicine

## 2022-06-13 DIAGNOSIS — I4811 Longstanding persistent atrial fibrillation: Secondary | ICD-10-CM

## 2022-06-13 DIAGNOSIS — I4891 Unspecified atrial fibrillation: Secondary | ICD-10-CM

## 2022-06-13 DIAGNOSIS — R001 Bradycardia, unspecified: Secondary | ICD-10-CM | POA: Diagnosis not present

## 2022-06-13 DIAGNOSIS — E782 Mixed hyperlipidemia: Secondary | ICD-10-CM

## 2022-06-13 DIAGNOSIS — I2510 Atherosclerotic heart disease of native coronary artery without angina pectoris: Secondary | ICD-10-CM

## 2022-06-13 DIAGNOSIS — I4819 Other persistent atrial fibrillation: Secondary | ICD-10-CM | POA: Diagnosis not present

## 2022-06-13 LAB — COMPREHENSIVE METABOLIC PANEL
ALT: 41 U/L (ref 0–44)
AST: 25 U/L (ref 15–41)
Albumin: 3.8 g/dL (ref 3.5–5.0)
Alkaline Phosphatase: 50 U/L (ref 38–126)
Anion gap: 4 — ABNORMAL LOW (ref 5–15)
BUN: 16 mg/dL (ref 8–23)
CO2: 26 mmol/L (ref 22–32)
Calcium: 10 mg/dL (ref 8.9–10.3)
Chloride: 113 mmol/L — ABNORMAL HIGH (ref 98–111)
Creatinine, Ser: 0.94 mg/dL (ref 0.61–1.24)
GFR, Estimated: >60 mL/min (ref >60)
Glucose, Bld: 98 mg/dL (ref 70–99)
Potassium: 4.6 mmol/L (ref 3.5–5.1)
Sodium: 143 mmol/L (ref 135–145)
Total Bilirubin: 1 mg/dL (ref 0.3–1.2)
Total Protein: 6.6 g/dL (ref 6.5–8.1)

## 2022-06-13 LAB — CBC
HCT: 42.8 % (ref 39.0–52.0)
Hemoglobin: 14.5 g/dL (ref 13.0–17.0)
MCH: 28.8 pg (ref 26.0–34.0)
MCHC: 33.9 g/dL (ref 30.0–36.0)
MCV: 85.1 fL (ref 80.0–100.0)
Platelets: 205 10*3/uL (ref 150–400)
RBC: 5.03 MIL/uL (ref 4.22–5.81)
RDW: 14 % (ref 11.5–15.5)
WBC: 7.6 10*3/uL (ref 4.0–10.5)
nRBC: 0 % (ref 0.0–0.2)

## 2022-06-13 LAB — MAGNESIUM: Magnesium: 2.3 mg/dL (ref 1.7–2.4)

## 2022-06-13 MED ORDER — ISOSORBIDE MONONITRATE ER 60 MG PO TB24: 30.0000 mg | ORAL_TABLET | Freq: Every day | ORAL | Status: DC

## 2022-06-13 MED ORDER — ISOSORBIDE MONONITRATE ER 60 MG PO TB24: 60.0000 mg | ORAL_TABLET | Freq: Every day | ORAL | Status: DC

## 2022-06-13 NOTE — Progress Notes (Signed)
IV removed and discharge instructions reviewed.  Transported by [facility] to ED entrance and to drive self home

## 2022-06-13 NOTE — TOC Progression Note (Signed)
  Transition of Care [facility]) Screening Note   Patient Details  Name: [name] Date of Birth: April 29, 1941   Transition of Care [facility]) CM/SW Contact:    Boneta Lucks, RN Phone Number: 06/13/2022, 11:47 AM  Cardiology following   Transition of Care Department [facility]) has reviewed patient and no TOC needs have been identified at this time. We will continue to monitor patient advancement through interdisciplinary progression rounds. If new patient transition needs arise, please place a TOC consult.    Expected Discharge Plan: Home/Self Care Barriers to Discharge: Continued Medical Work up  Expected Discharge Plan and Services Expected Discharge Plan: Home/Self Care       Living arrangements for the past 2 months: [place]

## 2022-06-13 NOTE — Progress Notes (Addendum)
Rounding Note    Patient Name: Bryan Cabrera Date of Encounter: 06/13/2022  [facility] Cardiologist: [name], MD   Subjective   Heart rates improved to the 70s. Patient is overall feeling good.   Inpatient Medications    Scheduled Meds:  apixaban  5 mg Oral BID   clopidogrel  75 mg Oral Daily   furosemide  20 mg Oral q AM   irbesartan  37.5 mg Oral Daily   isosorbide mononitrate  30 mg Oral Daily   latanoprost  1 drop Both Eyes QHS   Continuous Infusions:  PRN Meds: hydrALAZINE   Vital Signs    Vitals:   06/12/22 2255 06/12/22 2350 06/12/22 2350 06/13/22 0401  BP: (!) 141/63 (!) 180/79 (!) 180/79 (!) 169/97  Pulse: (!) 56 (!) 58 (!) 58 71  Resp: 18     Temp: (!) 97.4 F (36.3 C) 97.6 F (36.4 C) 97.6 F (36.4 C) 98.1 F (36.7 C)  TempSrc: Oral Oral Oral Oral  SpO2: 96% 97% 97% 98%  Weight:  94.6 kg 94.7 kg   Height:  '5\' 8"'$  (1.727 m) '5\' 8"'$  (1.727 m)     Intake/Output Summary (Last 24 hours) at 06/13/2022 0858 Last data filed at 06/13/2022 0500 Gross per 24 hour  Intake --  Output 350 ml  Net -350 ml      06/12/2022   11:50 PM 06/09/2022    2:28 PM 04/24/2022    4:13 PM  Last 3 Weights  Weight (lbs) 208 lb 11.2 oz   208 lb 9.6 oz 205 lb 204 lb  Weight (kg) 94.666 kg   94.62 kg 92.987 kg 92.534 kg      Telemetry    HR 50>70s - Personally Reviewed  ECG    No new - Personally Reviewed  Physical Exam   GEN: No acute distress.   Neck: No JVD Cardiac: RRR, no murmurs, rubs, or gallops.  Respiratory: Clear to auscultation bilaterally. GI: Soft, nontender, non-distended  MS: No edema; No deformity. Neuro:  Nonfocal  Psych: Normal affect   Labs    High Sensitivity Troponin:   Recent Labs  Lab 06/12/22 1426  TROPONINIHS 6     Chemistry Recent Labs  Lab 06/12/22 1426 06/13/22 0358  NA 139 143  K 3.8 4.6  CL 109 113*  CO2 22 26  GLUCOSE 138* 98  BUN 17 16  CREATININE 0.97 0.94  CALCIUM 9.3 10.0  MG  --  2.3   PROT  --  6.6  ALBUMIN  --  3.8  AST  --  25  ALT  --  41  ALKPHOS  --  50  BILITOT  --  1.0  GFRNONAA >60 >60  ANIONGAP 8 4*    Lipids No results for input(s): "CHOL", "TRIG", "HDL", "LABVLDL", "LDLCALC", "CHOLHDL" in the last 168 hours.  Hematology Recent Labs  Lab 06/12/22 1426 06/13/22 0358  WBC 5.8 7.6  RBC 5.01 5.03  HGB 14.4 14.5  HCT 41.9 42.8  MCV 83.6 85.1  MCH 28.7 28.8  MCHC 34.4 33.9  RDW 13.7 14.0  PLT 213 205   Thyroid No results for input(s): "TSH", "FREET4" in the last 168 hours.  BNP Recent Labs  Lab 06/12/22 1427  BNP 462.0*    DDimer No results for input(s): "DDIMER" in the last 168 hours.   Radiology    DG Chest Portable 1 View  Result Date: 06/12/2022 CLINICAL DATA:  Shortness of breath on exertion. Hypertension.  History of atrial fibrillation. EXAM: PORTABLE CHEST 1 VIEW COMPARISON:  11/24/2021 FINDINGS: Heart size is normal. Mediastinal shadows are normal. The lungs are clear. The vascularity is normal. No effusion. No abnormal bone finding. IMPRESSION: No active disease. Electronically Signed   By: Nelson Chimes M.D.   On: 06/12/2022 14:43    Cardiac Studies    Echo 12/2021  1. Left ventricular ejection fraction, by estimation, is 65 to 70%. The  left ventricle has normal function. The left ventricle has no regional  wall motion abnormalities. There is mild asymmetric left ventricular  hypertrophy. Left ventricular diastolic  parameters are indeterminate.   2. Right ventricular systolic function is normal. The right ventricular  size is normal. Increased right ventricular wall thickness. There is  normal pulmonary artery systolic pressure.   3. Left atrial size was moderately dilated.   4. The mitral valve is normal in structure. Trivial mitral valve  regurgitation. No evidence of mitral stenosis.   5. The aortic valve is tricuspid. Aortic valve regurgitation is not  visualized. No aortic stenosis is present.   6. The inferior vena  cava is normal in size with greater than 50%  respiratory variability, suggesting right atrial pressure of 3 mmHg.   Comparison(s): No significant change from prior study.  Cardiac cath 11/2021   CULPRIT LESION SEGMENT: Prox RCA lesion is 50% stenosed.  Prox RCA to Mid RCA lesion is 99% stenosed.  TIMI 0 flow noted in the PDA   Prox RCA to Mid RCA lesion is 99% stenosed.   A drug-eluting stent was successfully placed using a STENT ONYX FRONTIER [id number]-deployed to 3.8 mm.   Post intervention, there is a 0% residual stenosis throughout after postdilation, TIMI-3 flow restored from TIMI 0..   -------------------------------   Otherwise angiographically normal Left Coronary System.   LV end diastolic pressure is normal.   There is no aortic valve stenosis.   SUMMARY Severe single-vessel CAD with tapered to 50% down to 99% subtotal occlusion of proximal to mid RCA with TIMI 0 flow down the PDA Successful DES PCI of the proximal to mid RCA 50-99% stenoses using Onyx frontier DES 3.5 mm x 30 mm deployed to 3.8 mm.- Reduced to 0% stenosis, TIMI-3 flow restored. Otherwise angiographically minimal CAD in the Left Coronary System. Known preserved EF by stress test, normal LVEDP     RECOMMENDATIONS Due to advanced age, and infirm wife at home, safer to monitor overnight as outpatient with extended recovery.  Plan discharge in the morning. Continue cardiovascular risk factor modification. Anticoagulation/antiplatelet agents per Recommendations section       Bryan Hew, MD  Patient Profile     81 y.o. male with CAD with PCI/DES to the RCA 11/2021 and Afib who is being seen for symptomatic bradycardia.   Assessment & Plan   Persistent Afib Symptomatic bradycardia - patient presented with bradycardia after taking 2 doses of diltiazem '120mg'$  on Friday and Satruday. It was prescribed for HTN by PCP. He was also on metoprolol '25mg'$  daily.  - Heart rates were in the 30s on arrival - metoprolol  and diltiazem were stopped - heart rates improved to the 70s - continue Eliquis for stroke ppx - he has seen EP and was considering Watchman device  CAD s/p RCA stent 11/2021 - continue Plavix - no anginal symptoms reported  HTN - difficult to control - Started on Irbesartan 37.'5mg'$  daily.  Home medication is olmesartan 40 mg daily, which would correspond to irbesartan 300 mg daily.  Recommend increasing dose of irbesartan if not discharged. - Imdur '30mg'$  daily  [place] will sign off.   Medication Recommendations:  Hold metoprolol and diltiazem Other recommendations (labs, testing, etc): Zio patch x7 days on discharge Follow up as an outpatient: Follow-up scheduled with Dr. [name] on 07/03/2022   For questions or updates, please contact [place] Please consult [email or web address] for contact info under    Signed, Cadence Ninfa Meeker, [name]  06/13/2022, 8:58 AM     Patient seen and examined.  Agree with above documentation.  On exam, patient is alert and oriented, irregular rhythm, normal rate, no murmurs, lungs CTAB, no LE edema or JVD.  Presented with bradycardia down the 30s.  Had been on metoprolol 25 mg daily and had recently started taking diltiazem 120 mg daily.  These medicines were discontinued.  Remains in A-fib, heart rate improved now 50 to 80s.  Okay for discharge from cardiac standpoint.  Would continue to hold metoprolol and diltiazem.  Will order Zio patch x7 days on discharge to evaluate for tachybrady.    Donato Heinz, MD

## 2022-06-13 NOTE — Assessment & Plan Note (Addendum)
Continue ARB (therapeutic interchange) Continue Imdur Continue diltiazem and metoprolol secondary to bradycardia Plan to add hydralazine if BP remains uncontrolled

## 2022-06-13 NOTE — Assessment & Plan Note (Signed)
Continue statin. 

## 2022-06-13 NOTE — Assessment & Plan Note (Addendum)
Presented with heart rates in the 30s and shortness of breath/dizzy Diltiazem and metoprolol stopped -- Heart rates improved 50-70 -- Appreciate cardiology consult>>ok to go home with Zio patch

## 2022-06-13 NOTE — Assessment & Plan Note (Signed)
Status post DES to the RCA February 2023 -Continue Plavix -No chest pain presently

## 2022-06-13 NOTE — Discharge Summary (Addendum)
Physician Discharge Summary   Patient: Bryan Cabrera MRN: [id number] DOB: Jan 11, 1941  Admit date:     06/12/2022  Discharge date: 06/13/22  Discharge Physician: Shanon Brow Hartlyn Reigel   PCP: [name], MD   Recommendations at discharge:   Please follow up with primary care provider within 1-2 weeks  Please repeat BMP and CBC in one week    Hospital Course: 81 year old male with a history of persistent atrial fibrillation status post Watchman device, coronary disease status post RCA stent February 2023, TIA presenting with bradycardia and some shortness of breath.  Patient was seen in his PCP office on 06/09/2022.  At that time, Cardizem CD 120 mg was added to the patient's antihypertensive regimen.  The patient was already on metoprolol, Imdur, and olmesartan.  The patient was previously on a thiazide, but has been since taken off.  Nevertheless, the patient states that he took the diltiazem on 06/09/2022 and 06/10/2022 and found that his blood pressure dropped to the low 100 range and his pulse is also dropped He did not think too much of this but was afraid to take it on Sunday did not take it He took another Cardizem 8/28 AM because blood pressures were in the 213Y systolic--- he noted that his heart rate was in the 30s and decided to come to the emergency room. In the ED, the patient was afebrile and hemodynamically stable with heart rates in the 30s.  Oxygen saturation was 98% room air.  BMP showed sodium 138, potassium 3.8, bicarbonate 22, serum creatinine 0.97.  WBC 5.8, hemoglobin 14.4.  Cardiology was consulted to assist with management.  His metoprolol and diltiazem were discontinued.  His heart rates did improve with discontinuation of these 2 agents.  However he remained hypertensive with SBP 170-180.  Assessment and Plan: Coronary artery disease Status post DES to the RCA February 2023 -Continue Plavix -No chest pain presently  Symptomatic bradycardia Presented with heart rates in the  30s and shortness of breath/dizzy Diltiazem and metoprolol stopped -- Heart rates improved 50-70 -- Appreciate cardiology consult>>ok to go home with Zio patch  Persistent atrial fibrillation (HCC) Continue apixaban --previously on holiday due to large left hip hematoma Rate controlled presently Bradycardic on diltiazem and metoprolol which have been discontinued Under need EP evaluation for Watchman  Hyperlipidemia Continue statin  Hypertensive urgency Continue ARB (therapeutic interchange) Continue Imdur Continue diltiazem and metoprolol secondary to bradycardia Plan to add hydralazine if BP remains uncontrolled         Consultants: cardiology Procedures performed: none  Disposition: Home Diet recommendation:  Cardiac diet DISCHARGE MEDICATION: Allergies as of 06/13/2022       Reactions   Ciprofloxacin Other (See Comments)   Nausea and dizziness   Metronidazole Other (See Comments)   Nausea and dizziness   Penicillins Other (See Comments)   REACTION: has family hx ? If ever had a reaction Has patient had a PCN reaction causing immediate rash, facial/tongue/throat swelling, SOB or lightheadedness with hypotension: No Has patient had a PCN reaction causing severe rash involving mucus membranes or skin necrosis: No Has patient had a PCN reaction that required hospitalization: No Has patient had a PCN reaction occurring within the last 10 years: No If all of the above answers are "NO", then may proceed with Cephalosporin use.   Quinapril Hcl Cough   Tetracycline Hcl Nausea And Vomiting, Other (See Comments)   Vomiting with illness   Amoxicillin Other (See Comments)   REACTION: has family hx ?  If ever had a reaction Has patient had a PCN reaction causing immediate rash, facial/tongue/throat swelling, SOB or lightheadedness with hypotension: No Has patient had a PCN reaction causing severe rash involving mucus membranes or skin necrosis: No Has patient had a PCN  reaction that required hospitalization: No Has patient had a PCN reaction occurring within the last 10 years: No If all of the above answers are "NO", then may proceed with Cephalosporin use.        Medication List     STOP taking these medications    diltiazem 120 MG 24 hr capsule Commonly known as: Cardizem CD   metoprolol tartrate 25 MG tablet Commonly known as: LOPRESSOR       TAKE these medications    apixaban 5 MG Tabs tablet Commonly known as: ELIQUIS Take 1 tablet (5 mg total) by mouth 2 (two) times daily.   clopidogrel 75 MG tablet Commonly known as: PLAVIX TAKE ONE TABLET (75 MG TOTAL) BY MOUTH DAILY WITH BREAKFAST. What changed: See the new instructions.   isosorbide mononitrate 30 MG 24 hr tablet Commonly known as: IMDUR Take 1 tablet (30 mg total) by mouth daily.   latanoprost 0.005 % ophthalmic solution Commonly known as: XALATAN Place 1 drop into both eyes at bedtime.   nitroGLYCERIN 0.4 MG SL tablet Commonly known as: Nitrostat Place 1 tablet (0.4 mg total) under the tongue every 5 (five) minutes as needed for chest pain.   olmesartan 40 MG tablet Commonly known as: BENICAR Take 1 tablet (40 mg total) by mouth daily. What changed: when to take this   ondansetron 8 MG tablet Commonly known as: Zofran Take 1 tablet (8 mg total) by mouth every 6 (six) hours as needed for nausea or vomiting.   rosuvastatin 20 MG tablet Commonly known as: CRESTOR Take 1 tablet (20 mg total) by mouth daily.        Discharge Exam: Filed Weights   06/12/22 2350 06/12/22 2350  Weight: 94.6 kg 94.7 kg   HEENT:  [place]/AT, No thrush, no icterus CV:  RRR, no rub, no S3, no S4 Lung:  CTA, no wheeze, no rhonchi Abd:  soft/+BS, NT Ext:  No edema, no lymphangitis, no synovitis, no rash   Condition at discharge: stable  The results of significant diagnostics from this hospitalization (including imaging, microbiology, ancillary and laboratory) are listed below for  reference.   Imaging Studies: DG Chest Portable 1 View  Result Date: 06/12/2022 CLINICAL DATA:  Shortness of breath on exertion. Hypertension. History of atrial fibrillation. EXAM: PORTABLE CHEST 1 VIEW COMPARISON:  11/24/2021 FINDINGS: Heart size is normal. Mediastinal shadows are normal. The lungs are clear. The vascularity is normal. No effusion. No abnormal bone finding. IMPRESSION: No active disease. Electronically Signed   By: Nelson Chimes M.D.   On: 06/12/2022 14:43    Microbiology: Results for orders placed or performed during the hospital encounter of 12/19/21  Resp Panel by RT-PCR (Flu A&B, Covid) Nasopharyngeal Swab     Status: None   Collection Time: 12/19/21  7:04 PM   Specimen: Nasopharyngeal Swab; Nasopharyngeal(NP) swabs in vial transport medium  Result Value Ref Range Status   SARS Coronavirus 2 by RT PCR NEGATIVE NEGATIVE Final    Comment: (NOTE) SARS-CoV-2 target nucleic acids are NOT DETECTED.  The SARS-CoV-2 RNA is generally detectable in upper respiratory specimens during the acute phase of infection. The lowest concentration of SARS-CoV-2 viral copies this assay can detect is 138 copies/mL. A negative result does not  preclude SARS-Cov-2 infection and should not be used as the sole basis for treatment or other patient management decisions. A negative result may occur with  improper specimen collection/handling, submission of specimen other than nasopharyngeal swab, presence of viral mutation(s) within the areas targeted by this assay, and inadequate number of viral copies(<138 copies/mL). A negative result must be combined with clinical observations, patient history, and epidemiological information. The expected result is Negative.  Fact Sheet for Patients:  EntrepreneurPulse.com.au  Fact Sheet for Healthcare Providers:  IncredibleEmployment.be  This test is no t yet approved or cleared by the [place] FDA and  has  been authorized for detection and/or diagnosis of SARS-CoV-2 by FDA under an Emergency Use Authorization (EUA). This EUA will remain  in effect (meaning this test can be used) for the duration of the COVID-19 declaration under Section 564(b)(1) of the Act, 21 U.S.C.section 360bbb-3(b)(1), unless the authorization is terminated  or revoked sooner.       Influenza A by PCR NEGATIVE NEGATIVE Final   Influenza B by PCR NEGATIVE NEGATIVE Final    Comment: (NOTE) The Xpert Xpress SARS-CoV-2/FLU/RSV plus assay is intended as an aid in the diagnosis of influenza from Nasopharyngeal swab specimens and should not be used as a sole basis for treatment. Nasal washings and aspirates are unacceptable for Xpert Xpress SARS-CoV-2/FLU/RSV testing.  Fact Sheet for Patients: EntrepreneurPulse.com.au  Fact Sheet for Healthcare Providers: IncredibleEmployment.be  This test is not yet approved or cleared by the [place] FDA and has been authorized for detection and/or diagnosis of SARS-CoV-2 by FDA under an Emergency Use Authorization (EUA). This EUA will remain in effect (meaning this test can be used) for the duration of the COVID-19 declaration under Section 564(b)(1) of the Act, 21 U.S.C. section 360bbb-3(b)(1), unless the authorization is terminated or revoked.  Performed at [facility], [address] [place], [place] [address]     Labs: CBC: Recent Labs  Lab 06/12/22 1426 06/13/22 0358  WBC 5.8 7.6  NEUTROABS 3.2  --   HGB 14.4 14.5  HCT 41.9 42.8  MCV 83.6 85.1  PLT 213 604   Basic Metabolic Panel: Recent Labs  Lab 06/12/22 1426 06/13/22 0358  NA 139 143  K 3.8 4.6  CL 109 113*  CO2 22 26  GLUCOSE 138* 98  BUN 17 16  CREATININE 0.97 0.94  CALCIUM 9.3 10.0  MG  --  2.3   Liver Function Tests: Recent Labs  Lab 06/13/22 0358  AST 25  ALT 41  ALKPHOS 50  BILITOT 1.0  PROT 6.6  ALBUMIN 3.8   CBG: No results for  input(s): "GLUCAP" in the last 168 hours.  Discharge time spent: greater than 30 minutes.  Signed: Orson Eva, MD [facility] 06/13/2022

## 2022-06-13 NOTE — Telephone Encounter (Signed)
7 day zio ordered to be mailed to patient.

## 2022-06-13 NOTE — Assessment & Plan Note (Addendum)
Continue apixaban --previously on holiday due to large left hip hematoma Rate controlled presently Bradycardic on diltiazem and metoprolol which have been discontinued Under need EP evaluation for Watchman

## 2022-06-13 NOTE — Telephone Encounter (Signed)
-----   Message from [place], [name] sent at 06/13/2022 12:50 PM EDT ----- Regarding: hospital follow-up Can we order the patient a 7day zio for afib/bradycardia? Already has f/u

## 2022-06-14 ENCOUNTER — Telehealth: Payer: Self-pay

## 2022-06-14 NOTE — Telephone Encounter (Signed)
Transition Care Management Follow-up Telephone Call Date of discharge and from where: [place] 06-13-22 Dx: Symptomatic bradycardia How have you been since you were released from the hospital? Doing ok  Any questions or concerns? No  Items Reviewed: Did the pt receive and understand the discharge instructions provided? Yes  Medications obtained and verified? Yes  Other? No  Any new allergies since your discharge? No  Dietary orders reviewed? Yes Do you have support at home? Yes   Home Care and Equipment/Supplies: Were home health services ordered? no If so, what is the name of the agency? na Has the agency set up a time to come to the patient's home? not applicable Were any new equipment or medical supplies ordered?  No What is the name of the medical supply agency? na Were you able to get the supplies/equipment? not applicable Do you have any questions related to the use of the equipment or supplies? No  Functional Questionnaire: (I = Independent and D = Dependent) ADLs: I  Bathing/Dressing- I  Meal Prep- I  Eating- I  Maintaining continence- I  Transferring/Ambulation- I  Managing Meds- I  Follow up appointments reviewed:  PCP Hospital f/u appt confirmed? Yes  Scheduled to see Dr Regis Bill  on 06-27-22 @ 1130am. [facility] f/u appt confirmed? Yes  Scheduled to see Dr [name] on 07-03-22- unsure of time  Are transportation arrangements needed? No  If their condition worsens, is the pt aware to call PCP or go to the Emergency Dept.? Yes Was the patient provided with contact information for the PCP's office or ED? Yes Was to pt encouraged to call back with questions or concerns? Yes

## 2022-06-20 ENCOUNTER — Encounter (HOSPITAL_COMMUNITY): Payer: Self-pay

## 2022-06-20 DIAGNOSIS — L570 Actinic keratosis: Secondary | ICD-10-CM | POA: Diagnosis not present

## 2022-06-20 DIAGNOSIS — D485 Neoplasm of uncertain behavior of skin: Secondary | ICD-10-CM | POA: Diagnosis not present

## 2022-06-20 DIAGNOSIS — B354 Tinea corporis: Secondary | ICD-10-CM | POA: Diagnosis not present

## 2022-06-20 DIAGNOSIS — L309 Dermatitis, unspecified: Secondary | ICD-10-CM | POA: Diagnosis not present

## 2022-06-20 DIAGNOSIS — Z85828 Personal history of other malignant neoplasm of skin: Secondary | ICD-10-CM | POA: Diagnosis not present

## 2022-06-27 ENCOUNTER — Encounter: Payer: Self-pay | Admitting: Internal Medicine

## 2022-06-27 ENCOUNTER — Ambulatory Visit: Payer: Medicare PPO | Admitting: Internal Medicine

## 2022-06-27 VITALS — BP 126/74 | HR 66 | Temp 98.1°F | Wt 203.0 lb

## 2022-06-27 DIAGNOSIS — Z09 Encounter for follow-up examination after completed treatment for conditions other than malignant neoplasm: Secondary | ICD-10-CM

## 2022-06-27 DIAGNOSIS — R42 Dizziness and giddiness: Secondary | ICD-10-CM

## 2022-06-27 DIAGNOSIS — I10 Essential (primary) hypertension: Secondary | ICD-10-CM

## 2022-06-27 DIAGNOSIS — Z79899 Other long term (current) drug therapy: Secondary | ICD-10-CM

## 2022-06-27 DIAGNOSIS — Z955 Presence of coronary angioplasty implant and graft: Secondary | ICD-10-CM | POA: Diagnosis not present

## 2022-06-27 DIAGNOSIS — Z87898 Personal history of other specified conditions: Secondary | ICD-10-CM
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
## 2022-06-27 NOTE — Patient Instructions (Addendum)
You home bp is running high  but  pulse is not low   Bp was up when standing today .  Bring in your readings and monitor to your visit with Dr [name] .  If needed to add med  adjustments would opine to Dr.  [name] .? Amlodipine or  other med hydralazine  besides those that lower  pulse .

## 2022-07-03 ENCOUNTER — Telehealth: Payer: Self-pay | Admitting: Internal Medicine

## 2022-07-03 ENCOUNTER — Ambulatory Visit: Payer: Medicare PPO | Attending: Cardiology | Admitting: Cardiology

## 2022-07-03 ENCOUNTER — Encounter: Payer: Self-pay | Admitting: Cardiology

## 2022-07-03 ENCOUNTER — Ambulatory Visit (INDEPENDENT_AMBULATORY_CARE_PROVIDER_SITE_OTHER): Payer: Medicare PPO

## 2022-07-03 VITALS — BP 140/70 | HR 64 | Ht 68.0 in | Wt 205.0 lb

## 2022-07-03 DIAGNOSIS — I4819 Other persistent atrial fibrillation: Secondary | ICD-10-CM

## 2022-07-03 DIAGNOSIS — R001 Bradycardia, unspecified: Secondary | ICD-10-CM

## 2022-07-03 DIAGNOSIS — I25110 Atherosclerotic heart disease of native coronary artery with unstable angina pectoris: Secondary | ICD-10-CM

## 2022-07-03 MED ORDER — AMLODIPINE BESYLATE 2.5 MG PO TABS: 2.5000 mg | ORAL_TABLET | Freq: Every day | ORAL | 3 refills | Status: DC

## 2022-07-03 NOTE — Patient Instructions (Signed)
Medication Instructions:  Please start Amlodipine 2.5 mg a day. Continue all other medications as listed.  *If you need a refill on your cardiac medications before your next appointment, please call your pharmacy*  Testing/Procedures: [place] Monitor Instructions  Your physician has requested you wear a ZIO patch monitor for 14 days.  This is a single patch monitor. [employer] supplies one patch monitor per enrollment. Additional stickers are not available. Please do not apply patch if you will be having a Nuclear Stress Test,  Echocardiogram, Cardiac CT, MRI, or Chest Xray during the period you would be wearing the  monitor. The patch cannot be worn during these tests. You cannot remove and re-apply the  ZIO XT patch monitor.  Your ZIO patch monitor will be mailed 3 day USPS to your address on file. It may take 3-5 days  to receive your monitor after you have been enrolled.  Once you have received your monitor, please review the enclosed instructions. Your monitor  has already been registered assigning a specific monitor serial # to you.  Billing and Patient Assistance Program Information  We have supplied [employer] with any of your insurance information on file for billing purposes. [employer] offers a sliding scale Patient Assistance Program for patients that do not have  insurance, or whose insurance does not completely cover the cost of the ZIO monitor.  You must apply for the Patient Assistance Program to qualify for this discounted rate.  To apply, please call [employer] at [phone], select option 4, select option 2, ask to apply for  Patient Assistance Program. Theodore Demark will ask your household income, and how many people  are in your household. They will quote your out-of-pocket cost based on that information.  [employer] will also be able to set up a 27-month interest-free payment plan if needed.  Applying the monitor   Shave hair from upper left chest.  Hold abrader disc  by orange tab. Rub abrader in 40 strokes over the upper left chest as  indicated in your monitor instructions.  Clean area with 4 enclosed alcohol pads. Let dry.  Apply patch as indicated in monitor instructions. Patch will be placed under collarbone on left  side of chest with arrow pointing upward.  Rub patch adhesive wings for 2 minutes. Remove white label marked "1". Remove the white  label marked "2". Rub patch adhesive wings for 2 additional minutes.  While looking in a mirror, press and release button in center of patch. A small green light will  flash 3-4 times. This will be your only indicator that the monitor has been turned on.  Do not shower for the first 24 hours. You may shower after the first 24 hours.  Press the button if you feel a symptom. You will hear a small click. Record Date, Time and  Symptom in the Patient Logbook.  When you are ready to remove the patch, follow instructions on the last 2 pages of Patient  Logbook. Stick patch monitor onto the last page of Patient Logbook.  Place Patient Logbook in the blue and white box. Use locking tab on box and tape box closed  securely. The blue and white box has prepaid postage on it. Please place it in the mailbox as  soon as possible. Your physician should have your test results approximately 7 days after the  monitor [facility]  Call [place] [phone] you have questions regarding  your ZIO XT  patch monitor. Call them immediately if you see an orange light blinking on your  monitor.  If your monitor falls off in less than 4 days, contact our Monitor department at [phone].  If your monitor becomes loose or falls off after 4 days call [employer] at [phone] for  suggestions on securing your monitor    Follow-Up: At [facility], you and your health needs are our priority.  As part of our continuing mission to provide you with exceptional heart care,  we have created designated Provider Care Teams.  These Care Teams include your primary Cardiologist (physician) and Advanced Practice Providers (APPs -  Physician Assistants and Nurse Practitioners) who all work together to provide you with the care you need, when you need it.  We recommend signing up for the patient portal called "MyChart".  Sign up information is provided on this After Visit Summary.  MyChart is used to connect with patients for Virtual Visits (Telemedicine).  Patients are able to view lab/test results, encounter notes, upcoming appointments, etc.  Non-urgent messages can be sent to your provider as well.   To learn more about what you can do with MyChart, go to [email or web address].    Your next appointment:   2 month(s)  The format for your next appointment:   In Person  Provider:   [name], MD      Important Information About Sugar
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
## 2022-07-03 NOTE — Progress Notes (Unsigned)
ZIO XT [id number] from office inventory applied to patient.

## 2022-07-03 NOTE — Telephone Encounter (Signed)
Left message for patient to call back and schedule Medicare Annual Wellness Visit (AWV) either virtually or in office. Left  my Bryan Cabrera number [phone]   Last AWV ; 04/13/17 please schedule at anytime with [facility] Nurse Health Advisor 1 or 2
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
## 2022-07-05 ENCOUNTER — Ambulatory Visit: Payer: Medicare PPO | Admitting: Neurology

## 2022-07-05 ENCOUNTER — Encounter: Payer: Self-pay | Admitting: Neurology

## 2022-07-05 VITALS — BP 157/80 | HR 77 | Ht 68.0 in | Wt 205.0 lb

## 2022-07-05 DIAGNOSIS — G459 Transient cerebral ischemic attack, unspecified: Secondary | ICD-10-CM | POA: Diagnosis not present

## 2022-07-05 NOTE — Patient Instructions (Signed)
Follow up as needed

## 2022-07-20 DIAGNOSIS — R001 Bradycardia, unspecified: Secondary | ICD-10-CM | POA: Diagnosis not present

## 2022-07-20 DIAGNOSIS — I4819 Other persistent atrial fibrillation: Secondary | ICD-10-CM | POA: Diagnosis not present

## 2022-07-29 ENCOUNTER — Other Ambulatory Visit: Payer: Self-pay | Admitting: Cardiology

## 2022-07-31 ENCOUNTER — Encounter: Payer: Self-pay | Admitting: Cardiology

## 2022-07-31 NOTE — Telephone Encounter (Signed)
Prescription refill request for Eliquis received. Indication:Afib Last office visit:9/23 Scr:0.9 Age: 81 Weight:93 kg  Prescription refilled

## 2022-08-04 ENCOUNTER — Telehealth: Payer: Self-pay | Admitting: Internal Medicine

## 2022-08-04 NOTE — Telephone Encounter (Signed)
Left message for patient to call back and schedule Medicare Annual Wellness Visit (AWV) either virtually or in office. Left  my Bryan Cabrera number [phone]   Last AWV  04/13/17 please schedule with Nurse Health Adviser   45 min for awv-i and in office appointments 30 min for awv-s  phone/virtual appointments

## 2022-08-08 ENCOUNTER — Telehealth: Payer: Self-pay

## 2022-08-08 NOTE — Patient Outreach (Signed)
  Care Coordination   08/08/2022 Name: Bryan Cabrera MRN: [id number] DOB: 07-27-1941   Care Coordination Outreach Attempts:  An unsuccessful telephone outreach was attempted today to offer the patient information about available care coordination services as a benefit of their health plan.   Follow Up Plan:  Additional outreach attempts will be made to offer the patient care coordination information and services.   Encounter Outcome:  No Answer  Care Coordination Interventions Activated:  No   Care Coordination Interventions:  No, not indicated    Peter Garter RN, BSN,CCM, [facility] [phone]

## 2022-08-29 ENCOUNTER — Encounter: Payer: Self-pay | Admitting: Cardiology

## 2022-08-29 ENCOUNTER — Ambulatory Visit: Payer: Medicare PPO | Attending: Cardiology | Admitting: Cardiology

## 2022-08-29 VITALS — BP 140/80 | HR 78 | Ht 68.0 in | Wt 207.0 lb

## 2022-08-29 DIAGNOSIS — I25110 Atherosclerotic heart disease of native coronary artery with unstable angina pectoris: Secondary | ICD-10-CM

## 2022-08-29 DIAGNOSIS — I4819 Other persistent atrial fibrillation: Secondary | ICD-10-CM | POA: Diagnosis not present
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
## 2022-08-29 NOTE — Patient Instructions (Signed)
Medication Instructions:  Your physician recommends that you continue on your current medications as directed. Please refer to the Current Medication list given to you today.  *If you need a refill on your cardiac medications before your next appointment, please call your pharmacy*   Lab Work: NONE If you have labs (blood work) drawn today and your tests are completely normal, you will receive your results only by: [place] (if you have MyChart) OR A paper copy in the mail If you have any lab test that is abnormal or we need to change your treatment, we will call you to review the results.   Testing/Procedures: NONE   Follow-Up: At [facility], you and your health needs are our priority.  As part of our continuing mission to provide you with exceptional heart care, we have created designated Provider Care Teams.  These Care Teams include your primary Cardiologist (physician) and Advanced Practice Providers (APPs -  Physician Assistants and Nurse Practitioners) who all work together to provide you with the care you need, when you need it.    Your next appointment:   6 month(s)  The format for your next appointment:   In Person  Provider:   Nicholes Rough, [name], Ambrose Pancoast, NP, or Christen Bame, NP     Then, [name], MD will plan to see you again in 1 year(s).     Important Information About Sugar

## 2022-09-23 ENCOUNTER — Other Ambulatory Visit: Payer: Self-pay | Admitting: Internal Medicine

## 2022-10-31 ENCOUNTER — Ambulatory Visit: Payer: Medicare PPO | Admitting: Internal Medicine

## 2022-11-27 ENCOUNTER — Ambulatory Visit: Payer: Medicare PPO | Admitting: Internal Medicine

## 2022-11-29 ENCOUNTER — Other Ambulatory Visit: Payer: Self-pay | Admitting: Internal Medicine

## 2022-11-29 ENCOUNTER — Ambulatory Visit: Payer: Medicare PPO | Admitting: Internal Medicine

## 2022-11-29 ENCOUNTER — Ambulatory Visit (INDEPENDENT_AMBULATORY_CARE_PROVIDER_SITE_OTHER): Payer: Medicare PPO

## 2022-11-29 ENCOUNTER — Encounter: Payer: Self-pay | Admitting: Internal Medicine

## 2022-11-29 VITALS — BP 120/60 | HR 84 | Temp 97.6°F | Ht 68.0 in | Wt 214.6 lb

## 2022-11-29 VITALS — BP 120/60 | HR 84 | Temp 98.1°F | Ht 68.0 in | Wt 214.6 lb

## 2022-11-29 DIAGNOSIS — I4819 Other persistent atrial fibrillation: Secondary | ICD-10-CM

## 2022-11-29 DIAGNOSIS — Z79899 Other long term (current) drug therapy: Secondary | ICD-10-CM

## 2022-11-29 DIAGNOSIS — Z955 Presence of coronary angioplasty implant and graft: Secondary | ICD-10-CM | POA: Diagnosis not present

## 2022-11-29 DIAGNOSIS — Z0000 Encounter for general adult medical examination without abnormal findings: Secondary | ICD-10-CM | POA: Diagnosis not present

## 2022-11-29 DIAGNOSIS — Z23 Encounter for immunization: Secondary | ICD-10-CM

## 2022-11-29 DIAGNOSIS — I209 Angina pectoris, unspecified: Secondary | ICD-10-CM | POA: Diagnosis not present

## 2022-11-29 DIAGNOSIS — R7301 Impaired fasting glucose: Secondary | ICD-10-CM

## 2022-11-29 DIAGNOSIS — Z8739 Personal history of other diseases of the musculoskeletal system and connective tissue: Secondary | ICD-10-CM | POA: Diagnosis not present

## 2022-11-29 DIAGNOSIS — Z7901 Long term (current) use of anticoagulants: Secondary | ICD-10-CM | POA: Diagnosis not present

## 2022-11-29 DIAGNOSIS — I10 Essential (primary) hypertension: Secondary | ICD-10-CM | POA: Diagnosis not present

## 2022-11-29 DIAGNOSIS — E785 Hyperlipidemia, unspecified: Secondary | ICD-10-CM

## 2022-11-29 DIAGNOSIS — Z8673 Personal history of transient ischemic attack (TIA), and cerebral infarction without residual deficits: Secondary | ICD-10-CM

## 2022-11-29 DIAGNOSIS — I2510 Atherosclerotic heart disease of native coronary artery without angina pectoris: Secondary | ICD-10-CM
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
## 2022-11-29 NOTE — Patient Instructions (Addendum)
Bryan Cabrera , Thank you for taking time to come for your Medicare Wellness Visit. I appreciate your ongoing commitment to your health goals. Please review the following plan we discussed and let me know if I can assist you in the future.   These are the goals we discussed:  Goals       Maintain current health status      Stay Healthy (pt-stated)      Stay Active        This is a list of the screening recommended for you and due dates:  Health Maintenance  Topic Date Due   COVID-19 Vaccine (4 - 2023-24 season) 12/15/2022*   Zoster (Shingles) Vaccine (1 of 2) 02/27/2023*   Pneumonia Vaccine (1 of 1 - PCV) 11/30/2023*   Medicare Annual Wellness Visit  11/30/2023   DTaP/Tdap/Td vaccine (4 - Td or Tdap) 12/08/2029   Flu Shot  Completed   HPV Vaccine  Aged Out  *Topic was postponed. The date shown is not the original due date.    Advanced directives: Please bring a copy of your health care power of attorney and living will to the office to be added to your chart at your convenience.   Conditions/risks identified: None  Next appointment: Follow up in one year for your annual wellness visit.    Preventive Care 56 Years and Older, Male  Preventive care refers to lifestyle choices and visits with your health care provider that can promote health and wellness. What does preventive care include? A yearly physical exam. This is also called an annual well check. Dental exams once or twice a year. Routine eye exams. Ask your health care provider how often you should have your eyes checked. Personal lifestyle choices, including: Daily care of your teeth and gums. Regular physical activity. Eating a healthy diet. Avoiding tobacco and drug use. Limiting alcohol use. Practicing safe sex. Taking low doses of aspirin every day. Taking vitamin and mineral supplements as recommended by your health care provider. What happens during an annual well check? The services and screenings done by  your health care provider during your annual well check will depend on your age, overall health, lifestyle risk factors, and family history of disease. Counseling  Your health care provider may ask you questions about your: Alcohol use. Tobacco use. Drug use. Emotional well-being. Home and relationship well-being. Sexual activity. Eating habits. History of falls. Memory and ability to understand (cognition). Work and work Statistician. Screening  You may have the following tests or measurements: Height, weight, and BMI. Blood pressure. Lipid and cholesterol levels. These may be checked every 5 years, or more frequently if you are over 14 years old. Skin check. Lung cancer screening. You may have this screening every year starting at age 5 if you have a 30-pack-year history of smoking and currently smoke or have quit within the past 15 years. Fecal occult blood test (FOBT) of the stool. You may have this test every year starting at age 59. Flexible sigmoidoscopy or colonoscopy. You may have a sigmoidoscopy every 5 years or a colonoscopy every 10 years starting at age 5. Prostate cancer screening. Recommendations will vary depending on your family history and other risks. Hepatitis C blood test. Hepatitis B blood test. Sexually transmitted disease (STD) testing. Diabetes screening. This is done by checking your blood sugar (glucose) after you have not eaten for a while (fasting). You may have this done every 1-3 years. Abdominal aortic aneurysm (AAA) screening. You may need this  if you are a current or former smoker. Osteoporosis. You may be screened starting at age 73 if you are at high risk. Talk with your health care provider about your test results, treatment options, and if necessary, the need for more tests. Vaccines  Your health care provider may recommend certain vaccines, such as: Influenza vaccine. This is recommended every year. Tetanus, diphtheria, and acellular pertussis  (Tdap, Td) vaccine. You may need a Td booster every 10 years. Zoster vaccine. You may need this after age 72. Pneumococcal 13-valent conjugate (PCV13) vaccine. One dose is recommended after age 68. Pneumococcal polysaccharide (PPSV23) vaccine. One dose is recommended after age 29. Talk to your health care provider about which screenings and vaccines you need and how often you need them. This information is not intended to replace advice given to you by your health care provider. Make sure you discuss any questions you have with your health care provider. Document Released: 10/29/2015 Document Revised: 06/21/2016 Document Reviewed: 08/03/2015 Elsevier Interactive Patient Education  2017 Falls Prevention in the Home Falls can cause injuries. They can happen to people of all ages. There are many things you can do to make your home safe and to help prevent falls. What can I do on the outside of my home? Regularly fix the edges of walkways and driveways and fix any cracks. Remove anything that might make you trip as you walk through a door, such as a raised step or threshold. Trim any bushes or trees on the path to your home. Use bright outdoor lighting. Clear any walking paths of anything that might make someone trip, such as rocks or tools. Regularly check to see if handrails are loose or broken. Make sure that both sides of any steps have handrails. Any raised decks and porches should have guardrails on the edges. Have any leaves, snow, or ice cleared regularly. Use sand or salt on walking paths during winter. Clean up any spills in your garage right away. This includes oil or grease spills. What can I do in the bathroom? Use night lights. Install grab bars by the toilet and in the tub and shower. Do not use towel bars as grab bars. Use non-skid mats or decals in the tub or shower. If you need to sit down in the shower, use a plastic, non-slip stool. Keep the floor dry. Clean  up any water that spills on the floor as soon as it happens. Remove soap buildup in the tub or shower regularly. Attach bath mats securely with double-sided non-slip rug tape. Do not have throw rugs and other things on the floor that can make you trip. What can I do in the bedroom? Use night lights. Make sure that you have a light by your bed that is easy to reach. Do not use any sheets or blankets that are too big for your bed. They should not hang down onto the floor. Have a firm chair that has side arms. You can use this for support while you get dressed. Do not have throw rugs and other things on the floor that can make you trip. What can I do in the kitchen? Clean up any spills right away. Avoid walking on wet floors. Keep items that you use a lot in easy-to-reach places. If you need to reach something above you, use a strong step stool that has a grab bar. Keep electrical cords out of the way. Do not use floor polish or wax that makes floors slippery.  If you must use wax, use non-skid floor wax. Do not have throw rugs and other things on the floor that can make you trip. What can I do with my stairs? Do not leave any items on the stairs. Make sure that there are handrails on both sides of the stairs and use them. Fix handrails that are broken or loose. Make sure that handrails are as long as the stairways. Check any carpeting to make sure that it is firmly attached to the stairs. Fix any carpet that is loose or worn. Avoid having throw rugs at the top or bottom of the stairs. If you do have throw rugs, attach them to the floor with carpet tape. Make sure that you have a light switch at the top of the stairs and the bottom of the stairs. If you do not have them, ask someone to add them for you. What else can I do to help prevent falls? Wear shoes that: Do not have high heels. Have rubber bottoms. Are comfortable and fit you well. Are closed at the toe. Do not wear sandals. If you  use a stepladder: Make sure that it is fully opened. Do not climb a closed stepladder. Make sure that both sides of the stepladder are locked into place. Ask someone to hold it for you, if possible. Clearly mark and make sure that you can see: Any grab bars or handrails. First and last steps. Where the edge of each step is. Use tools that help you move around (mobility aids) if they are needed. These include: Canes. Walkers. Scooters. Crutches. Turn on the lights when you go into a dark area. Replace any light bulbs as soon as they burn out. Set up your furniture so you have a clear path. Avoid moving your furniture around. If any of your floors are uneven, fix them. If there are any pets around you, be aware of where they are. Review your medicines with your doctor. Some medicines can make you feel dizzy. This can increase your chance of falling. Ask your doctor what other things that you can do to help prevent falls. This information is not intended to replace advice given to you by your health care provider. Make sure you discuss any questions you have with your health care provider. Document Released: 07/29/2009 Document Revised: 03/09/2016 Document Reviewed: 11/06/2014 Elsevier Interactive Patient Education  2017 [employer].
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
## 2022-11-29 NOTE — Patient Instructions (Addendum)
Plan lab fasting  preferred .  In August and then visit . For now    stay on the amlodipine 2.5  Good luck in  [place] .

## 2022-12-20 ENCOUNTER — Other Ambulatory Visit: Payer: Self-pay

## 2022-12-20 MED ORDER — ROSUVASTATIN CALCIUM 20 MG PO TABS: 20.0000 mg | ORAL_TABLET | Freq: Every day | ORAL | 2 refills | Status: DC

## 2022-12-28 ENCOUNTER — Other Ambulatory Visit: Payer: Self-pay

## 2022-12-28 DIAGNOSIS — I160 Hypertensive urgency: Secondary | ICD-10-CM

## 2022-12-28 DIAGNOSIS — E785 Hyperlipidemia, unspecified: Secondary | ICD-10-CM

## 2023-01-24 DIAGNOSIS — H35372 Puckering of macula, left eye: Secondary | ICD-10-CM | POA: Diagnosis not present

## 2023-01-24 DIAGNOSIS — H40011 Open angle with borderline findings, low risk, right eye: Secondary | ICD-10-CM | POA: Diagnosis not present

## 2023-01-31 ENCOUNTER — Telehealth: Payer: Self-pay | Admitting: Cardiology

## 2023-01-31 NOTE — Telephone Encounter (Signed)
  Pt c/o medication issue:  1. Name of Medication: clopidogrel (PLAVIX) 75 MG tablet   2. How are you currently taking this medication (dosage and times per day)? TAKE ONE TABLET (75 MG TOTAL) BY MOUTH DAILY WITH BREAKFAST.   3. Are you having a reaction (difficulty breathing--STAT)?  NA  4. What is your medication issue?  Patient states he was told he would be able to quit taking the Plavix after his year mark from his procedure. He would like to know if he needs to continue to take it or can he discontinue?

## 2023-02-02 NOTE — Telephone Encounter (Signed)
Tried to call back patient. No answer and no voicemail. Will try again later.

## 2023-02-05 NOTE — Telephone Encounter (Signed)
Left message for patient to call back  

## 2023-02-07 ENCOUNTER — Telehealth: Payer: Self-pay

## 2023-02-07 NOTE — Progress Notes (Signed)
Patient ID: Bryan Cabrera, male   DOB: 02-15-1941, 82 y.o.   MRN: [id number]  Care Management & Coordination Services Pharmacy Team  Reason for Encounter: Appointment Reminder  Contacted patient to confirm in office appointment with Milas Kocher, PharmD on 02/16/23 at 2. Spoke with  Dewayne Hatch Wife   on 02/07/2023   .Have you seen any other providers since your last visit? Wife reports none  Any changes in your medications or health? Wife reports none  Any side effects from any medications? Wife reports not that he has complained of to her  Do you have an symptoms or problems not managed by your medications? Wife reports no  Any concerns about your health right now? Wife reports no but will have him think about prior to appointment  Has your provider asked that you check blood pressure, blood sugar, or follow special diet at home?Wife reports they have a blood pressure cuff at home an he will check if he is feeling symptomatic of it being high but not regularly.  Do you get any type of exercise on a regular basis? 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 He uses a cane if need be when surveying for extra stability but otherwise is able to get around without any mobility devices for help.  Do you have any problems getting your medications? Wife reports they are happy with the pharmacy they use have been there over 30 years and cost of medications is not an issue for them.   Chart review:  Recent office visits:  11/29/22 Panosh, [name], MD - Patient presented for Primary hypertension and other concerns. No medication changes.   11/29/22 Tillie Rung, LPN - Patient presented for [facility] Annual Wellness exam and other concerns. Patient voiced goal of staying healthy and active.   Recent consult  visits:  08/29/22 [name], MD (Cardiology) - Patient presented for persistent atrial fibrillation and other concerns. No medication changes.  Hospital visits:  None in previous 6 months   Fill History : amlodipine 2.5 mg tablet 12/19/2022 90   ELIQUIS 5 MG TAB 01/27/2023 30   clopidogrel 75 mg tablet 12/19/2022 90   isosorbide mononitrate ER 30 mg tablet,extended release 24 hr 01/19/2023 30   LATANOPROST 0.005% EYE SOLN ML 01/09/2023 38   nitroGLYCERIN (NITROSTAT) 0.4 MG SL tablet 12/14/2021 7   olmesartan 40 mg tablet 11/17/2022 90   ONDANSETRON HCL 8 MG TAB 11/04/2021 9   ROSUVASTATIN CALCIUM 20 MG TAB 12/20/2022 90    Star Rating Drugs:  Amlodipine 2.5 mg - Last filled 12/19/22 90 DS at [facility] Rosuvastatin 20 mg - Last filled 12/20/22 90 DS at [facility]    Care Gaps: COVID Booster - Overdue Zoster Vaccine - Postponed PNA Vaccine - Postponed AWV - 11/29/22   Pamala Duffel CMA Clinical Pharmacist Assistant [phone]

## 2023-02-08 NOTE — Telephone Encounter (Signed)
Pt aware of Dr Anne Fu' orders to d/c plavix but continue Eliquis.  1 yr f/u appt made for November 2024

## 2023-02-14 ENCOUNTER — Other Ambulatory Visit: Payer: Self-pay | Admitting: Cardiology

## 2023-02-15 NOTE — Progress Notes (Signed)
Call to pt for reminder of appt on 02/16/23 he reports he is not interested in proceeding with the program at this time. JH advised  Pamala Duffel CMA Clinical Pharmacist Assistant [phone]

## 2023-02-26 ENCOUNTER — Other Ambulatory Visit: Payer: Self-pay | Admitting: Cardiology

## 2023-02-26 NOTE — Telephone Encounter (Signed)
Prescription refill request for Eliquis received. Indication: PAF Last office visit: 08/29/22  Michele Rockers MD Scr: 0.94 on 06/13/22  Epic Age: 82 Weight: 93.9kg  Based on above findings Eliquis 5mg  twice daily is the appropriate dose.  Refill approved.

## 2023-03-09 DIAGNOSIS — M25511 Pain in right shoulder: Secondary | ICD-10-CM | POA: Diagnosis not present

## 2023-03-09 DIAGNOSIS — M25562 Pain in left knee: Secondary | ICD-10-CM | POA: Diagnosis not present

## 2023-03-10 ENCOUNTER — Encounter (HOSPITAL_COMMUNITY): Payer: Self-pay | Admitting: Emergency Medicine

## 2023-03-10 ENCOUNTER — Other Ambulatory Visit: Payer: Self-pay

## 2023-03-10 ENCOUNTER — Emergency Department (HOSPITAL_COMMUNITY)
Admission: EM | Admit: 2023-03-10 | Discharge: 2023-03-11 | Disposition: A | Discharge status: Home / Self Care | Payer: Medicare PPO | Attending: Emergency Medicine | Admitting: Emergency Medicine

## 2023-03-10 DIAGNOSIS — R29818 Other symptoms and signs involving the nervous system: Secondary | ICD-10-CM | POA: Diagnosis not present

## 2023-03-10 DIAGNOSIS — D72829 Elevated white blood cell count, unspecified: Secondary | ICD-10-CM | POA: Diagnosis not present

## 2023-03-10 DIAGNOSIS — M25462 Effusion, left knee: Secondary | ICD-10-CM | POA: Insufficient documentation

## 2023-03-10 DIAGNOSIS — L03116 Cellulitis of left lower limb: Secondary | ICD-10-CM | POA: Insufficient documentation

## 2023-03-10 DIAGNOSIS — M25562 Pain in left knee: Secondary | ICD-10-CM | POA: Diagnosis not present

## 2023-03-10 DIAGNOSIS — Z7901 Long term (current) use of anticoagulants: Secondary | ICD-10-CM | POA: Diagnosis not present

## 2023-03-10 DIAGNOSIS — Z79899 Other long term (current) drug therapy: Secondary | ICD-10-CM | POA: Insufficient documentation

## 2023-03-10 DIAGNOSIS — I10 Essential (primary) hypertension: Secondary | ICD-10-CM | POA: Insufficient documentation

## 2023-03-10 DIAGNOSIS — R55 Syncope and collapse: Secondary | ICD-10-CM | POA: Diagnosis not present

## 2023-03-10 DIAGNOSIS — R42 Dizziness and giddiness: Secondary | ICD-10-CM | POA: Insufficient documentation

## 2023-03-10 DIAGNOSIS — I4891 Unspecified atrial fibrillation: Secondary | ICD-10-CM | POA: Diagnosis not present

## 2023-03-10 DIAGNOSIS — R4781 Slurred speech: Secondary | ICD-10-CM | POA: Diagnosis not present

## 2023-03-10 LAB — BASIC METABOLIC PANEL
Anion gap: 9 (ref 5–15)
BUN: 15 mg/dL (ref 8–23)
CO2: 22 mmol/L (ref 22–32)
Calcium: 9.7 mg/dL (ref 8.9–10.3)
Chloride: 105 mmol/L (ref 98–111)
Creatinine, Ser: 1.03 mg/dL (ref 0.61–1.24)
GFR, Estimated: >60 mL/min (ref >60)
Glucose, Bld: 165 mg/dL — ABNORMAL HIGH (ref 70–99)
Potassium: 3.5 mmol/L (ref 3.5–5.1)
Sodium: 136 mmol/L (ref 135–145)

## 2023-03-10 LAB — CBC
HCT: 44.6 % (ref 39.0–52.0)
Hemoglobin: 15.3 g/dL (ref 13.0–17.0)
MCH: 28.9 pg (ref 26.0–34.0)
MCHC: 34.3 g/dL (ref 30.0–36.0)
MCV: 84.2 fL (ref 80.0–100.0)
Platelets: 218 10*3/uL (ref 150–400)
RBC: 5.3 MIL/uL (ref 4.22–5.81)
RDW: 13.2 % (ref 11.5–15.5)
WBC: 11.1 10*3/uL — ABNORMAL HIGH (ref 4.0–10.5)
nRBC: 0 % (ref 0.0–0.2)

## 2023-03-10 LAB — CBG MONITORING, ED: Glucose-Capillary: 149 mg/dL — ABNORMAL HIGH (ref 70–99)

## 2023-03-10 NOTE — ED Triage Notes (Addendum)
Pt brought in by EMS for continued L knee pain. Per EMS, pt's daughter states pt has been having syncopal episodes since May 11th and L knee pain. Daughter reported to EMS that pt was diagnosed with an infection in his L knee and was started on Keflex of which he has taken 5 days of. Also reported that pt has intermittent slurred speech with syncopal episodes and that "all the meds he is on make him dizzy".  Pt with hx of Vertigo. EMS reported a CBG of 145.

## 2023-03-10 NOTE — ED Provider Notes (Incomplete)
[place] EMERGENCY DEPARTMENT AT [facility] Provider Note   CSN: [id number] Arrival date & time: 03/10/23  2149     History {Add pertinent medical, surgical, social history, OB history to HPI:1} Chief Complaint  Patient presents with  . Knee Pain  . Dizziness    Bryan Cabrera is a 82 y.o. male.  The history is provided by the patient.  Knee Pain Dizziness He has history of hypertension, hyperlipidemia, borderline diabetes, atrial fibrillation anticoagulated on apixaban   Home Medications Prior to Admission medications   Medication Sig Start Date End Date Taking? Authorizing Provider  amLODipine (NORVASC) 2.5 MG tablet Take 1 tablet (2.5 mg total) by mouth daily. 07/03/22   [name], MD  apixaban (ELIQUIS) 5 MG TABS tablet TAKE ONE TABLET (5 MG TOAL) BY MOUTH TWOTIMES DAILY. 02/26/23   [name], MD  isosorbide mononitrate (IMDUR) 30 MG 24 hr tablet TAKE ONE TABLET (30MG  TOTAL) BY MOUTH DAILY 09/25/22   Panosh, [name], MD  latanoprost (XALATAN) 0.005 % ophthalmic solution Place 1 drop into both eyes at bedtime. 08/09/18   Wynn Banker, MD  nitroGLYCERIN (NITROSTAT) 0.4 MG SL tablet Place 1 tablet (0.4 mg total) under the tongue every 5 (five) minutes as needed for chest pain. 12/14/21 12/14/22  Marjie Skiff E, [name]  olmesartan (BENICAR) 40 MG tablet Take 1 tablet (40 mg total) by mouth daily. 02/14/23   [name], MD  ondansetron (ZOFRAN) 8 MG tablet Take 1 tablet (8 mg total) by mouth every 6 (six) hours as needed for nausea or vomiting. 11/04/21   Nelwyn Salisbury, MD  rosuvastatin (CRESTOR) 20 MG tablet Take 1 tablet (20 mg total) by mouth daily. 12/20/22   [name], MD      Allergies    Ciprofloxacin, Metronidazole, Penicillins, Quinapril hcl, Tetracycline hcl, and Amoxicillin    Review of Systems   Review of Systems  Neurological:  Positive for dizziness.  All other systems reviewed and are negative.   Physical Exam Updated Vital  Signs BP (!) 164/97   Pulse 79   Temp 98.2 F (36.8 C) (Oral)   Resp 20   Ht 5\' 8"  (1.727 m)   Wt 98 kg   SpO2 100%   BMI 32.85 kg/m  Physical Exam Vitals and nursing note reviewed.   82 year old male, resting comfortably and in no acute distress. Vital signs are ***. Oxygen saturation is ***%, which is normal. Head is normocephalic and atraumatic. PERRLA, EOMI. Oropharynx is clear. Neck is nontender and supple without adenopathy or JVD. Back is nontender and there is no CVA tenderness. Lungs are clear without rales, wheezes, or rhonchi. Chest is nontender. Heart has regular rate and rhythm without murmur. Abdomen is soft, flat, nontender without masses or hepatosplenomegaly and peristalsis is normoactive. Extremities have no cyanosis or edema, full range of motion is present. Skin is warm and dry without rash. Neurologic: Mental status is normal, cranial nerves are intact, there are no motor or sensory deficits.  ED Results / Procedures / Treatments   Labs (all labs ordered are listed, but only abnormal results are displayed) Labs Reviewed  BASIC METABOLIC PANEL - Abnormal; Notable for the following components:      Result Value   Glucose, Bld 165 (*)    All other components within normal limits  CBC - Abnormal; Notable for the following components:   WBC 11.1 (*)    All other components within normal limits  CBG MONITORING, ED - Abnormal; Notable for the following components:   Glucose-Capillary 149 (*)    All other components within normal limits  URINALYSIS, ROUTINE W REFLEX MICROSCOPIC    EKG None  Radiology No results found.  Procedures Procedures  {Document cardiac monitor, telemetry assessment procedure when appropriate:1}  Medications Ordered in ED Medications - No data to display  ED Course/ Medical Decision Making/ A&P   {   Click here for ABCD2, HEART and other calculatorsREFRESH Note before signing :1}                          Medical Decision  Making Amount and/or Complexity of Data Reviewed Labs: ordered.   ***  {Document critical care time when appropriate:1} {Document review of labs and clinical decision tools ie heart score, Chads2Vasc2 etc:1}  {Document your independent review of radiology images, and any outside records:1} {Document your discussion with family members, caretakers, and with consultants:1} {Document social determinants of health affecting pt's care:1} {Document your decision making why or why not admission, treatments were needed:1} Final Clinical Impression(s) / ED Diagnoses Final diagnoses:  None    Rx / DC Orders ED Discharge Orders     None

## 2023-03-10 NOTE — ED Provider Notes (Signed)
[place] EMERGENCY DEPARTMENT AT [facility] Provider Note   CSN: [id number] Arrival date & time: 03/10/23  2149     History  Chief Complaint  Patient presents with   Knee Pain   Dizziness    Bryan Cabrera is a 82 y.o. male.  The history is provided by the patient.  Knee Pain Dizziness He has history of hypertension, hyperlipidemia, borderline diabetes, atrial fibrillation anticoagulated on apixaban and was diagnosed with cellulitis of his left knee by his orthopedic physician who did obtain x-rays.  He was started on cephalexin yesterday and has noted that the redness in the knee has decreased but is still feels warm and it is still painful.  He also has been having intermittent dizziness and slurring of speech although this is a problem that has been ongoing and is not any different today than it has been.  He denies any headache.   Home Medications Prior to Admission medications   Medication Sig Start Date End Date Taking? Authorizing Provider  amLODipine (NORVASC) 2.5 MG tablet Take 1 tablet (2.5 mg total) by mouth daily. 07/03/22   [name], MD  apixaban (ELIQUIS) 5 MG TABS tablet TAKE ONE TABLET (5 MG TOAL) BY MOUTH TWOTIMES DAILY. 02/26/23   [name], MD  isosorbide mononitrate (IMDUR) 30 MG 24 hr tablet TAKE ONE TABLET (30MG  TOTAL) BY MOUTH DAILY 09/25/22   Panosh, [name], MD  latanoprost (XALATAN) 0.005 % ophthalmic solution Place 1 drop into both eyes at bedtime. 08/09/18   Wynn Banker, MD  nitroGLYCERIN (NITROSTAT) 0.4 MG SL tablet Place 1 tablet (0.4 mg total) under the tongue every 5 (five) minutes as needed for chest pain. 12/14/21 12/14/22  Marjie Skiff E, [name]  olmesartan (BENICAR) 40 MG tablet Take 1 tablet (40 mg total) by mouth daily. 02/14/23   [name], MD  ondansetron (ZOFRAN) 8 MG tablet Take 1 tablet (8 mg total) by mouth every 6 (six) hours as needed for nausea or vomiting. 11/04/21   Nelwyn Salisbury, MD  rosuvastatin  (CRESTOR) 20 MG tablet Take 1 tablet (20 mg total) by mouth daily. 12/20/22   [name], MD      Allergies    Ciprofloxacin, Metronidazole, Penicillins, Quinapril hcl, Tetracycline hcl, and Amoxicillin    Review of Systems   Review of Systems  Neurological:  Positive for dizziness.  All other systems reviewed and are negative.   Physical Exam Updated Vital Signs BP (!) 164/97   Pulse 79   Temp 98.2 F (36.8 C) (Oral)   Resp 20   Ht 5\' 8"  (1.727 m)   Wt 98 kg   SpO2 100%   BMI 32.85 kg/m  Physical Exam Vitals and nursing note reviewed.   82 year old male, resting comfortably and in no acute distress. Vital signs are significant for elevated blood pressure. Oxygen saturation is 100%, which is normal. Head is normocephalic and atraumatic. PERRLA, EOMI. Oropharynx is clear.  There is no nystagmus. Neck is nontender and supple without adenopathy or JVD.  There are no carotid bruits. Lungs are clear without rales, wheezes, or rhonchi. Chest is nontender. Heart has an irregular rhythm without murmur. Abdomen is soft, flat, nontender. Extremities: There is moderate effusion present in the left knee but no erythema.  There is minimal warmth.  There is pain with flexion of the knee, no instability. Skin is warm and dry without rash. Neurologic: Mental status is normal, cranial nerves are intact,  moves all extremities equally.  ED Results / Procedures / Treatments   Labs (all labs ordered are listed, but only abnormal results are displayed) Labs Reviewed  BASIC METABOLIC PANEL - Abnormal; Notable for the following components:      Result Value   Glucose, Bld 165 (*)    All other components within normal limits  CBC - Abnormal; Notable for the following components:   WBC 11.1 (*)    All other components within normal limits  CBG MONITORING, ED - Abnormal; Notable for the following components:   Glucose-Capillary 149 (*)    All other components within normal limits   URINALYSIS, ROUTINE W REFLEX MICROSCOPIC   Radiology CT Head Wo Contrast  Result Date: 03/11/2023 CLINICAL DATA:  Acute neurologic deficit EXAM: CT HEAD WITHOUT CONTRAST TECHNIQUE: Contiguous axial images were obtained from the base of the skull through the vertex without intravenous contrast. RADIATION DOSE REDUCTION: This exam was performed according to the departmental dose-optimization program which includes automated exposure control, adjustment of the mA and/or kV according to patient size and/or use of iterative reconstruction technique. COMPARISON:  None Available. FINDINGS: Brain: There is no mass, hemorrhage or extra-axial collection. The appearance of the white matter is normal for the patient's age. There is generalized atrophy. Vascular: No abnormal hyperdensity of the major intracranial arteries or dural venous sinuses. No intracranial atherosclerosis. Skull: The visualized skull base, calvarium and extracranial soft tissues are normal. Sinuses/Orbits: No fluid levels or advanced mucosal thickening of the visualized paranasal sinuses. No mastoid or middle ear effusion. The orbits are normal. IMPRESSION: No acute intracranial abnormality. Electronically Signed   By: Deatra Robinson M.D.   On: 03/11/2023 00:37    Procedures Procedures  Cardiac monitor shows atrial fibrillation with controlled ventricular response, occasional PVC, per my interpretation.  Medications Ordered in ED Medications - No data to display  ED Course/ Medical Decision Making/ A&P                             Medical Decision Making Amount and/or Complexity of Data Reviewed Labs: ordered. Radiology: ordered.  Risk Prescription drug management.   Cellulitis of the left knee which seems to be responding appropriately to antibiotics.  Left knee effusion in the setting of chronic anticoagulation without any signs or symptoms suggestive of septic joint.  I have reviewed and interpreted his laboratory tests, my  interpretation is elevated random glucose, mild leukocytosis which is nonspecific.  I have reviewed his past records, and he was admitted for probable transient ischemic attack on 12/19/2021 with symptoms of dizziness and slurred speech which were waxing and waning.  On 07/05/2022 he had an office visit with neurology who did describe dizziness that did not appear to have a neurologic cause.  At this point, the dizziness and intermittent slurred speech seem to be chronic and I do not see evidence of acute stroke.  However, since he is anticoagulated I have ordered a CT of the head to rule out occult bleed.  CT of head shows no acute process.  Have independently viewed the images, and agree with the radiologist's interpretation.  Patient has fallen asleep, unable to assess response to meclizine.  I am discharging him with a prescription for meclizine and I am recommending that he follow-up with his neurologist and with his cardiologist.  His cellulitis does seem to be improving and he is to continue taking his cephalexin and follow-up with his orthopedic surgeon  as already scheduled.  I have advised him to return if symptoms of cellulitis are getting worse in spite of antibiotics.  Final Clinical Impression(s) / ED Diagnoses Final diagnoses:  Cellulitis of left knee  Effusion of left knee  Dizziness  Chronic anticoagulation    Rx / DC Orders ED Discharge Orders          Ordered    meclizine (ANTIVERT) 25 MG tablet  3 times daily PRN        03/11/23 0110    ondansetron (ZOFRAN-ODT) 8 MG disintegrating tablet  Every 8 hours PRN        03/11/23 0111              Dione Booze, MD 03/11/23 [phone]

## 2023-03-11 ENCOUNTER — Emergency Department (HOSPITAL_COMMUNITY): Payer: Medicare PPO

## 2023-03-11 DIAGNOSIS — R29818 Other symptoms and signs involving the nervous system: Secondary | ICD-10-CM | POA: Diagnosis not present

## 2023-03-11 MED ORDER — MECLIZINE HCL 12.5 MG PO TABS
25.0000 mg | ORAL_TABLET | Freq: Once | ORAL | Status: AC
  Administered 2023-03-11: 25 mg via ORAL
  Filled 2023-03-11: qty 2

## 2023-03-11 MED ORDER — MECLIZINE HCL 25 MG PO TABS: 25.0000 mg | ORAL_TABLET | Freq: Three times a day (TID) | ORAL | 0 refills | Status: DC | PRN

## 2023-03-11 MED ORDER — CEPHALEXIN 500 MG PO CAPS
500.0000 mg | ORAL_CAPSULE | Freq: Once | ORAL | Status: AC
  Administered 2023-03-11: 500 mg via ORAL
  Filled 2023-03-11: qty 1

## 2023-03-11 MED ORDER — ONDANSETRON 8 MG PO TBDP: 8.0000 mg | ORAL_TABLET | Freq: Three times a day (TID) | ORAL | 0 refills | Status: DC | PRN

## 2023-03-11 NOTE — ED Notes (Signed)
Patient transported to CT 

## 2023-03-11 NOTE — Discharge Instructions (Addendum)
Continue take your cephalexin 4 times a day.  If your knee starts to get red again, or if you start running fever or having chills, then you need to return to the emergency department for further evaluation.  Take the meclizine for your dizziness.  If it does not seem to be helping after 2-3 days, you can stop taking it.  Follow-up with your orthopedic physician next week, as scheduled.  Please make an appointment to see your neurologist about your dizziness.  Return if you have any new or concerning symptoms.

## 2023-03-11 NOTE — ED Notes (Signed)
ED Provider at bedside. 

## 2023-03-12 ENCOUNTER — Encounter (HOSPITAL_COMMUNITY): Payer: Self-pay

## 2023-03-12 ENCOUNTER — Emergency Department (HOSPITAL_COMMUNITY)
Admission: EM | Admit: 2023-03-12 | Discharge: 2023-03-12 | Disposition: A | Discharge status: Home / Self Care | Payer: Medicare PPO | Attending: Emergency Medicine | Admitting: Emergency Medicine

## 2023-03-12 ENCOUNTER — Other Ambulatory Visit: Payer: Self-pay

## 2023-03-12 ENCOUNTER — Emergency Department (HOSPITAL_COMMUNITY): Payer: Medicare PPO

## 2023-03-12 DIAGNOSIS — Z7901 Long term (current) use of anticoagulants: Secondary | ICD-10-CM | POA: Insufficient documentation

## 2023-03-12 DIAGNOSIS — I2510 Atherosclerotic heart disease of native coronary artery without angina pectoris: Secondary | ICD-10-CM | POA: Insufficient documentation

## 2023-03-12 DIAGNOSIS — Z79899 Other long term (current) drug therapy: Secondary | ICD-10-CM | POA: Diagnosis not present

## 2023-03-12 DIAGNOSIS — G319 Degenerative disease of nervous system, unspecified: Secondary | ICD-10-CM | POA: Diagnosis not present

## 2023-03-12 DIAGNOSIS — I10 Essential (primary) hypertension: Secondary | ICD-10-CM | POA: Diagnosis not present

## 2023-03-12 DIAGNOSIS — L03116 Cellulitis of left lower limb: Secondary | ICD-10-CM | POA: Insufficient documentation

## 2023-03-12 DIAGNOSIS — D72829 Elevated white blood cell count, unspecified: Secondary | ICD-10-CM | POA: Diagnosis not present

## 2023-03-12 DIAGNOSIS — R42 Dizziness and giddiness: Secondary | ICD-10-CM | POA: Diagnosis not present

## 2023-03-12 DIAGNOSIS — R55 Syncope and collapse: Secondary | ICD-10-CM | POA: Diagnosis not present

## 2023-03-12 LAB — CBC
HCT: 46.4 % (ref 39.0–52.0)
Hemoglobin: 15.7 g/dL (ref 13.0–17.0)
MCH: 29.2 pg (ref 26.0–34.0)
MCHC: 33.8 g/dL (ref 30.0–36.0)
MCV: 86.4 fL (ref 80.0–100.0)
Platelets: 264 10*3/uL (ref 150–400)
RBC: 5.37 MIL/uL (ref 4.22–5.81)
RDW: 13.1 % (ref 11.5–15.5)
WBC: 12.1 10*3/uL — ABNORMAL HIGH (ref 4.0–10.5)
nRBC: 0 % (ref 0.0–0.2)

## 2023-03-12 LAB — BASIC METABOLIC PANEL
Anion gap: 10 (ref 5–15)
BUN: 16 mg/dL (ref 8–23)
CO2: 22 mmol/L (ref 22–32)
Calcium: 9.7 mg/dL (ref 8.9–10.3)
Chloride: 106 mmol/L (ref 98–111)
Creatinine, Ser: 1.2 mg/dL (ref 0.61–1.24)
GFR, Estimated: >60 mL/min (ref >60)
Glucose, Bld: 153 mg/dL — ABNORMAL HIGH (ref 70–99)
Potassium: 3.8 mmol/L (ref 3.5–5.1)
Sodium: 138 mmol/L (ref 135–145)

## 2023-03-12 LAB — URINALYSIS, ROUTINE W REFLEX MICROSCOPIC
Bilirubin Urine: NEGATIVE
Glucose, UA: NEGATIVE mg/dL
Hgb urine dipstick: NEGATIVE
Ketones, ur: NEGATIVE mg/dL
Leukocytes,Ua: NEGATIVE
Nitrite: NEGATIVE
Protein, ur: 30 mg/dL — AB
Specific Gravity, Urine: 1.023 (ref 1.005–1.030)
pH: 5 (ref 5.0–8.0)

## 2023-03-12 LAB — CBG MONITORING, ED: Glucose-Capillary: 164 mg/dL — ABNORMAL HIGH (ref 70–99)

## 2023-03-12 NOTE — ED Triage Notes (Addendum)
Patient had a fall on may 11th.  Patient reports continuing to have dizziness, loss of balance, cold sweats and tingling along with body sweats.  Reports several times he has felt like he is going to pass out.  Patient reports he has been dx with cellulitis to left knee.  On Friday he was given keflex but his dizziness and near syncopal episodes continue to happen. Daughter reports he was seen Saturday at [facility] and everything checked out but he had another episode today.  Reports intermittent slurred speech. 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

## 2023-03-12 NOTE — ED Provider Notes (Signed)
4:05 PM Assumed care of patient from off-going team. For more details, please see note from same day.  In brief, this is a 82 y.o. male p/w 6 months of chronic dizziness that worsened acutely on 5/11 when he fell and possibly had a syncopal episode. Since then has had dizziness w/ standing, sometimes without standing as well, sometimes episodes of slurred speech/confusion that resolve. Seen at [facility] a few days ago, had neg CT scan. L knee swelling for a few weeks. Saw ortho who started him on keflex for cellulitis. Low c/f septic joint.   Plan/Dispo at time of sign-out & ED Course since sign-out: [ ]  MRI, UA, orthostatic vitals signs  BP 118/79   Pulse 85   Temp 97.7 F (36.5 C)   Resp 18   Ht 5\' 8"  (1.727 m)   Wt 98 kg   SpO2 95%   BMI 32.84 kg/m    ED Course:   Clinical Course as of 03/12/23 2040  Mon Mar 12, 2023  1619 Signed out pending MRI brain.  [WS]  1703 MR Brain Wo Contrast (neuro protocol) 1. No acute intracranial abnormality. 2. Moderate cerebral atrophy.   [HN]  1828 Urinalysis, Routine w reflex microscopic -Urine, Clean Catch(!) No UTI [HN]  1828 Neg orthostatics [HN]  2035 Discussed at length with patient and his daughter at bedside.  He states that he has been dealing with symptoms of dizziness, sometimes he thinks he blacks out, as well as vertiginous sensation sometimes with standing and sometimes without standing, chronically.  He thinks over the last several weeks to months has been increasing in frequency and happens possibly every day now.  He has been evaluated by neurology in the past but would like a second opinion, was last seen last January 2023.  Has also been evaluated by Dr. Anne Fu for cardiology but last time was also last year that he was seen.  I recommended the patient follow-up with neurology as well as his cardiologist for follow-up.  He already has follow-up tomorrow with orthopedics for the cellulitis on his knee.  I also examined the knee and his  erythema is localized to the anterior aspect of the joint rather than the entire joint and he has active and passive range of motion without significant pain, and do not believe he has any need for a joint tap at this time.  Recommended continuing that with the Keflex.  I also recommended that he follow-up with his primary care physician for his elevated glucose as this could represent prediabetes or early type 2 diabetes.  Given discharge instructions and return precautions, all questions answered to their satisfaction. [HN]    Clinical Course User Index [HN] Loetta Rough, MD [WS] Lonell Grandchild, MD    Dispo: DC ------------------------------- Vivi Barrack, MD Emergency Medicine  This note was created using dictation software, which may contain spelling or grammatical errors.   Loetta Rough, MD 03/12/23 2040

## 2023-03-12 NOTE — ED Notes (Signed)
Patient transported to MRI 
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
## 2023-03-12 NOTE — ED Notes (Signed)
Patient verbalizes understanding of discharge instructions. Opportunity for questioning and answers were provided. Armband removed by staff, pt discharged from ED. Pt taken to ED entrance via wheel chair.  

## 2023-03-12 NOTE — Discharge Instructions (Signed)
Thank you for coming to [facility] Emergency Department. You were seen for dizziness. We did an exam, labs, and imaging, and these showed no acute findings.  Please follow-up with neurology as well as cardiology.  Please continue taking your Keflex as prescribed for your cellulitis. Please follow up with your orthopedist and primary care provider this week as previously scheduled.  Do not hesitate to return to the ED or call 911 if you experience: -Worsening symptoms -Lightheadedness, passing out -Fevers/chills -Anything else that concerns you

## 2023-03-14 ENCOUNTER — Ambulatory Visit (INDEPENDENT_AMBULATORY_CARE_PROVIDER_SITE_OTHER): Payer: Medicare PPO | Admitting: Internal Medicine

## 2023-03-14 ENCOUNTER — Encounter: Payer: Self-pay | Admitting: Internal Medicine

## 2023-03-14 VITALS — BP 104/68 | HR 75 | Temp 97.5°F | Ht 68.0 in | Wt 205.8 lb

## 2023-03-14 DIAGNOSIS — I2510 Atherosclerotic heart disease of native coronary artery without angina pectoris: Secondary | ICD-10-CM | POA: Diagnosis not present

## 2023-03-14 DIAGNOSIS — Z8673 Personal history of transient ischemic attack (TIA), and cerebral infarction without residual deficits: Secondary | ICD-10-CM

## 2023-03-14 DIAGNOSIS — Z955 Presence of coronary angioplasty implant and graft: Secondary | ICD-10-CM

## 2023-03-14 DIAGNOSIS — Z79899 Other long term (current) drug therapy: Secondary | ICD-10-CM | POA: Diagnosis not present

## 2023-03-14 DIAGNOSIS — R42 Dizziness and giddiness: Secondary | ICD-10-CM

## 2023-03-14 DIAGNOSIS — R4781 Slurred speech: Secondary | ICD-10-CM | POA: Diagnosis not present

## 2023-03-14 DIAGNOSIS — R55 Syncope and collapse: Secondary | ICD-10-CM | POA: Diagnosis not present

## 2023-03-14 DIAGNOSIS — M7052 Other bursitis of knee, left knee: Secondary | ICD-10-CM | POA: Diagnosis not present

## 2023-03-14 DIAGNOSIS — Z9181 History of falling: Secondary | ICD-10-CM | POA: Diagnosis not present

## 2023-03-14 NOTE — Patient Instructions (Signed)
I do have concern about why you had   LOC without definite cause .   I will send messaging  to Dr. Adriana Mccallum  and may consider have you see  Cards  r/o   arrhythmia  I agree acts like tia   but  not defining.   Check bp twice a day for 4-5 days . And record .  We can also get ent to check you again  referral.
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
## 2023-03-15 ENCOUNTER — Telehealth: Payer: Self-pay | Admitting: Internal Medicine

## 2023-03-15 NOTE — Telephone Encounter (Signed)
Pt requesting medication for gout in his right big toe. Declined OV

## 2023-03-16 ENCOUNTER — Telehealth: Payer: Self-pay
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
## 2023-03-19 ENCOUNTER — Other Ambulatory Visit: Payer: Self-pay | Admitting: *Deleted

## 2023-03-19 DIAGNOSIS — R42 Dizziness and giddiness: Secondary | ICD-10-CM

## 2023-03-19 DIAGNOSIS — R55 Syncope and collapse: Secondary | ICD-10-CM

## 2023-03-20 ENCOUNTER — Other Ambulatory Visit: Payer: Self-pay | Admitting: Internal Medicine

## 2023-03-20 DIAGNOSIS — L03116 Cellulitis of left lower limb: Secondary | ICD-10-CM | POA: Diagnosis not present

## 2023-03-20 DIAGNOSIS — M109 Gout, unspecified: Secondary | ICD-10-CM | POA: Diagnosis not present

## 2023-03-27 ENCOUNTER — Ambulatory Visit: Payer: Medicare PPO | Admitting: Nurse Practitioner

## 2023-05-14 ENCOUNTER — Institutional Professional Consult (permissible substitution): Payer: Medicare PPO | Admitting: Cardiovascular Disease

## 2023-05-16 ENCOUNTER — Encounter (INDEPENDENT_AMBULATORY_CARE_PROVIDER_SITE_OTHER): Payer: Self-pay

## 2023-05-30 ENCOUNTER — Other Ambulatory Visit (INDEPENDENT_AMBULATORY_CARE_PROVIDER_SITE_OTHER): Payer: Medicare PPO

## 2023-05-30 DIAGNOSIS — E785 Hyperlipidemia, unspecified: Secondary | ICD-10-CM

## 2023-05-30 DIAGNOSIS — I2510 Atherosclerotic heart disease of native coronary artery without angina pectoris: Secondary | ICD-10-CM

## 2023-05-30 DIAGNOSIS — Z79899 Other long term (current) drug therapy: Secondary | ICD-10-CM

## 2023-05-30 DIAGNOSIS — R7301 Impaired fasting glucose: Secondary | ICD-10-CM | POA: Diagnosis not present

## 2023-05-30 DIAGNOSIS — Z8673 Personal history of transient ischemic attack (TIA), and cerebral infarction without residual deficits: Secondary | ICD-10-CM | POA: Diagnosis not present

## 2023-05-30 DIAGNOSIS — Z8739 Personal history of other diseases of the musculoskeletal system and connective tissue: Secondary | ICD-10-CM

## 2023-05-30 LAB — BASIC METABOLIC PANEL
BUN: 14 mg/dL (ref 6–23)
CO2: 27 mEq/L (ref 19–32)
Calcium: 10 mg/dL (ref 8.4–10.5)
Chloride: 105 mEq/L (ref 96–112)
Creatinine, Ser: 0.97 mg/dL (ref 0.40–1.50)
GFR: 72.47 mL/min (ref >60.00)
Glucose, Bld: 108 mg/dL — ABNORMAL HIGH (ref 70–99)
Potassium: 3.9 mEq/L (ref 3.5–5.1)
Sodium: 138 mEq/L (ref 135–145)

## 2023-05-30 LAB — URIC ACID: Uric Acid, Serum: 6.8 mg/dL (ref 4.0–7.8)

## 2023-05-30 LAB — LIPID PANEL
Cholesterol: 93 mg/dL (ref 0–200)
HDL: 41.3 mg/dL (ref >39.00)
LDL Cholesterol: 28 mg/dL (ref 0–99)
NonHDL: 51.32
Total CHOL/HDL Ratio: 2
Triglycerides: 119 mg/dL (ref 0.0–149.0)
VLDL: 23.8 mg/dL (ref 0.0–40.0)

## 2023-05-30 LAB — CBC WITH DIFFERENTIAL/PLATELET
Basophils Absolute: 0.1 10*3/uL (ref 0.0–0.1)
Basophils Relative: 0.8 % (ref 0.0–3.0)
Eosinophils Absolute: 0.2 10*3/uL (ref 0.0–0.7)
Eosinophils Relative: 3.9 % (ref 0.0–5.0)
HCT: 45.2 % (ref 39.0–52.0)
Hemoglobin: 14.8 g/dL (ref 13.0–17.0)
Lymphocytes Relative: 34.8 % (ref 12.0–46.0)
Lymphs Abs: 2.2 10*3/uL (ref 0.7–4.0)
MCHC: 32.9 g/dL (ref 30.0–36.0)
MCV: 85.9 fl (ref 78.0–100.0)
Monocytes Absolute: 0.7 10*3/uL (ref 0.1–1.0)
Monocytes Relative: 10.8 % (ref 3.0–12.0)
Neutro Abs: 3.2 10*3/uL (ref 1.4–7.7)
Neutrophils Relative %: 49.7 % (ref 43.0–77.0)
Platelets: 199 10*3/uL (ref 150.0–400.0)
RBC: 5.26 Mil/uL (ref 4.22–5.81)
RDW: 14.1 % (ref 11.5–15.5)
WBC: 6.3 10*3/uL (ref 4.0–10.5)

## 2023-05-30 LAB — HEMOGLOBIN A1C: Hgb A1c MFr Bld: 6 % (ref 4.6–6.5)

## 2023-05-30 LAB — HEPATIC FUNCTION PANEL
ALT: 21 U/L (ref 0–53)
AST: 20 U/L (ref 0–37)
Albumin: 4.2 g/dL (ref 3.5–5.2)
Alkaline Phosphatase: 62 U/L (ref 39–117)
Bilirubin, Direct: 0.2 mg/dL (ref 0.0–0.3)
Total Bilirubin: 0.7 mg/dL (ref 0.2–1.2)
Total Protein: 6.9 g/dL (ref 6.0–8.3)

## 2023-05-31 NOTE — Progress Notes (Signed)
Results   acceptable  to review at upcoming visit August 21
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
## 2023-06-06 ENCOUNTER — Encounter: Payer: Self-pay | Admitting: Internal Medicine

## 2023-06-06 ENCOUNTER — Ambulatory Visit: Payer: Medicare PPO | Admitting: Internal Medicine

## 2023-06-06 VITALS — BP 130/60 | HR 73 | Temp 97.6°F | Ht 68.0 in | Wt 204.4 lb

## 2023-06-06 DIAGNOSIS — I10 Essential (primary) hypertension: Secondary | ICD-10-CM

## 2023-06-06 DIAGNOSIS — R7301 Impaired fasting glucose: Secondary | ICD-10-CM

## 2023-06-06 DIAGNOSIS — Z9181 History of falling: Secondary | ICD-10-CM | POA: Diagnosis not present

## 2023-06-06 DIAGNOSIS — Z8673 Personal history of transient ischemic attack (TIA), and cerebral infarction without residual deficits: Secondary | ICD-10-CM

## 2023-06-06 DIAGNOSIS — Z79899 Other long term (current) drug therapy: Secondary | ICD-10-CM

## 2023-06-06 DIAGNOSIS — Z8739 Personal history of other diseases of the musculoskeletal system and connective tissue: Secondary | ICD-10-CM

## 2023-06-06 DIAGNOSIS — E785 Hyperlipidemia, unspecified: Secondary | ICD-10-CM

## 2023-06-06 NOTE — Patient Instructions (Addendum)
Blood work  is ok   no diabetes .   Avoid added sugar foods  and sweets and processed carb.   Lab Results  Component Value Date   LABURIC 6.8 05/30/2023   Disc  loop recorder with cardiology  ? If meds could be contributing. And taking different time of day .  Fall prevention .   Keep  ENT appt.   Balance caution.  Consider  physical therapy if needed.

## 2023-06-08 DIAGNOSIS — R42 Dizziness and giddiness: Secondary | ICD-10-CM | POA: Diagnosis not present

## 2023-06-08 DIAGNOSIS — H903 Sensorineural hearing loss, bilateral: Secondary | ICD-10-CM | POA: Diagnosis not present

## 2023-06-08 DIAGNOSIS — H6121 Impacted cerumen, right ear: Secondary | ICD-10-CM | POA: Diagnosis not present

## 2023-06-19 ENCOUNTER — Other Ambulatory Visit: Payer: Self-pay | Admitting: Cardiology

## 2023-06-25 DIAGNOSIS — L304 Erythema intertrigo: Secondary | ICD-10-CM | POA: Diagnosis not present

## 2023-06-25 DIAGNOSIS — Z85828 Personal history of other malignant neoplasm of skin: Secondary | ICD-10-CM | POA: Diagnosis not present

## 2023-06-25 DIAGNOSIS — L570 Actinic keratosis: Secondary | ICD-10-CM | POA: Diagnosis not present

## 2023-06-25 DIAGNOSIS — D485 Neoplasm of uncertain behavior of skin: Secondary | ICD-10-CM | POA: Diagnosis not present

## 2023-07-06 ENCOUNTER — Encounter: Payer: Self-pay | Admitting: Cardiology

## 2023-08-21 ENCOUNTER — Ambulatory Visit: Payer: Medicare PPO | Attending: Cardiology | Admitting: Cardiology

## 2023-08-30 ENCOUNTER — Other Ambulatory Visit: Payer: Self-pay | Admitting: Cardiology

## 2023-08-30 DIAGNOSIS — I4819 Other persistent atrial fibrillation: Secondary | ICD-10-CM

## 2023-08-30 NOTE — Telephone Encounter (Signed)
Eliquis 5mg  refill request received. Patient is 82 years old, weight-92.7kg, Crea-0.97 on 06/02/23, Diagnosis-Afib, and last seen by Dr. Anne Fu on 08/29/22 and pending appt with Jari Favre 11/02/23. Dose is appropriate based on dosing criteria. Will send in refill to requested pharmacy.

## 2023-09-15 ENCOUNTER — Other Ambulatory Visit: Payer: Self-pay | Admitting: Cardiology

## 2023-09-15 ENCOUNTER — Other Ambulatory Visit: Payer: Self-pay | Admitting: Family

## 2023-09-19 ENCOUNTER — Other Ambulatory Visit: Payer: Self-pay | Admitting: Cardiology

## 2023-09-20 ENCOUNTER — Telehealth: Payer: Self-pay | Admitting: Internal Medicine

## 2023-09-20 MED ORDER — ISOSORBIDE MONONITRATE ER 30 MG PO TB24: ORAL_TABLET | ORAL | 5 refills | Status: DC

## 2023-09-20 NOTE — Telephone Encounter (Signed)
Rx sent. Spoke to pt. Pt is aware.

## 2023-09-20 NOTE — Telephone Encounter (Signed)
Patient called requesting a refill on, patient has 3 tablets left:  isosorbide mononitrate (IMDUR) 30 MG 24 hr tablet   [facility] - [place], [place] - [address] Phone: [phone]  Fax: [phone]      Please advise at [phone]

## 2023-09-24 ENCOUNTER — Other Ambulatory Visit: Payer: Self-pay

## 2023-09-24 ENCOUNTER — Encounter (HOSPITAL_COMMUNITY): Payer: Self-pay | Admitting: Emergency Medicine

## 2023-09-24 ENCOUNTER — Inpatient Hospital Stay (HOSPITAL_COMMUNITY)
Admission: EM | Admit: 2023-09-24 | Discharge: 2023-09-28 | DRG: 177 | Disposition: A | Discharge status: Home Health | Payer: Medicare PPO | Attending: Family Medicine | Admitting: Family Medicine

## 2023-09-24 ENCOUNTER — Emergency Department (HOSPITAL_COMMUNITY): Payer: Medicare PPO

## 2023-09-24 DIAGNOSIS — I110 Hypertensive heart disease with heart failure: Secondary | ICD-10-CM | POA: Diagnosis present

## 2023-09-24 DIAGNOSIS — K219 Gastro-esophageal reflux disease without esophagitis: Secondary | ICD-10-CM | POA: Diagnosis not present

## 2023-09-24 DIAGNOSIS — Z833 Family history of diabetes mellitus: Secondary | ICD-10-CM

## 2023-09-24 DIAGNOSIS — R531 Weakness: Secondary | ICD-10-CM

## 2023-09-24 DIAGNOSIS — J9601 Acute respiratory failure with hypoxia: Secondary | ICD-10-CM | POA: Diagnosis present

## 2023-09-24 DIAGNOSIS — Z6830 Body mass index (BMI) 30.0-30.9, adult: Secondary | ICD-10-CM

## 2023-09-24 DIAGNOSIS — U071 COVID-19: Principal | ICD-10-CM | POA: Diagnosis present

## 2023-09-24 DIAGNOSIS — R0689 Other abnormalities of breathing: Secondary | ICD-10-CM | POA: Diagnosis not present

## 2023-09-24 DIAGNOSIS — Z823 Family history of stroke: Secondary | ICD-10-CM | POA: Diagnosis not present

## 2023-09-24 DIAGNOSIS — Z8249 Family history of ischemic heart disease and other diseases of the circulatory system: Secondary | ICD-10-CM | POA: Diagnosis not present

## 2023-09-24 DIAGNOSIS — Z8673 Personal history of transient ischemic attack (TIA), and cerebral infarction without residual deficits: Secondary | ICD-10-CM | POA: Diagnosis not present

## 2023-09-24 DIAGNOSIS — K8689 Other specified diseases of pancreas: Secondary | ICD-10-CM | POA: Diagnosis not present

## 2023-09-24 DIAGNOSIS — G459 Transient cerebral ischemic attack, unspecified: Secondary | ICD-10-CM | POA: Diagnosis present

## 2023-09-24 DIAGNOSIS — Z7901 Long term (current) use of anticoagulants: Secondary | ICD-10-CM | POA: Diagnosis not present

## 2023-09-24 DIAGNOSIS — Z87891 Personal history of nicotine dependence: Secondary | ICD-10-CM | POA: Diagnosis not present

## 2023-09-24 DIAGNOSIS — E781 Pure hyperglyceridemia: Secondary | ICD-10-CM | POA: Diagnosis present

## 2023-09-24 DIAGNOSIS — I5032 Chronic diastolic (congestive) heart failure: Secondary | ICD-10-CM | POA: Diagnosis present

## 2023-09-24 DIAGNOSIS — E66811 Obesity, class 1: Secondary | ICD-10-CM | POA: Diagnosis not present

## 2023-09-24 DIAGNOSIS — K8020 Calculus of gallbladder without cholecystitis without obstruction: Secondary | ICD-10-CM | POA: Diagnosis not present

## 2023-09-24 DIAGNOSIS — I4819 Other persistent atrial fibrillation: Secondary | ICD-10-CM | POA: Diagnosis not present

## 2023-09-24 DIAGNOSIS — I2510 Atherosclerotic heart disease of native coronary artery without angina pectoris: Secondary | ICD-10-CM | POA: Diagnosis not present

## 2023-09-24 DIAGNOSIS — R112 Nausea with vomiting, unspecified: Secondary | ICD-10-CM | POA: Diagnosis not present

## 2023-09-24 DIAGNOSIS — Z955 Presence of coronary angioplasty implant and graft: Secondary | ICD-10-CM

## 2023-09-24 DIAGNOSIS — K5730 Diverticulosis of large intestine without perforation or abscess without bleeding: Secondary | ICD-10-CM | POA: Diagnosis not present

## 2023-09-24 DIAGNOSIS — R42 Dizziness and giddiness: Secondary | ICD-10-CM | POA: Diagnosis not present

## 2023-09-24 LAB — URINALYSIS, ROUTINE W REFLEX MICROSCOPIC
Bilirubin Urine: NEGATIVE
Glucose, UA: NEGATIVE mg/dL
Ketones, ur: 20 mg/dL — AB
Leukocytes,Ua: NEGATIVE
Nitrite: NEGATIVE
Protein, ur: 100 mg/dL — AB
Specific Gravity, Urine: 1.04 — ABNORMAL HIGH (ref 1.005–1.030)
pH: 5 (ref 5.0–8.0)

## 2023-09-24 LAB — COMPREHENSIVE METABOLIC PANEL
ALT: 28 U/L (ref 0–44)
AST: 21 U/L (ref 15–41)
Albumin: 4.1 g/dL (ref 3.5–5.0)
Alkaline Phosphatase: 59 U/L (ref 38–126)
Anion gap: 10 (ref 5–15)
BUN: 12 mg/dL (ref 8–23)
CO2: 24 mmol/L (ref 22–32)
Calcium: 9.9 mg/dL (ref 8.9–10.3)
Chloride: 103 mmol/L (ref 98–111)
Creatinine, Ser: 1 mg/dL (ref 0.61–1.24)
GFR, Estimated: >60 mL/min (ref >60)
Glucose, Bld: 147 mg/dL — ABNORMAL HIGH (ref 70–99)
Potassium: 3.9 mmol/L (ref 3.5–5.1)
Sodium: 137 mmol/L (ref 135–145)
Total Bilirubin: 1.4 mg/dL — ABNORMAL HIGH (ref <1.2)
Total Protein: 7.4 g/dL (ref 6.5–8.1)

## 2023-09-24 LAB — CBC WITH DIFFERENTIAL/PLATELET
Abs Immature Granulocytes: 0.03 10*3/uL (ref 0.00–0.07)
Basophils Absolute: 0 10*3/uL (ref 0.0–0.1)
Basophils Relative: 0 %
Eosinophils Absolute: 0 10*3/uL (ref 0.0–0.5)
Eosinophils Relative: 0 %
HCT: 47.2 % (ref 39.0–52.0)
Hemoglobin: 15.7 g/dL (ref 13.0–17.0)
Immature Granulocytes: 0 %
Lymphocytes Relative: 7 %
Lymphs Abs: 0.7 10*3/uL (ref 0.7–4.0)
MCH: 28.8 pg (ref 26.0–34.0)
MCHC: 33.3 g/dL (ref 30.0–36.0)
MCV: 86.4 fL (ref 80.0–100.0)
Monocytes Absolute: 0.9 10*3/uL (ref 0.1–1.0)
Monocytes Relative: 9 %
Neutro Abs: 8.5 10*3/uL — ABNORMAL HIGH (ref 1.7–7.7)
Neutrophils Relative %: 84 %
Platelets: 188 10*3/uL (ref 150–400)
RBC: 5.46 MIL/uL (ref 4.22–5.81)
RDW: 13.3 % (ref 11.5–15.5)
WBC: 10.3 10*3/uL (ref 4.0–10.5)
nRBC: 0 % (ref 0.0–0.2)

## 2023-09-24 LAB — LIPASE, BLOOD: Lipase: 19 U/L (ref 11–51)

## 2023-09-24 LAB — SARS CORONAVIRUS 2 BY RT PCR: SARS Coronavirus 2 by RT PCR: POSITIVE — AB

## 2023-09-24 MED ORDER — SODIUM CHLORIDE 0.9 % IV SOLN
200.0000 mg | Freq: Once | INTRAVENOUS | Status: DC
  Filled 2023-09-24: qty 40

## 2023-09-24 MED ORDER — PREDNISONE 10 MG PO TABS
50.0000 mg | ORAL_TABLET | Freq: Every day | ORAL | Status: DC
  Administered 2023-09-27 – 2023-09-28 (×2): 50 mg via ORAL
  Filled 2023-09-24 (×2): qty 1

## 2023-09-24 MED ORDER — IPRATROPIUM-ALBUTEROL 0.5-2.5 (3) MG/3ML IN SOLN
3.0000 mL | Freq: Four times a day (QID) | RESPIRATORY_TRACT | Status: DC
  Administered 2023-09-24 – 2023-09-25 (×2): 3 mL via RESPIRATORY_TRACT
  Filled 2023-09-24 (×2): qty 3

## 2023-09-24 MED ORDER — ENOXAPARIN SODIUM 40 MG/0.4ML IJ SOSY
40.0000 mg | PREFILLED_SYRINGE | INTRAMUSCULAR | Status: DC
  Administered 2023-09-24: 40 mg via SUBCUTANEOUS
  Filled 2023-09-24: qty 0.4

## 2023-09-24 MED ORDER — MELATONIN 5 MG PO TABS: 5.0000 mg | ORAL_TABLET | Freq: Every evening | ORAL | Status: DC | PRN

## 2023-09-24 MED ORDER — ADULT MULTIVITAMIN W/MINERALS CH
1.0000 | ORAL_TABLET | Freq: Every day | ORAL | Status: DC
  Administered 2023-09-24 – 2023-09-28 (×5): 1 via ORAL
  Filled 2023-09-24 (×5): qty 1

## 2023-09-24 MED ORDER — IOHEXOL 350 MG/ML SOLN
80.0000 mL | Freq: Once | INTRAVENOUS | Status: AC | PRN
  Administered 2023-09-24: 75 mL via INTRAVENOUS

## 2023-09-24 MED ORDER — THIAMINE MONONITRATE 100 MG PO TABS
100.0000 mg | ORAL_TABLET | Freq: Every day | ORAL | Status: DC
Start: 2023-09-24 — End: 2023-09-28
  Administered 2023-09-24 – 2023-09-28 (×5): 100 mg via ORAL
  Filled 2023-09-24 (×5): qty 1

## 2023-09-24 MED ORDER — ONDANSETRON HCL 4 MG/2ML IJ SOLN
4.0000 mg | Freq: Once | INTRAMUSCULAR | Status: AC
  Administered 2023-09-24: 4 mg via INTRAVENOUS
  Filled 2023-09-24: qty 2

## 2023-09-24 MED ORDER — GUAIFENESIN-DM 100-10 MG/5ML PO SYRP
5.0000 mL | ORAL_SOLUTION | ORAL | Status: DC | PRN
  Filled 2023-09-24 (×2): qty 5

## 2023-09-24 MED ORDER — METHYLPREDNISOLONE SODIUM SUCC 125 MG IJ SOLR
0.5000 mg/kg | Freq: Two times a day (BID) | INTRAMUSCULAR | Status: AC
  Administered 2023-09-24 – 2023-09-27 (×6): 46.25 mg via INTRAVENOUS
  Filled 2023-09-24 (×6): qty 2

## 2023-09-24 MED ORDER — FOLIC ACID 1 MG PO TABS
1.0000 mg | ORAL_TABLET | Freq: Every day | ORAL | Status: DC
Start: 2023-09-24 — End: 2023-09-28
  Administered 2023-09-24 – 2023-09-28 (×5): 1 mg via ORAL
  Filled 2023-09-24 (×5): qty 1

## 2023-09-24 MED ORDER — ACETAMINOPHEN 325 MG PO TABS: 650.0000 mg | ORAL_TABLET | Freq: Four times a day (QID) | ORAL | Status: DC | PRN

## 2023-09-24 MED ORDER — SODIUM CHLORIDE 0.9 % IV SOLN
100.0000 mg | Freq: Every day | INTRAVENOUS | Status: DC
  Filled 2023-09-24: qty 20

## 2023-09-24 MED ORDER — MECLIZINE HCL 12.5 MG PO TABS
25.0000 mg | ORAL_TABLET | Freq: Once | ORAL | Status: AC
  Administered 2023-09-24: 25 mg via ORAL
  Filled 2023-09-24: qty 2

## 2023-09-24 MED ORDER — POLYETHYLENE GLYCOL 3350 17 G PO PACK: 17.0000 g | PACK | Freq: Every day | ORAL | Status: DC | PRN

## 2023-09-24 MED ORDER — PROCHLORPERAZINE EDISYLATE 10 MG/2ML IJ SOLN
5.0000 mg | Freq: Four times a day (QID) | INTRAMUSCULAR | Status: DC | PRN
  Administered 2023-09-24: 5 mg via INTRAVENOUS
  Filled 2023-09-24: qty 2

## 2023-09-24 MED ORDER — NIRMATRELVIR/RITONAVIR (PAXLOVID)TABLET
3.0000 | ORAL_TABLET | Freq: Two times a day (BID) | ORAL | Status: DC
  Administered 2023-09-25 – 2023-09-28 (×8): 3 via ORAL
  Filled 2023-09-24: qty 30

## 2023-09-24 NOTE — ED Triage Notes (Signed)
Nausea and vomiting, found in floor by ems. Pt states he fell in the floor sometime today because his equilibrium was off.  Pt actively vomiting during triage.  Given 4 mg Zofran IV by ems.

## 2023-09-24 NOTE — ED Provider Notes (Signed)
[facility] EMERGENCY DEPARTMENT AT [facility] Provider Note   CSN: [id number] Arrival date & time: 09/24/23  1339     History  Chief Complaint  Patient presents with   Vomiting    Bryan Cabrera is a 82 y.o. male history of chronic peripheral vertigo, hypertriglyceridemia, A-fib, TIA, CAD, GERD, on Eliquis presented with nausea vomiting that began earlier today as he states his equilibrium is off.  Patient states he has chronic dizziness however today noticed it to be worse to the point where he fell to both of his hands and knees and was on the ground and EMS found him.  Patient has hitting his head, LOC, pain to the head or neck region, new onset weakness.  Patient states he always dizzy when he moves at this time.  Patient does not take his meclizine at home.  Patient's been evaluated by ENT and had multiple imagings in the past that have been ultimately negative.  Patient states he is having bilious vomiting but denies any abdominal pain.  Patient denies any fevers, chest pain, shortness of breath, sick contacts wants be tested for COVID.    Home Medications Prior to Admission medications   Medication Sig Start Date End Date Taking? Authorizing Provider  amLODipine (NORVASC) 2.5 MG tablet TAKE ONE TABLET (2.5MG  TOTAL) BY MOUTH DAILY 09/20/23   [name], MD  apixaban (ELIQUIS) 5 MG TABS tablet Take 1 tablet (5 mg total) by mouth 2 (two) times daily. 08/30/23   [name], MD  isosorbide mononitrate (IMDUR) 30 MG 24 hr tablet TAKE ONE TABLET (30MG  TOTAL) BY MOUTH DAILY 09/20/23   Panosh, [name], MD  latanoprost (XALATAN) 0.005 % ophthalmic solution Place 1 drop into both eyes at bedtime. 08/09/18   Wynn Banker, MD  nitroGLYCERIN (NITROSTAT) 0.4 MG SL tablet Place 1 tablet (0.4 mg total) under the tongue every 5 (five) minutes as needed for chest pain. 12/14/21 12/14/22  Marjie Skiff E, [name]  olmesartan (BENICAR) 40 MG tablet Take 1 tablet (40 mg total) by  mouth daily. 02/14/23   [name], MD  ondansetron (ZOFRAN-ODT) 8 MG disintegrating tablet Take 1 tablet (8 mg total) by mouth every 8 (eight) hours as needed for nausea or vomiting. Patient not taking: Reported on 06/06/2023 03/11/23   Dione Booze, MD  rosuvastatin (CRESTOR) 20 MG tablet TAKE ONE (1) TABLET BY MOUTH EVERY DAY 09/19/23   [name], MD      Allergies    Ciprofloxacin, Metronidazole, Penicillins, Quinapril hcl, Tetracycline hcl, and Amoxicillin    Review of Systems   Review of Systems  Physical Exam Updated Vital Signs BP 123/78   Pulse 83   Temp 98 F (36.7 C) (Oral)   Resp 20   Ht 5\' 8"  (1.727 m)   Wt 92 kg   SpO2 95%   BMI 30.84 kg/m  Physical Exam Constitutional:      General: He is not in acute distress.    Comments: Bilious emesis on patient's shirt  Eyes:     Extraocular Movements: Extraocular movements intact.     Conjunctiva/sclera: Conjunctivae normal.     Pupils: Pupils are equal, round, and reactive to light.  Cardiovascular:     Rate and Rhythm: Normal rate and regular rhythm.     Pulses: Normal pulses.     Heart sounds: Normal heart sounds.  Pulmonary:     Effort: Pulmonary effort is normal. No respiratory distress.  Breath sounds: Normal breath sounds.  Abdominal:     Palpations: Abdomen is soft.     Tenderness: There is no abdominal tenderness. There is no guarding or rebound.  Musculoskeletal:        General: Normal range of motion.  Skin:    General: Skin is warm and dry.  Neurological:     Mental Status: He is alert.     Comments: Head impulse abnormal Test of skew unremarkable Horizontal fatigable nystagmus     ED Results / Procedures / Treatments   Labs (all labs ordered are listed, but only abnormal results are displayed) Labs Reviewed  SARS CORONAVIRUS 2 BY RT PCR - Abnormal; Notable for the following components:      Result Value   SARS Coronavirus 2 by RT PCR POSITIVE (*)    All other components within  normal limits  CBC WITH DIFFERENTIAL/PLATELET - Abnormal; Notable for the following components:   Neutro Abs 8.5 (*)    All other components within normal limits  COMPREHENSIVE METABOLIC PANEL - Abnormal; Notable for the following components:   Glucose, Bld 147 (*)    Total Bilirubin 1.4 (*)    All other components within normal limits  LIPASE, BLOOD  URINALYSIS, ROUTINE W REFLEX MICROSCOPIC    EKG None  Radiology CT ABDOMEN PELVIS W CONTRAST  Result Date: 09/24/2023 CLINICAL DATA:  Nausea and vomiting with recent fall, initial encounter EXAM: CT ABDOMEN AND PELVIS WITH CONTRAST TECHNIQUE: Multidetector CT imaging of the abdomen and pelvis was performed using the standard protocol following bolus administration of intravenous contrast. RADIATION DOSE REDUCTION: This exam was performed according to the departmental dose-optimization program which includes automated exposure control, adjustment of the mA and/or kV according to patient size and/or use of iterative reconstruction technique. CONTRAST:  75mL OMNIPAQUE IOHEXOL 350 MG/ML SOLN COMPARISON:  10/31/2021 FINDINGS: Lower chest: No acute abnormality. Hepatobiliary: Gallbladder is well distended. Dependent small gallstones are seen. No complicating factors are noted. The liver is within normal limits. Pancreas: Pancreas again demonstrates fatty infiltration. Fluid attenuation cyst is noted anteriorly adjacent to the head of the pancreas similar to that seen on the prior exam. Additionally a new 12 mm pancreatic cyst is identified slightly increased from the prior exam at which time it measured less than 5 mm. Spleen: Normal in size without focal abnormality. Adrenals/Urinary Tract: Adrenal glands are within normal limits. Kidneys demonstrate a normal enhancement pattern bilaterally. Parapelvic cyst is noted on the left and cortical cyst on the right stable from the prior study. No further follow-up is recommended. No renal calculi or obstructive  changes are seen. The bladder is well distended. Stomach/Bowel: Scattered diverticular change of the colon is noted without evidence of diverticulitis. The appendix is within normal limits. Small bowel and stomach are unremarkable. Vascular/Lymphatic: Aortic atherosclerosis. No enlarged abdominal or pelvic lymph nodes. Reproductive: Prostate is unremarkable. Other: No abdominal wall hernia or abnormality. No abdominopelvic ascites. Musculoskeletal: No acute or significant osseous findings. IMPRESSION: Cholelithiasis without complicating factors. Diffuse pancreatic atrophy with scattered cysts. Stable cyst in the pancreatic head are noted. A new 12 mm cyst is noted in the midportion of the body posteriorly increased from the prior exam. Given the patient's age no further follow-up is recommended. The cysts have waxed and waned over multiple prior exams. No definitive solid mass is seen. Diverticulosis without diverticulitis. Electronically Signed   By: Alcide Clever M.D.   On: 09/24/2023 19:26   CT Head Wo Contrast  Result Date: 09/24/2023  CLINICAL DATA:  Dizziness.  Nausea and vomiting.  Fall. EXAM: CT HEAD WITHOUT CONTRAST TECHNIQUE: Contiguous axial images were obtained from the base of the skull through the vertex without intravenous contrast. RADIATION DOSE REDUCTION: This exam was performed according to the departmental dose-optimization program which includes automated exposure control, adjustment of the mA and/or kV according to patient size and/or use of iterative reconstruction technique. COMPARISON:  Head CT 03/11/2023 and MRI 03/12/2023 FINDINGS: Brain: There is no evidence of an acute infarct, intracranial hemorrhage, mass, midline shift, or extra-axial fluid collection. Moderate cerebral atrophy is unchanged. A partially empty sella is again noted. Vascular: Calcified atherosclerosis at the skull base. No hyperdense vessel. Skull: No acute fracture or suspicious osseous lesion. Sinuses/Orbits: Mild  mucosal thickening in the included paranasal sinuses. Clear mastoid air cells. Bilateral cataract extraction. Other: None. IMPRESSION: No evidence of acute intracranial abnormality. Electronically Signed   By: Sebastian Ache M.D.   On: 09/24/2023 16:48    Procedures Procedures    Medications Ordered in ED Medications  ondansetron (ZOFRAN) injection 4 mg (4 mg Intravenous Given 09/24/23 1433)  meclizine (ANTIVERT) tablet 25 mg (25 mg Oral Given 09/24/23 1521)  iohexol (OMNIPAQUE) 350 MG/ML injection 80 mL (75 mLs Intravenous Contrast Given 09/24/23 1807)    ED Course/ Medical Decision Making/ A&P                                 Medical Decision Making Amount and/or Complexity of Data Reviewed Labs: ordered. Radiology: ordered.  Risk Prescription drug management.   RIGO LARKE 82 y.o. presented today for dizziness, nausea/vomiting. Working DDx that I considered at this time includes, but not limited to, chronic peripheral vertigo, CVA/TIA, electrolyte abnormalities, anemia, hepatobiliary pathology, gastritis, alcohol induced, viral illness.  R/o DDx: CVA/TIA, electrolyte abnormalities, anemia, hepatobiliary pathology, gastritis, alcohol induce: These are considered less likely due to history of present illness, physical exam, labs/imaging findings  Review of prior external notes: 03/12/2023 ED  Unique Tests and My Interpretation:  CBC: Unremarkable CMP: Unremarkable COVID: Positive UA: Pending CT head without contrast: Unremarkable CT pelvis contrast: Cholelithiasis, diverticulosis  Social Determinants of Health: none  Discussion with Independent Historian: None  Discussion of Management of Tests:  Hall, MD Hospitalist  Risk: High: hospitalization or escalation of hospital-level care  Risk Stratification Score: none  Staffed with Dixon, MD  Plan: On exam patient was in no acute distress but was actively having bilious emesis with stable vitals.  Patient had  unremarkable physical exam outside of the emesis.  Patient was endorsing chronic vertigo and has since exam does show peripheral vertigo.  Patient's neurologic exam was reassuring however unable to ambulate due to weakness.  Patient given Zofran and Antivert for his symptoms and we will obtain labs and imaging.  Patient still is unable to ambulate due to weakness however did test positive for COVID which would explain his symptoms.  Pending CT read will admit for weakness.  CT shows cholelithiasis with diverticulosis however no signs of active infection.  Patient did not have any abdominal tenderness on exam and so at this time we will not order right upper quadrant ultrasound as we do not also have the ultrasound tech here in the ED at this time.  Do believe the emesis is from his COVID and since patient is unable to walk due to weakness will admit to the hospitalist.  Hospitalist consulted.  I spoke to the  hospitalist and patient was accepted for admission.  Patient stable for admission at this time.  This chart was dictated using voice recognition software.  Despite best efforts to proofread,  errors can occur which can change the documentation meaning.         Final Clinical Impression(s) / ED Diagnoses Final diagnoses:  COVID  Weakness    Rx / DC Orders ED Discharge Orders     None         Remi Deter 09/24/23 2012    Gloris Manchester, MD 09/24/23 2321

## 2023-09-24 NOTE — ED Notes (Signed)
Family updated as to patient's status.

## 2023-09-25 DIAGNOSIS — U071 COVID-19: Secondary | ICD-10-CM

## 2023-09-25 LAB — MRSA NEXT GEN BY PCR, NASAL: MRSA by PCR Next Gen: NOT DETECTED

## 2023-09-25 MED ORDER — LATANOPROST 0.005 % OP SOLN
1.0000 [drp] | Freq: Every day | OPHTHALMIC | Status: DC
  Administered 2023-09-25 – 2023-09-27 (×3): 1 [drp] via OPHTHALMIC
  Filled 2023-09-25: qty 2.5

## 2023-09-25 MED ORDER — AMLODIPINE BESYLATE 5 MG PO TABS
2.5000 mg | ORAL_TABLET | Freq: Every day | ORAL | Status: DC
  Administered 2023-09-25 – 2023-09-28 (×4): 2.5 mg via ORAL
  Filled 2023-09-25 (×4): qty 1

## 2023-09-25 MED ORDER — NITROGLYCERIN 0.4 MG SL SUBL: 0.4000 mg | SUBLINGUAL_TABLET | SUBLINGUAL | Status: DC | PRN

## 2023-09-25 MED ORDER — VITAMIN C 500 MG PO TABS
500.0000 mg | ORAL_TABLET | Freq: Every day | ORAL | Status: DC
  Administered 2023-09-25 – 2023-09-28 (×4): 500 mg via ORAL
  Filled 2023-09-25 (×4): qty 1

## 2023-09-25 MED ORDER — ZINC SULFATE 220 (50 ZN) MG PO CAPS
220.0000 mg | ORAL_CAPSULE | Freq: Every day | ORAL | Status: DC
  Administered 2023-09-25 – 2023-09-28 (×4): 220 mg via ORAL
  Filled 2023-09-25 (×4): qty 1

## 2023-09-25 MED ORDER — ISOSORBIDE MONONITRATE ER 60 MG PO TB24
30.0000 mg | ORAL_TABLET | Freq: Every day | ORAL | Status: DC
  Administered 2023-09-25 – 2023-09-28 (×4): 30 mg via ORAL
  Filled 2023-09-25 (×4): qty 1

## 2023-09-25 MED ORDER — MECLIZINE HCL 12.5 MG PO TABS: 25.0000 mg | ORAL_TABLET | Freq: Three times a day (TID) | ORAL | Status: DC | PRN

## 2023-09-25 MED ORDER — APIXABAN 5 MG PO TABS: 5.0000 mg | ORAL_TABLET | Freq: Two times a day (BID) | ORAL | Status: DC

## 2023-09-25 MED ORDER — ENOXAPARIN SODIUM 150 MG/ML IJ SOSY
135.0000 mg | PREFILLED_SYRINGE | INTRAMUSCULAR | Status: DC
  Administered 2023-09-25 – 2023-09-28 (×4): 135 mg via SUBCUTANEOUS
  Filled 2023-09-25 (×7): qty 1

## 2023-09-25 MED ORDER — LEVALBUTEROL HCL 0.63 MG/3ML IN NEBU
0.6300 mg | INHALATION_SOLUTION | Freq: Four times a day (QID) | RESPIRATORY_TRACT | Status: DC
  Administered 2023-09-25 – 2023-09-26 (×6): 0.63 mg via RESPIRATORY_TRACT
  Filled 2023-09-25 (×6): qty 3

## 2023-09-25 NOTE — Progress Notes (Signed)
PROGRESS NOTE    Patient: Bryan Cabrera                            PCP: [name], MD                    DOB: 08-05-1941            DOA: 09/24/2023 WUJ:[id number]             DOS: 09/25/2023, 11:23 AM   LOS: 1 day   Date of Service: The patient was seen and examined on 09/25/2023  Subjective:   The patient was seen and examined this morning. Still complaining of shortness of breath, cough, on 2 L of oxygen currently satting 98%   Brief Narrative:   Bryan Cabrera is a 82 y.o. male with medical history significant for peripheral vertigo, hyperlipidemia, paroxysmal A-fib on Eliquis, TIA, coronary disease, GERD, who presented with intractable nausea and vomiting that began earlier today.  Associated with generalized weakness causing a fall for which EMS was activated.  Admits to sick contacts and wants to be tested for COVID 19 viral infection.  He was brought into the ED for further evaluation.    ED Course: Temperature 97.6.  BP 121/84, pulse 71, respiratory 20, saturation 97% on 2 L.  Lab studies notable for elevated serum glucose 147.  T. bili 1.4. Was hypoxic with O2 saturation of 89% on room air, improved on 2 L to 95%.   COVID-19 viral screening test positive.   The patient was started on antivirals, admitted by [facility], hospitalist service.      Assessment/Plan:   Present on Admission:  COVID-19 virus infection      COVID-19 viral infection, POA -With acute respiratory failure, requiring oxygen -Symptomatic with shortness of breath, cough, intractable nausea vomiting -Initiated on oral treatment with Paxlovid -IV steroids, bronchodilators Antitussives as needed   Acute hypoxic respiratory failure secondary to SARS-CoV-2 Not on O2 supplementation at baseline Currently requiring 2 L to maintain saturation above 92%. Incentive spirometer IV Solu-Medrol, bronchodilators Wean off oxygen supplementation as tolerated Home O2 evaluation on 09/25/2023.    Paroxysmal A-fib on Eliquis - home Eliquis-will be switched to subcu Lovenox as Eliquis interacts with Paxlovid Monitor on telemetry. -    Hyperlipidemia Holding home medication of statin/Crestor-due to increase of rhabdomyolysis with Paxlovid   Chronic HFpEF Euvolemic on exam Resume home regimen Strict I's and O's and daily weight   Obesity BMI 30 Recommend weight loss outpatient with regular physical activity and healthy dieting.      ------------------------------------------------------------------------------------------------------------------------------------------- Nutritional status:  The patient's BMI is: Body mass index is 30.3 kg/m. I agree with the assessment and plan as outlined -------------------------------------------------------------------------------------------------------------------------------------------  DVT prophylaxis:  On Eliquis switching to Lovenox   Code Status:   Code Status: Full Code  Family Communication: No family member present at bedside-  -Advance care planning has been discussed.   Admission status:   Status is: Inpatient Remains inpatient appropriate because: Needing respiratory support, COVID treatment, IV steroids,   Disposition: From  - home             Planning for discharge in 1-2 days: to home  Procedures:   No admission procedures for hospital encounter.   Antimicrobials:  Anti-infectives (From admission, onward)    Start     Dose/Rate Route Frequency Ordered Stop   09/25/23 1000  remdesivir 100 mg in sodium chloride  0.9 % 100 mL IVPB  Status:  Discontinued       Placed in "Followed by" Linked Group   100 mg 200 mL/hr over 30 Minutes Intravenous Daily 09/24/23 2034 09/24/23 2314   09/25/23 0000  nirmatrelvir/ritonavir (PAXLOVID) 3 tablet        3 tablet Oral 2 times daily 09/24/23 2314 09/29/23 2159   09/24/23 2100  remdesivir 200 mg in sodium chloride 0.9% 250 mL IVPB  Status:  Discontinued       Placed in  "Followed by" Linked Group   200 mg 580 mL/hr over 30 Minutes Intravenous Once 09/24/23 2034 09/24/23 2314        Medication:   amLODipine  2.5 mg Oral Daily   ascorbic acid  500 mg Oral Daily   enoxaparin (LOVENOX) injection  135 mg Subcutaneous Q24H   folic acid  1 mg Oral Daily   isosorbide mononitrate  30 mg Oral Daily   latanoprost  1 drop Both Eyes QHS   levalbuterol  0.63 mg Nebulization Q6H WA   methylPREDNISolone (SOLU-MEDROL) injection  0.5 mg/kg Intravenous Q12H   Followed by   Melene Muller ON 09/27/2023] predniSONE  50 mg Oral Daily   multivitamin with minerals  1 tablet Oral Daily   nirmatrelvir/ritonavir  3 tablet Oral BID   thiamine  100 mg Oral Daily   zinc sulfate (50mg  elemental zinc)  220 mg Oral Daily    acetaminophen, guaiFENesin-dextromethorphan, meclizine, melatonin, nitroGLYCERIN, polyethylene glycol, prochlorperazine   Objective:   Vitals:   09/25/23 0225 09/25/23 0602 09/25/23 0634 09/25/23 0733  BP:  121/84 102/60   Pulse:  71 73   Resp:  20 20   Temp:  97.6 F (36.4 C) 98.3 F (36.8 C)   TempSrc:  Oral    SpO2: 95% 97% 94% 98%  Weight:   90.4 kg   Height:        Intake/Output Summary (Last 24 hours) at 09/25/2023 1123 Last data filed at 09/25/2023 0900 Gross per 24 hour  Intake 360 ml  Output 300 ml  Net 60 ml   Filed Weights   09/24/23 1358 09/25/23 0634  Weight: 92 kg 90.4 kg     Physical examination:   Constitution:  Alert, cooperative, mild distress complaining of shortness of breath and cough addressed  appears calm  Psychiatric:   Normal and stable mood and affect, cognition intact,   HEENT:        Normocephalic, PERRL, otherwise with in Normal limits  Chest:         Chest symmetric Cardio vascular: Irregular, S1/S2, RRR, No murmure, No Rubs or Gallops  pulmonary: Diffuse wheezing, or rhonchi, no crackles Abdomen: Soft, non-tender, non-distended, bowel sounds,no masses, no organomegaly Muscular skeletal: Limited exam - in  bed, able to move all 4 extremities,   Neuro: CNII-XII intact. , normal motor and sensation, reflexes intact  Extremities: No pitting edema lower extremities, +2 pulses  Skin: Dry, warm to touch, negative for any Rashes, No open wounds Wounds: per nursing documentation   ------------------------------------------------------------------------------------------------------------------------------------------    LABs:     Latest Ref Rng & Units 09/24/2023    3:40 PM 05/30/2023    8:58 AM 03/12/2023    1:29 PM  CBC  WBC 4.0 - 10.5 K/uL 10.3  6.3  12.1   Hemoglobin 13.0 - 17.0 g/dL 16.1  09.6  04.5   Hematocrit 39.0 - 52.0 % 47.2  45.2  46.4   Platelets 150 - 400 K/uL 188  199.0  264       Latest Ref Rng & Units 09/24/2023    3:40 PM 05/30/2023    8:58 AM 03/12/2023    1:29 PM  CMP  Glucose 70 - 99 mg/dL 914  782  956   BUN 8 - 23 mg/dL 12  14  16    Creatinine 0.61 - 1.24 mg/dL 2.13  0.86  5.78   Sodium 135 - 145 mmol/L 137  138  138   Potassium 3.5 - 5.1 mmol/L 3.9  3.9  3.8   Chloride 98 - 111 mmol/L 103  105  106   CO2 22 - 32 mmol/L 24  27  22    Calcium 8.9 - 10.3 mg/dL 9.9  46.9  9.7   Total Protein 6.5 - 8.1 g/dL 7.4  6.9    Total Bilirubin <1.2 mg/dL 1.4  0.7    Alkaline Phos 38 - 126 U/L 59  62    AST 15 - 41 U/L 21  20    ALT 0 - 44 U/L 28  21         Micro Results Recent Results (from the past 240 hour(s))  SARS Coronavirus 2 by RT PCR (hospital order, performed in [facility] hospital lab) *cepheid single result test* Anterior Nasal Swab     Status: Abnormal   Collection Time: 09/24/23  5:50 PM   Specimen: Anterior Nasal Swab  Result Value Ref Range Status   SARS Coronavirus 2 by RT PCR POSITIVE (A) NEGATIVE Final    Comment: (NOTE) SARS-CoV-2 target nucleic acids are DETECTED  SARS-CoV-2 RNA is generally detectable in upper respiratory specimens  during the acute phase of infection.  Positive results are indicative  of the presence of the identified virus,  but do not rule out bacterial infection or co-infection with other pathogens not detected by the test.  Clinical correlation with patient history and  other diagnostic information is necessary to determine patient infection status.  The expected result is negative.  Fact Sheet for Patients:   RoadLapTop.co.za   Fact Sheet for Healthcare Providers:   http://kim-miller.com/    This test is not yet approved or cleared by the [place] FDA and  has been authorized for detection and/or diagnosis of SARS-CoV-2 by FDA under an Emergency Use Authorization (EUA).  This EUA will remain in effect (meaning this test can be used) for the duration of  the COVID-19 declaration under Section 564(b)(1)  of the Act, 21 U.S.C. section 360-bbb-3(b)(1), unless the authorization is terminated or revoked sooner.   Performed at [facility], [address] [place], [place] [address]   MRSA Next Gen by PCR, Nasal     Status: None   Collection Time: 09/25/23  3:32 AM   Specimen: Nasal Mucosa; Nasal Swab  Result Value Ref Range Status   MRSA by PCR Next Gen NOT DETECTED NOT DETECTED Final    Comment: (NOTE) The GeneXpert MRSA Assay (FDA approved for NASAL specimens only), is one component of a comprehensive MRSA colonization surveillance program. It is not intended to diagnose MRSA infection nor to guide or monitor treatment for MRSA infections. Test performance is not FDA approved in patients less than 68 years old. Performed at [facility], [address], [place], [place] [address]     Radiology Reports CT ABDOMEN PELVIS W CONTRAST  Result Date: 09/24/2023 CLINICAL DATA:  Nausea and vomiting with recent fall, initial encounter EXAM: CT ABDOMEN AND PELVIS WITH CONTRAST TECHNIQUE: Multidetector CT imaging  of the abdomen and pelvis was performed using the standard protocol following bolus administration of intravenous contrast. RADIATION DOSE REDUCTION:  This exam was performed according to the departmental dose-optimization program which includes automated exposure control, adjustment of the mA and/or kV according to patient size and/or use of iterative reconstruction technique. CONTRAST:  75mL OMNIPAQUE IOHEXOL 350 MG/ML SOLN COMPARISON:  10/31/2021 FINDINGS: Lower chest: No acute abnormality. Hepatobiliary: Gallbladder is well distended. Dependent small gallstones are seen. No complicating factors are noted. The liver is within normal limits. Pancreas: Pancreas again demonstrates fatty infiltration. Fluid attenuation cyst is noted anteriorly adjacent to the head of the pancreas similar to that seen on the prior exam. Additionally a new 12 mm pancreatic cyst is identified slightly increased from the prior exam at which time it measured less than 5 mm. Spleen: Normal in size without focal abnormality. Adrenals/Urinary Tract: Adrenal glands are within normal limits. Kidneys demonstrate a normal enhancement pattern bilaterally. Parapelvic cyst is noted on the left and cortical cyst on the right stable from the prior study. No further follow-up is recommended. No renal calculi or obstructive changes are seen. The bladder is well distended. Stomach/Bowel: Scattered diverticular change of the colon is noted without evidence of diverticulitis. The appendix is within normal limits. Small bowel and stomach are unremarkable. Vascular/Lymphatic: Aortic atherosclerosis. No enlarged abdominal or pelvic lymph nodes. Reproductive: Prostate is unremarkable. Other: No abdominal wall hernia or abnormality. No abdominopelvic ascites. Musculoskeletal: No acute or significant osseous findings. IMPRESSION: Cholelithiasis without complicating factors. Diffuse pancreatic atrophy with scattered cysts. Stable cyst in the pancreatic head are noted. A new 12 mm cyst is noted in the midportion of the body posteriorly increased from the prior exam. Given the patient's age no further follow-up  is recommended. The cysts have waxed and waned over multiple prior exams. No definitive solid mass is seen. Diverticulosis without diverticulitis. Electronically Signed   By: Alcide Clever M.D.   On: 09/24/2023 19:26   CT Head Wo Contrast  Result Date: 09/24/2023 CLINICAL DATA:  Dizziness.  Nausea and vomiting.  Fall. EXAM: CT HEAD WITHOUT CONTRAST TECHNIQUE: Contiguous axial images were obtained from the base of the skull through the vertex without intravenous contrast. RADIATION DOSE REDUCTION: This exam was performed according to the departmental dose-optimization program which includes automated exposure control, adjustment of the mA and/or kV according to patient size and/or use of iterative reconstruction technique. COMPARISON:  Head CT 03/11/2023 and MRI 03/12/2023 FINDINGS: Brain: There is no evidence of an acute infarct, intracranial hemorrhage, mass, midline shift, or extra-axial fluid collection. Moderate cerebral atrophy is unchanged. A partially empty sella is again noted. Vascular: Calcified atherosclerosis at the skull base. No hyperdense vessel. Skull: No acute fracture or suspicious osseous lesion. Sinuses/Orbits: Mild mucosal thickening in the included paranasal sinuses. Clear mastoid air cells. Bilateral cataract extraction. Other: None. IMPRESSION: No evidence of acute intracranial abnormality. Electronically Signed   By: Sebastian Ache M.D.   On: 09/24/2023 16:48    SIGNED: Kendell Bane, MD, FHM. FAAFP. [facility] - [facility] Time spent - 55 min.  In seeing, evaluating and examining the patient. Reviewing medical records, labs, drawn plan of care. [facility],  Pager (please use [email or web address] to page/ text) Please use Epic Secure Chat for non-urgent communication (7AM-7PM)  If 7PM-7AM, please contact night-coverage [email or web address], 09/25/2023, 11:23 AM

## 2023-09-25 NOTE — Plan of Care (Signed)

## 2023-09-25 NOTE — Progress Notes (Signed)
   09/25/23 1355  TOC Brief Assessment  Insurance and Status Reviewed  Patient has primary care physician Yes  Home environment has been reviewed Home with spouse  Prior level of function: Independent  Prior/Current Home Services No current home services  Social Determinants of Health Reivew SDOH reviewed no interventions necessary  Readmission risk has been reviewed Yes  Transition of care needs no transition of care needs at this time    [facility] will continue to follow for any pending recommendation from PT.
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
## 2023-09-25 NOTE — Hospital Course (Signed)
Bryan Cabrera is a 82 y.o. male with medical history significant for peripheral vertigo, hyperlipidemia, paroxysmal A-fib on Eliquis, TIA, coronary disease, GERD, who presented with intractable nausea and vomiting that began earlier today.  Associated with generalized weakness causing a fall for which EMS was activated.  Admits to sick contacts and wants to be tested for COVID 19 viral infection.  He was brought into the ED for further evaluation.    ED Course: Temperature 97.6.  BP 121/84, pulse 71, respiratory 20, saturation 97% on 2 L.  Lab studies notable for elevated serum glucose 147.  T. bili 1.4. Was hypoxic with O2 saturation of 89% on room air, improved on 2 L to 95%.   COVID-19 viral screening test positive.   The patient was started on antivirals, admitted by [facility], hospitalist service.      Assessment/Plan:   Present on Admission:  COVID-19 virus infection      COVID-19 viral infection, POA -With acute respiratory failure, requiring oxygen -Symptomatic with shortness of breath, cough, intractable nausea vomiting -Initiated on oral treatment with Paxlovid -IV steroids, bronchodilators Antitussives as needed   Acute hypoxic respiratory failure secondary to SARS-CoV-2 Not on O2 supplementation at baseline Currently requiring 2 L to maintain saturation above 92%. Incentive spirometer IV Solu-Medrol, bronchodilators Wean off oxygen supplementation as tolerated Home O2 evaluation on 09/25/2023.   Paroxysmal A-fib on Eliquis - home Eliquis-will be switched to subcu Lovenox as Eliquis interacts with Paxlovid Monitor on telemetry. -    Hyperlipidemia Holding home medication of statin/Crestor-due to increase of rhabdomyolysis with Paxlovid   Chronic HFpEF Euvolemic on exam Resume home regimen Strict I's and O's and daily weight   Obesity BMI 30 Recommend weight loss outpatient with regular physical activity and healthy dieting.

## 2023-09-26 DIAGNOSIS — U071 COVID-19: Secondary | ICD-10-CM | POA: Diagnosis not present

## 2023-09-26 MED ORDER — LEVALBUTEROL HCL 0.63 MG/3ML IN NEBU
0.6300 mg | INHALATION_SOLUTION | Freq: Two times a day (BID) | RESPIRATORY_TRACT | Status: DC
  Administered 2023-09-27: 0.63 mg via RESPIRATORY_TRACT
  Filled 2023-09-26: qty 3

## 2023-09-26 NOTE — Plan of Care (Signed)

## 2023-09-26 NOTE — Plan of Care (Signed)

## 2023-09-26 NOTE — Evaluation (Addendum)
Physical Therapy Evaluation Patient Details Name: Bryan Cabrera MRN: [id number] DOB: July 13, 1941 Today's Date: 09/26/2023  History of Present Illness  a 82 y.o. male with medical history significant for peripheral vertigo, hyperlipidemia, paroxysmal A-fib on Eliquis, TIA, coronary disease, GERD, who presented with intractable nausea and vomiting that began earlier today.  Associated with generalized weakness causing a fall for which EMS was activated.  Admits to sick contacts and wants to be tested for COVID 19 viral infection.  He was brought into the ED for further evaluation  Clinical Impression   Pt tolerated today's Physical Therapy Evaluation, with decent return for OOB mobility. Pt with slow, labored movement and use of supportive structures during transfers and benefits from AD during ambulation. At baseline, pt is independent with mobility and ADLs, working as Printmaker, drives. Currently, pt demonstrating decreased functional capacity with transfers, ambulation and ADLs due to muscle weakness, deconditioning and balance deficits during ambulation. Pt watches after wife who is somewhat independent. BP as follows: Sitting 136/6, standing: 146/60, HR in 100's throughout mobility. SpO2 remained stable throughout mobility on room air. Based upon these deficits/impairments, patient will benefit from continued skilled physical therapy services during remainder of hospital stay and at the next recommended venue of care to address deficits and promote return to optimal function.                If plan is discharge home, recommend the following: A little help with walking and/or transfers;A little help with bathing/dressing/bathroom   Can travel by private vehicle        Equipment Recommendations None recommended by PT  Recommendations for Other Services       Functional Status Assessment Patient has had a recent decline in their functional status and demonstrates the ability to make  significant improvements in function in a reasonable and predictable amount of time.     Precautions / Restrictions Precautions Precautions: Fall Precaution Comments: contact and airborne Restrictions Weight Bearing Restrictions: No      Mobility  Bed Mobility Overal bed mobility: Modified Independent             General bed mobility comments: with HOB elevated, labored and slow movements. Patient Response: Cooperative  Transfers Overall transfer level: Needs assistance Equipment used: None Transfers: Sit to/from Stand Sit to Stand: Modified independent (Device/Increase time), Supervision           General transfer comment: powerup to stand from EOB with use of bedrail.    Ambulation/Gait Ambulation/Gait assistance: Supervision, Contact guard assist, Modified independent (Device/Increase time) Gait Distance (Feet): 40 Feet Assistive device: Rolling walker (2 wheels) Gait Pattern/deviations: WFL(Within Functional Limits), Step-through pattern, Decreased step length - right, Decreased step length - left, Narrow base of support, Decreased weight shift to left, Decreased weight shift to right       General Gait Details: Initial 53ft in room with single HHA from therapist, increased unsteady with reaching for wall with free UE. CGA, given RW for last 72ft and ambulating @ modified independence with increased steadiness.  Stairs            Wheelchair Mobility     Tilt Bed Tilt Bed Patient Response: Cooperative  Modified Rankin (Stroke Patients Only)       Balance Overall balance assessment: Needs assistance Sitting-balance support: No upper extremity supported Sitting balance-Leahy Scale: Normal Sitting balance - Comments: seated EOB and recliner   Standing balance support: No upper extremity supported, During functional activity Standing balance-Leahy Scale: Fair Standing  balance comment: fair standing balance with steps without AD; good with RW.                              Pertinent Vitals/Pain Pain Assessment Pain Assessment: No/denies pain    Home Living Family/patient expects to be discharged to:: Private residence Living Arrangements: Spouse/significant other Available Help at Discharge: Family Type of Home: House Home Access: Ramped entrance     Alternate Level Stairs-Number of Steps: ranch house with basement. Home Layout: Two level Home Equipment: Cane - single point;Tub bench      Prior Function               Mobility Comments: Works fulltime as Soil scientist, community ambulation with SPC, drives ADLs Comments: Independent     Extremity/Trunk Assessment   Upper Extremity Assessment Upper Extremity Assessment: Defer to OT evaluation    Lower Extremity Assessment Lower Extremity Assessment: Generalized weakness;RLE deficits/detail;LLE deficits/detail RLE Deficits / Details: 4-/5 knee extension RLE Sensation: WNL LLE Deficits / Details: 4-/5 knee extension LLE Sensation: WNL    Cervical / Trunk Assessment Cervical / Trunk Assessment: Kyphotic  Communication   Communication Communication: No apparent difficulties  Cognition Arousal: Alert Behavior During Therapy: WFL for tasks assessed/performed Overall Cognitive Status: Within Functional Limits for tasks assessed                                          General Comments      Exercises     Assessment/Plan    PT Assessment Patient needs continued PT services  PT Problem List Decreased strength;Decreased activity tolerance;Decreased balance;Decreased mobility;Decreased coordination       PT Treatment Interventions DME instruction;Gait training;Functional mobility training;Therapeutic exercise;Therapeutic activities;Balance training;Neuromuscular re-education    PT Goals (Current goals can be found in the Care Plan section)  Acute Rehab PT Goals Patient Stated Goal: return home PT Goal Formulation: With  patient Time For Goal Achievement: 10/10/23 Potential to Achieve Goals: Good    Frequency Min 3X/week     Co-evaluation               AM-PAC PT "6 Clicks" Mobility  Outcome Measure Help needed turning from your back to your side while in a flat bed without using bedrails?: None Help needed moving from lying on your back to sitting on the side of a flat bed without using bedrails?: None Help needed moving to and from a bed to a chair (including a wheelchair)?: A Little Help needed standing up from a chair using your arms (e.g., wheelchair or bedside chair)?: A Little Help needed to walk in hospital room?: A Little Help needed climbing 3-5 steps with a railing? : A Lot 6 Click Score: 19    End of Session Equipment Utilized During Treatment: Gait belt Activity Tolerance: Patient tolerated treatment well Patient left: with call bell/phone within reach;in chair;with chair alarm set Nurse Communication: Mobility status PT Visit Diagnosis: Unsteadiness on feet (R26.81);Muscle weakness (generalized) (M62.81)    Time: 7846-9629 PT Time Calculation (min) (ACUTE ONLY): 22 min   Charges:   PT Evaluation $PT Eval Low Complexity: 1 Low   PT General Charges $$ ACUTE PT VISIT: 1 Visit         Elie Goody, DPT [facility] [place] [phone] office  Nelida Meuse 09/26/2023,  12:21 PM

## 2023-09-26 NOTE — TOC Initial Note (Signed)
Transition of Care [facility]) - Initial/Assessment Note    Patient Details  Name: Bryan Cabrera MRN: [id number] Date of Birth: 1941/07/13  Transition of Care [facility]) CM/SW Contact:    Villa Herb, LCSWA Phone Number: 09/26/2023, 12:39 PM  Clinical Narrative:                 CSW updated that PT is recommending HH PT for pt at D/C. CSW spoke with pt who is agreeable to this referral. Pt requests referral be sent to [facility] for review. CSW requested that MD place [facility] PT/OT orders. TOC to follow.   Expected Discharge Plan: Home w Home Health Services Barriers to Discharge: Continued Medical Work up   Patient Goals and CMS Choice Patient states their goals for this hospitalization and ongoing recovery are:: return home CMS Medicare.gov Compare Post Acute Care list provided to:: Patient Choice offered to / list presented to : Patient      Expected Discharge Plan and Services In-house Referral: Clinical Social Work Discharge Planning Services: CM Consult Post Acute Care Choice: Home Health Living arrangements for the past 2 months: Single Family Home                                      Prior Living Arrangements/Services Living arrangements for the past 2 months: Single Family Home Lives with:: Spouse Patient language and need for interpreter reviewed:: Yes Do you feel safe going back to the place where you live?: Yes      Need for Family Participation in Patient Care: Yes (Comment) Care giver support system in place?: Yes (comment)   Criminal Activity/Legal Involvement Pertinent to Current Situation/Hospitalization: No - Comment as needed  Activities of Daily Living   ADL Screening (condition at time of admission) Independently performs ADLs?: Yes (appropriate for developmental age) Is the patient deaf or have difficulty hearing?: No Does the patient have difficulty seeing, even when wearing glasses/contacts?: No Does the patient have difficulty concentrating,  remembering, or making decisions?: No  Permission Sought/Granted                  Emotional Assessment Appearance:: Appears stated age Attitude/Demeanor/Rapport: Engaged Affect (typically observed): Accepting Orientation: : Oriented to Self, Oriented to Place, Oriented to  Time, Oriented to Situation Alcohol / Substance Use: Not Applicable Psych Involvement: No (comment)  Admission diagnosis:  Weakness [R53.1] COVID [U07.1] COVID-19 virus infection [U07.1] Patient Active Problem List   Diagnosis Date Noted   Coronary artery disease 06/13/2022   Symptomatic bradycardia 06/12/2022   Lower extremity edema 04/24/2022   TIA (transient ischemic attack) 12/19/2021   Hyperlipidemia 12/14/2021   Persistent atrial fibrillation (HCC) 12/14/2021   Angina, class IV (HCC) 12/08/2021   Atypical chest pain 11/24/2021   Hypertensive urgency 11/24/2021   COVID-19 virus infection 09/20/2021   Hypokalemia 09/20/2021   Open-angle glaucoma 09/20/2021   Dizziness 09/20/2021   Gait instability 08/18/2021   Syncope 02/08/2018   Pancreatic cyst 05/01/2016   Gastroesophageal reflux disease without esophagitis 02/10/2016   Mucosal abnormality of stomach    Reflux esophagitis    Epistaxis, recurrent    History of colonic polyps    Diverticulosis of colon without hemorrhage    Nasal vestibulitis 12/15/2015   Dysfunction of both eustachian tubes 12/15/2015   Nausea & vomiting 05/12/2015   Pneumococcal vaccination declined by patient 12/21/2014   Hyperglycemia 11/12/2014   Skin abnormalities 06/25/2012  Neoplasm of uncertain behavior of skin 06/25/2012   Back pain 02/04/2012   Screening for colon cancer 02/04/2012   Medicare annual wellness visit, subsequent 02/04/2012   Hernia 02/04/2012   Male hypogonadism 09/13/2011   Hemorrhoids, external 02/17/2011   Erectile dysfunction 02/17/2011   Cataract 02/17/2011   Recurrent herpes labialis 02/17/2011   Hearing decreased 02/17/2011    Rhinitis, chronic 02/12/2009   HYPERTRIGLYCERIDEMIA 12/14/2008   HYPERGLYCEMIA, BORDERLINE 08/19/2007   Osteoarthritis 08/14/2007   PCP:  [name], MD Pharmacy:   [facility] [place] [place] - [address] [place] [address] Phone: [phone] Fax: [phone]     Social Determinants of Health (SDOH) Social History: SDOH Screenings   Food Insecurity: No Food Insecurity (09/24/2023)  Housing: Low Risk  (09/24/2023)  Transportation Needs: No Transportation Needs (09/24/2023)  Utilities: Not At Risk (09/24/2023)  Alcohol Screen: Low Risk  (11/29/2022)  Depression (PHQ2-9): Low Risk  (06/06/2023)  Financial Resource Strain: Low Risk  (11/29/2022)  Physical Activity: Inactive (11/29/2022)  Social Connections: Moderately Isolated (11/29/2022)  Stress: No Stress Concern Present (11/29/2022)  Tobacco Use: Medium Risk (09/24/2023)   SDOH Interventions:     Readmission Risk Interventions     No data to display

## 2023-09-26 NOTE — Progress Notes (Signed)
PROGRESS NOTE    Patient: Bryan Cabrera                            PCP: [name], MD                    DOB: July 17, 1941            DOA: 09/24/2023 JYN:[id number]             DOS: 09/26/2023, 10:15 AM   LOS: 2 days   Date of Service: The patient was seen and examined on 09/26/2023  Subjective:   The patient was seen and examined this morning, still complain shortness of breath, coughing, Chronic 2 L of oxygen, satting 96%  Confirmed that he is not O2 dependent at home.   Brief Narrative:   Bryan Cabrera is a 82 y.o. male with medical history significant for peripheral vertigo, hyperlipidemia, paroxysmal A-fib on Eliquis, TIA, coronary disease, GERD, who presented with intractable nausea and vomiting that began earlier today.  Associated with generalized weakness causing a fall for which EMS was activated.  Admits to sick contacts and wants to be tested for COVID 19 viral infection.  He was brought into the ED for further evaluation.    ED Course: Temperature 97.6.  BP 121/84, pulse 71, respiratory 20, saturation 97% on 2 L.  Lab studies notable for elevated serum glucose 147.  T. bili 1.4. Was hypoxic with O2 saturation of 89% on room air, improved on 2 L to 95%.   COVID-19 viral screening test positive.   The patient was started on antivirals, admitted by [facility], hospitalist service.      Assessment/Plan:   Present on Admission:  COVID-19 virus infection      COVID-19 viral infection, POA -Here to have symptoms, shortness of breath, cough, nausea vomiting has improved -With acute respiratory failure, requiring oxygen -Running up to 2 L of oxygen, currently satting 96% -Nonproductive dry cough  -Initiated on oral treatment with Paxlovid -IV steroids, bronchodilators Antitussives as needed   Acute hypoxic respiratory failure secondary to SARS-CoV-2 Not on O2 supplementation at baseline Currently requiring 2 L satting 96% Incentive spirometer IV Solu-Medrol,  bronchodilators Wean off oxygen supplementation as tolerated Home O2 evaluation on 09/25/2023.   Paroxysmal A-fib on Eliquis - Holding Home Eliquis-will be switched to subcu Lovenox  as Eliquis interacts with Paxlovid Monitor on telemetry.   Hyperlipidemia Holding home medication of statin/Crestor-due to increase of rhabdomyolysis with Paxlovid   Chronic HFpEF Euvolemic on exam Resume home regimen Strict I's and O's and daily weight   Obesity BMI 30 Recommend weight loss outpatient with regular physical activity and healthy dieting.      ------------------------------------------------------------------------------------------------------------------------------------------- Nutritional status:  The patient's BMI is: Body mass index is 30.57 kg/m. I agree with the assessment and plan as outlined -------------------------------------------------------------------------------------------------------------------------------------------  DVT prophylaxis:  On Eliquis switching to Lovenox   Code Status:   Code Status: Full Code  Family Communication: No family member present at bedside-  -Advance care planning has been discussed.   Admission status:   Status is: Inpatient Remains inpatient appropriate because: Needing respiratory support, COVID treatment, IV steroids,   Disposition: From  - home             Plan to discharge home once weaned off supplemental oxygen, improved symptoms   In next 24-48 hours  Procedures:   No admission procedures for hospital encounter.  Antimicrobials:  Anti-infectives (From admission, onward)    Start     Dose/Rate Route Frequency Ordered Stop   09/25/23 1000  remdesivir 100 mg in sodium chloride 0.9 % 100 mL IVPB  Status:  Discontinued       Placed in "Followed by" Linked Group   100 mg 200 mL/hr over 30 Minutes Intravenous Daily 09/24/23 2034 09/24/23 2314   09/25/23 0000  nirmatrelvir/ritonavir (PAXLOVID) 3 tablet        3  tablet Oral 2 times daily 09/24/23 2314 09/29/23 2159   09/24/23 2100  remdesivir 200 mg in sodium chloride 0.9% 250 mL IVPB  Status:  Discontinued       Placed in "Followed by" Linked Group   200 mg 580 mL/hr over 30 Minutes Intravenous Once 09/24/23 2034 09/24/23 2314        Medication:   amLODipine  2.5 mg Oral Daily   ascorbic acid  500 mg Oral Daily   enoxaparin (LOVENOX) injection  135 mg Subcutaneous Q24H   folic acid  1 mg Oral Daily   isosorbide mononitrate  30 mg Oral Daily   latanoprost  1 drop Both Eyes QHS   levalbuterol  0.63 mg Nebulization Q6H WA   methylPREDNISolone (SOLU-MEDROL) injection  0.5 mg/kg Intravenous Q12H   Followed by   Melene Muller ON 09/27/2023] predniSONE  50 mg Oral Daily   multivitamin with minerals  1 tablet Oral Daily   nirmatrelvir/ritonavir  3 tablet Oral BID   thiamine  100 mg Oral Daily   zinc sulfate (50mg  elemental zinc)  220 mg Oral Daily    acetaminophen, guaiFENesin-dextromethorphan, meclizine, melatonin, nitroGLYCERIN, polyethylene glycol, prochlorperazine   Objective:   Vitals:   09/25/23 2210 09/26/23 0547 09/26/23 0813 09/26/23 0942  BP: 121/76 (!) 144/97    Pulse: (!) 101 93    Resp: 20 18    Temp: 97.6 F (36.4 C) 98.1 F (36.7 C)    TempSrc: Oral Oral    SpO2: 97% 93% 94% 96%  Weight:  91.2 kg    Height:        Intake/Output Summary (Last 24 hours) at 09/26/2023 1015 Last data filed at 09/26/2023 0946 Gross per 24 hour  Intake 820 ml  Output 400 ml  Net 420 ml   Filed Weights   09/24/23 1358 09/25/23 0634 09/26/23 0547  Weight: 92 kg 90.4 kg 91.2 kg     Physical examination:   General:  AAO x 3,  cooperative, no distress;   HEENT:  Normocephalic, PERRL, otherwise with in Normal limits   Neuro:  CNII-XII intact. , normal motor and sensation, reflexes intact   Lungs:   Diffusely wheezing, rhonchi, negative for any crackles Positive breath sounds in all lung fields  Cardio:    S1/S2, RRR, No murmure, No Rubs  or Gallops   Abdomen:  Soft, non-tender, bowel sounds active all four quadrants, no guarding or peritoneal signs.  Muscular  skeletal:  Limited exam -global generalized weaknesses - in bed, able to move all 4 extremities,   2+ pulses,  symmetric, No pitting edema  Skin:  Dry, warm to touch, negative for any Rashes,  Wounds: Please see nursing documentation          ------------------------------------------------------------------------------------------------------------------------------------------    LABs:     Latest Ref Rng & Units 09/24/2023    3:40 PM 05/30/2023    8:58 AM 03/12/2023    1:29 PM  CBC  WBC 4.0 - 10.5 K/uL 10.3  6.3  12.1  Hemoglobin 13.0 - 17.0 g/dL 16.1  09.6  04.5   Hematocrit 39.0 - 52.0 % 47.2  45.2  46.4   Platelets 150 - 400 K/uL 188  199.0  264       Latest Ref Rng & Units 09/24/2023    3:40 PM 05/30/2023    8:58 AM 03/12/2023    1:29 PM  CMP  Glucose 70 - 99 mg/dL 409  811  914   BUN 8 - 23 mg/dL 12  14  16    Creatinine 0.61 - 1.24 mg/dL 7.82  9.56  2.13   Sodium 135 - 145 mmol/L 137  138  138   Potassium 3.5 - 5.1 mmol/L 3.9  3.9  3.8   Chloride 98 - 111 mmol/L 103  105  106   CO2 22 - 32 mmol/L 24  27  22    Calcium 8.9 - 10.3 mg/dL 9.9  08.6  9.7   Total Protein 6.5 - 8.1 g/dL 7.4  6.9    Total Bilirubin <1.2 mg/dL 1.4  0.7    Alkaline Phos 38 - 126 U/L 59  62    AST 15 - 41 U/L 21  20    ALT 0 - 44 U/L 28  21         Micro Results Recent Results (from the past 240 hour(s))  SARS Coronavirus 2 by RT PCR (hospital order, performed in [facility] hospital lab) *cepheid single result test* Anterior Nasal Swab     Status: Abnormal   Collection Time: 09/24/23  5:50 PM   Specimen: Anterior Nasal Swab  Result Value Ref Range Status   SARS Coronavirus 2 by RT PCR POSITIVE (A) NEGATIVE Final    Comment: (NOTE) SARS-CoV-2 target nucleic acids are DETECTED  SARS-CoV-2 RNA is generally detectable in upper respiratory specimens  during  the acute phase of infection.  Positive results are indicative  of the presence of the identified virus, but do not rule out bacterial infection or co-infection with other pathogens not detected by the test.  Clinical correlation with patient history and  other diagnostic information is necessary to determine patient infection status.  The expected result is negative.  Fact Sheet for Patients:   RoadLapTop.co.za   Fact Sheet for Healthcare Providers:   http://kim-miller.com/    This test is not yet approved or cleared by the [place] FDA and  has been authorized for detection and/or diagnosis of SARS-CoV-2 by FDA under an Emergency Use Authorization (EUA).  This EUA will remain in effect (meaning this test can be used) for the duration of  the COVID-19 declaration under Section 564(b)(1)  of the Act, 21 U.S.C. section 360-bbb-3(b)(1), unless the authorization is terminated or revoked sooner.   Performed at [facility], [address], [place], [place] [address]   MRSA Next Gen by PCR, Nasal     Status: None   Collection Time: 09/25/23  3:32 AM   Specimen: Nasal Mucosa; Nasal Swab  Result Value Ref Range Status   MRSA by PCR Next Gen NOT DETECTED NOT DETECTED Final    Comment: (NOTE) The GeneXpert MRSA Assay (FDA approved for NASAL specimens only), is one component of a comprehensive MRSA colonization surveillance program. It is not intended to diagnose MRSA infection nor to guide or monitor treatment for MRSA infections. Test performance is not FDA approved in patients less than 80 years old. Performed at [facility], [address] [place], [place] [address]     Radiology Reports  No results found.  SIGNED: Kendell Bane, MD, FHM. FAAFP. [facility] - [facility] hospitalist Time spent - 55 min.  In seeing, evaluating and examining the patient. Reviewing medical records, labs, drawn plan of care. [facility],  Pager  (please use [email or web address] to page/ text) Please use Epic Secure Chat for non-urgent communication (7AM-7PM)  If 7PM-7AM, please contact night-coverage [email or web address], 09/26/2023, 10:15 AM

## 2023-09-26 NOTE — Plan of Care (Signed)
  Problem: Acute Rehab PT Goals(only PT should resolve) Goal: Pt Will Go Supine/Side To Sit Flowsheets (Taken 09/26/2023 1225) Pt will go Supine/Side to Sit: Independently Goal: Patient Will Transfer Sit To/From Stand Flowsheets (Taken 09/26/2023 1225) Patient will transfer sit to/from stand: Independently Goal: Pt Will Ambulate Flowsheets (Taken 09/26/2023 1225) Pt will Ambulate:  75 feet  with least restrictive assistive device  with modified independence  Nelida Meuse PT, DPT [facility] [place] [phone] office

## 2023-09-27 DIAGNOSIS — U071 COVID-19: Secondary | ICD-10-CM | POA: Diagnosis not present

## 2023-09-27 MED ORDER — LEVALBUTEROL HCL 0.63 MG/3ML IN NEBU: 0.6300 mg | INHALATION_SOLUTION | Freq: Four times a day (QID) | RESPIRATORY_TRACT | Status: DC | PRN

## 2023-09-27 NOTE — Progress Notes (Signed)
PROGRESS NOTE    Patient: Bryan Cabrera                            PCP: [name], MD                    DOB: 1941-10-15            DOA: 09/24/2023 ZOX:[id number]             DOS: 09/27/2023, 6:46 PM   LOS: 3 days   Date of Service: The patient was seen and examined on 09/27/2023  Subjective:   -Cough and dyspnea improving -Ambulated around in the room with mobility specialist -Weaning off oxygen nicely -Complains of fatigue and malaise, generalized weakness  Brief Narrative:   Bryan Cabrera is a 82 y.o. male with medical history significant for peripheral vertigo, hyperlipidemia, paroxysmal A-fib on Eliquis, TIA, coronary disease, GERD, who presented with intractable nausea and vomiting that began earlier today.  Associated with generalized weakness causing a fall for which EMS was activated.  Admits to sick contacts and wants to be tested for COVID 19 viral infection.  He was brought into the ED for further evaluation.    ED Course: Temperature 97.6.  BP 121/84, pulse 71, respiratory 20, saturation 97% on 2 L.  Lab studies notable for elevated serum glucose 147.  T. bili 1.4. Was hypoxic with O2 saturation of 89% on room air, improved on 2 L to 95%.   COVID-19 viral screening test positive.   The patient was started on antivirals, admitted by [facility], hospitalist service.      Assessment/Plan:   Present on Admission:  COVID-19 virus infection      COVID-19 viral infection, POA -Aspiratory symptoms and hypoxia improving with steroids and bronchodilators -Initiated on oral treatment with Paxlovid -IV steroids, bronchodilators Antitussives as needed   Acute hypoxic respiratory failure secondary to SARS-CoV-2 Not on O2 supplementation at baseline -Weaning off oxygen nicely   Paroxysmal A-fib on Eliquis - Holding Home Eliquis while on Paxlovid-will be switched to subcu Lovenox  Monitor on telemetry.  Hyperlipidemia Holding home medication of statin/Crestor-due  to increase of rhabdomyolysis with Paxlovid   Chronic HFpEF Euvolemic on exam Resume home regimen Strict I's and O's and daily weight   Obesity BMI 30 Recommend weight loss outpatient with regular physical activity and healthy dieting.     ------------------------------------------------------------------------------------------------------------------------------------------- Nutritional status:  The patient's BMI is: Body mass index is 30.64 kg/m. I agree with the assessment and plan as outlined -------------------------------------------------------------------------------------------------------------------------------------------  DVT prophylaxis:  On Eliquis switching to Lovenox   Code Status:   Code Status: Full Code  Family Communication: No family member present at bedside-   Admission status:   Status is: Inpatient  Disposition: From  - home in about 24 to 48 hours if continues to improve             Procedures:  Antimicrobials:  Anti-infectives (From admission, onward)    Start     Dose/Rate Route Frequency Ordered Stop   09/25/23 1000  remdesivir 100 mg in sodium chloride 0.9 % 100 mL IVPB  Status:  Discontinued       Placed in "Followed by" Linked Group   100 mg 200 mL/hr over 30 Minutes Intravenous Daily 09/24/23 2034 09/24/23 2314   09/25/23 0000  nirmatrelvir/ritonavir (PAXLOVID) 3 tablet        3 tablet Oral 2 times daily 09/24/23  2314 09/29/23 2159   09/24/23 2100  remdesivir 200 mg in sodium chloride 0.9% 250 mL IVPB  Status:  Discontinued       Placed in "Followed by" Linked Group   200 mg 580 mL/hr over 30 Minutes Intravenous Once 09/24/23 2034 09/24/23 2314      Medication:   amLODipine  2.5 mg Oral Daily   ascorbic acid  500 mg Oral Daily   enoxaparin (LOVENOX) injection  135 mg Subcutaneous Q24H   folic acid  1 mg Oral Daily   isosorbide mononitrate  30 mg Oral Daily   latanoprost  1 drop Both Eyes QHS   levalbuterol  0.63 mg  Nebulization BID   multivitamin with minerals  1 tablet Oral Daily   nirmatrelvir/ritonavir  3 tablet Oral BID   predniSONE  50 mg Oral Daily   thiamine  100 mg Oral Daily   zinc sulfate (50mg  elemental zinc)  220 mg Oral Daily    acetaminophen, guaiFENesin-dextromethorphan, meclizine, melatonin, nitroGLYCERIN, polyethylene glycol, prochlorperazine   Objective:   Vitals:   09/26/23 2020 09/27/23 0401 09/27/23 0853 09/27/23 1351  BP:  (!) 144/85  121/68  Pulse:  79  78  Resp:  20  18  Temp:  98.2 F (36.8 C)    TempSrc:      SpO2: 96% 97% 96% 98%  Weight:  91.4 kg    Height:        Intake/Output Summary (Last 24 hours) at 09/27/2023 1846 Last data filed at 09/27/2023 1700 Gross per 24 hour  Intake 720 ml  Output 950 ml  Net -230 ml   Filed Weights   09/25/23 0634 09/26/23 0547 09/27/23 0401  Weight: 90.4 kg 91.2 kg 91.4 kg    Physical Exam Gen:- Awake Alert, in no acute distress  HEENT:- [place].AT, No sclera icterus Neck-Supple Neck,No JVD,.  Lungs-no wheezing, no rhonchi, fair air movement bilaterally  CV- S1, S2 normal, RRR Abd-  +ve B.Sounds, Abd Soft, No tenderness,    Extremity/Skin:- No  edema,   good pedal pulses  Psych-affect is appropriate, oriented x3 Neuro-generalized weakness, no new focal deficits, no tremors  LABs:     Latest Ref Rng & Units 09/24/2023    3:40 PM 05/30/2023    8:58 AM 03/12/2023    1:29 PM  CBC  WBC 4.0 - 10.5 K/uL 10.3  6.3  12.1   Hemoglobin 13.0 - 17.0 g/dL 57.8  46.9  62.9   Hematocrit 39.0 - 52.0 % 47.2  45.2  46.4   Platelets 150 - 400 K/uL 188  199.0  264       Latest Ref Rng & Units 09/24/2023    3:40 PM 05/30/2023    8:58 AM 03/12/2023    1:29 PM  CMP  Glucose 70 - 99 mg/dL 528  413  244   BUN 8 - 23 mg/dL 12  14  16    Creatinine 0.61 - 1.24 mg/dL 0.10  2.72  5.36   Sodium 135 - 145 mmol/L 137  138  138   Potassium 3.5 - 5.1 mmol/L 3.9  3.9  3.8   Chloride 98 - 111 mmol/L 103  105  106   CO2 22 - 32 mmol/L 24  27   22    Calcium 8.9 - 10.3 mg/dL 9.9  64.4  9.7   Total Protein 6.5 - 8.1 g/dL 7.4  6.9    Total Bilirubin <1.2 mg/dL 1.4  0.7    Alkaline Phos 38 -  126 U/L 59  62    AST 15 - 41 U/L 21  20    ALT 0 - 44 U/L 28  21     Micro Results Recent Results (from the past 240 hours)  SARS Coronavirus 2 by RT PCR (hospital order, performed in [facility] hospital lab) *cepheid single result test* Anterior Nasal Swab     Status: Abnormal   Collection Time: 09/24/23  5:50 PM   Specimen: Anterior Nasal Swab  Result Value Ref Range Status   SARS Coronavirus 2 by RT PCR POSITIVE (A) NEGATIVE Final    Comment: (NOTE) SARS-CoV-2 target nucleic acids are DETECTED  SARS-CoV-2 RNA is generally detectable in upper respiratory specimens  during the acute phase of infection.  Positive results are indicative  of the presence of the identified virus, but do not rule out bacterial infection or co-infection with other pathogens not detected by the test.  Clinical correlation with patient history and  other diagnostic information is necessary to determine patient infection status.  The expected result is negative.  Fact Sheet for Patients:   RoadLapTop.co.za   Fact Sheet for Healthcare Providers:   http://kim-miller.com/    This test is not yet approved or cleared by the [place] FDA and  has been authorized for detection and/or diagnosis of SARS-CoV-2 by FDA under an Emergency Use Authorization (EUA).  This EUA will remain in effect (meaning this test can be used) for the duration of  the COVID-19 declaration under Section 564(b)(1)  of the Act, 21 U.S.C. section 360-bbb-3(b)(1), unless the authorization is terminated or revoked sooner.   Performed at [facility], [address] [place], [place] [address]   MRSA Next Gen by PCR, Nasal     Status: None   Collection Time: 09/25/23  3:32 AM   Specimen: Nasal Mucosa; Nasal Swab  Result Value Ref Range  Status   MRSA by PCR Next Gen NOT DETECTED NOT DETECTED Final    Comment: (NOTE) The GeneXpert MRSA Assay (FDA approved for NASAL specimens only), is one component of a comprehensive MRSA colonization surveillance program. It is not intended to diagnose MRSA infection nor to guide or monitor treatment for MRSA infections. Test performance is not FDA approved in patients less than 84 years old. Performed at [facility], [address] [place], [place] [address]    SIGNED: Shon Hale, MD,    [facility]ist  If 7PM-7AM, please contact night-coverage [email or web address], 09/27/2023, 6:46 PM

## 2023-09-27 NOTE — Progress Notes (Addendum)
Mobility Specialist Progress Note:    09/27/23 1215  Mobility  Activity Ambulated with assistance in room;Transferred from bed to chair  Level of Assistance Standby assist, set-up cues, supervision of patient - no hands on  Assistive Device Front wheel walker  Distance Ambulated (ft) 70 ft  Range of Motion/Exercises Active;All extremities  Activity Response Tolerated well  Mobility Referral Yes  Mobility visit 1 Mobility  Mobility Specialist Start Time (ACUTE ONLY) 1145  Mobility Specialist Stop Time (ACUTE ONLY) 1205  Mobility Specialist Time Calculation (min) (ACUTE ONLY) 20 min   Pt received in bed, RN in room. Agreeable to mobility, required supervision to stand and ambulate with RW. Tolerated well, see O2 sats and BP below. Pt denies SOB. Left pt in chair, alarm on. Call bell in reach, all needs met.   SpO2 98% on RA at rest SpO2 94% on RA during ambulation SpO2 99% on RA after session  BP sitting EOB before session: 129/83 (95) BP sitting in chair after session: 148/80 (99)  Mallori Araque Mobility Specialist Please contact via Special educational needs teacher or  Rehab office at [phone]

## 2023-09-27 NOTE — Plan of Care (Signed)
  Problem: Education: Goal: Knowledge of General Education information will improve Description: Including pain rating scale, medication(s)/side effects and non-pharmacologic comfort measures Outcome: Progressing   Problem: Clinical Measurements: Goal: Diagnostic test results will improve Outcome: Progressing Goal: Respiratory complications will improve Outcome: Progressing   Problem: Safety: Goal: Ability to remain free from injury will improve Outcome: Progressing

## 2023-09-27 NOTE — Plan of Care (Signed)

## 2023-09-27 NOTE — TOC Progression Note (Signed)
Transition of Care [facility]) - Progression Note    Patient Details  Name: Bryan Cabrera MRN: [id number] Date of Birth: February 05, 1941  Transition of Care [facility]) CM/SW Contact  Villa Herb, [place] Phone Number: 09/27/2023, 3:27 PM  Clinical Narrative:    [facility] PT referral sent to [facility] rep Victorino Dike for review. CSW awaits response. HH orders placed by MD. TOC to follow.   Expected Discharge Plan: Home w Home Health Services Barriers to Discharge: Continued Medical Work up  Expected Discharge Plan and Services In-house Referral: Clinical Social Work Discharge Planning Services: CM Consult Post Acute Care Choice: Home Health Living arrangements for the past 2 months: Single Family Home                                       Social Determinants of Health (SDOH) Interventions SDOH Screenings   Food Insecurity: No Food Insecurity (09/24/2023)  Housing: Low Risk  (09/24/2023)  Transportation Needs: No Transportation Needs (09/24/2023)  Utilities: Not At Risk (09/24/2023)  Alcohol Screen: Low Risk  (11/29/2022)  Depression (PHQ2-9): Low Risk  (06/06/2023)  Financial Resource Strain: Low Risk  (11/29/2022)  Physical Activity: Inactive (11/29/2022)  Social Connections: Moderately Isolated (11/29/2022)  Stress: No Stress Concern Present (11/29/2022)  Tobacco Use: Medium Risk (09/24/2023)    Readmission Risk Interventions     No data to display

## 2023-09-28 DIAGNOSIS — R531 Weakness: Secondary | ICD-10-CM

## 2023-09-28 MED ORDER — ACETAMINOPHEN 325 MG PO TABS: 650.0000 mg | ORAL_TABLET | Freq: Four times a day (QID) | ORAL | Status: AC | PRN

## 2023-09-28 MED ORDER — ZINC SULFATE 220 (50 ZN) MG PO CAPS: 220.0000 mg | ORAL_CAPSULE | Freq: Every day | ORAL | 0 refills | Status: DC

## 2023-09-28 MED ORDER — NIRMATRELVIR/RITONAVIR (PAXLOVID)TABLET: 3.0000 | ORAL_TABLET | Freq: Two times a day (BID) | ORAL | Status: DC

## 2023-09-28 MED ORDER — PREDNISONE 20 MG PO TABS: 40.0000 mg | ORAL_TABLET | Freq: Every day | ORAL | 0 refills | Status: DC

## 2023-09-28 MED ORDER — ASCORBIC ACID 500 MG PO TABS: 500.0000 mg | ORAL_TABLET | Freq: Every day | ORAL | 0 refills | Status: DC

## 2023-09-28 MED ORDER — MECLIZINE HCL 25 MG PO TABS: 25.0000 mg | ORAL_TABLET | Freq: Three times a day (TID) | ORAL | 0 refills | Status: DC | PRN

## 2023-09-28 MED ORDER — FOLIC ACID 1 MG PO TABS: 1.0000 mg | ORAL_TABLET | Freq: Every day | ORAL | 0 refills | Status: DC

## 2023-09-28 NOTE — Progress Notes (Signed)
OT Cancellation Note  Patient Details Name: DONEL MORDECAI MRN: [id number] DOB: [birth year]     Cancelled Treatment:     Pt to d/c home today. Will receive HH.     Bevelyn Ngo 09/28/2023, 1:29 PM

## 2023-09-28 NOTE — Discharge Instructions (Signed)
1) You are strongly advised to isolate/quarantine for at least 10 days from the date of your diagnosis with COVID-19 infection--please always wear a mask if you have to go outside the house  2)Please take medications as prescribed--- please note that there were some changes to your medications  3) please hold Eliquis/apixaban while you are taking Paxlovid for COVID  4) please hold Crestor/Rosuvastatin while taking Paxlovid for COVID  5)Avoid ibuprofen/Advil/Aleve/Motrin/Goody Powders/Naproxen/BC powders/Meloxicam/Diclofenac/Indomethacin and other Nonsteroidal anti-inflammatory medications as these will make you more likely to bleed and can cause stomach ulcers, can also cause Kidney problems.
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
## 2023-09-28 NOTE — Care Management Important Message (Signed)
Important Message  Patient Details  Name: Bryan Cabrera MRN: [id number] Date of Birth: 1941/08/14   Important Message Given:  Yes - Medicare IM (spoke with Mr. Clemensen verbally to review letter)     Corey Harold 09/28/2023, 8:42 AM

## 2023-09-28 NOTE — TOC Transition Note (Signed)
Transition of Care [facility]) - Discharge Note   Patient Details  Name: Bryan Cabrera MRN: [id number] Date of Birth: [birth year]  Transition of Care [facility]) CM/SW Contact:  Villa Herb, LCSWA Phone Number: 09/28/2023, 3:10 PM   Clinical Narrative:    CSW updated that pt is medically stable for D/C home today. CSW updated Victorino Dike with [facility] of plan for D/C today, they will follow in the community. MD placed [facility] PT orders. TOC signing off.   Final next level of care: Home w Home Health Services Barriers to Discharge: Barriers Resolved   Patient Goals and CMS Choice Patient states their goals for this hospitalization and ongoing recovery are:: return home CMS Medicare.gov Compare Post Acute Care list provided to:: Patient Choice offered to / list presented to : Patient      Discharge Placement                       Discharge Plan and Services Additional resources added to the After Visit Summary for   In-house Referral: Clinical Social Work Discharge Planning Services: CM Consult Post Acute Care Choice: Home Health                    HH Arranged: PT [facility] Agency: [facility] Date [facility] Agency Contacted: 09/28/23   Representative spoke with at [facility] Agency: Victorino Dike  Social Drivers of Health (SDOH) Interventions SDOH Screenings   Food Insecurity: No Food Insecurity (09/24/2023)  Housing: Low Risk  (09/28/2023)  Transportation Needs: No Transportation Needs (09/24/2023)  Utilities: Not At Risk (09/24/2023)  Alcohol Screen: Low Risk  (11/29/2022)  Depression (PHQ2-9): Low Risk  (06/06/2023)  Financial Resource Strain: Low Risk  (11/29/2022)  Physical Activity: Inactive (11/29/2022)  Social Connections: Moderately Isolated (11/29/2022)  Stress: No Stress Concern Present (11/29/2022)  Tobacco Use: Medium Risk (09/24/2023)     Readmission Risk Interventions     No data to display

## 2023-09-30 ENCOUNTER — Observation Stay (HOSPITAL_COMMUNITY)
Admission: EM | Admit: 2023-09-30 | Discharge: 2023-10-02 | Disposition: A | Discharge status: Home / Self Care | Payer: Medicare PPO | Attending: Emergency Medicine | Admitting: Emergency Medicine

## 2023-09-30 ENCOUNTER — Emergency Department (HOSPITAL_COMMUNITY): Payer: Medicare PPO

## 2023-09-30 ENCOUNTER — Other Ambulatory Visit: Payer: Self-pay

## 2023-09-30 DIAGNOSIS — I4819 Other persistent atrial fibrillation: Secondary | ICD-10-CM

## 2023-09-30 DIAGNOSIS — R29818 Other symptoms and signs involving the nervous system: Secondary | ICD-10-CM | POA: Diagnosis not present

## 2023-09-30 DIAGNOSIS — I2510 Atherosclerotic heart disease of native coronary artery without angina pectoris: Secondary | ICD-10-CM | POA: Diagnosis not present

## 2023-09-30 DIAGNOSIS — R9082 White matter disease, unspecified: Secondary | ICD-10-CM | POA: Diagnosis not present

## 2023-09-30 DIAGNOSIS — Z955 Presence of coronary angioplasty implant and graft: Secondary | ICD-10-CM | POA: Insufficient documentation

## 2023-09-30 DIAGNOSIS — Z8616 Personal history of COVID-19: Secondary | ICD-10-CM | POA: Insufficient documentation

## 2023-09-30 DIAGNOSIS — D72829 Elevated white blood cell count, unspecified: Secondary | ICD-10-CM | POA: Diagnosis not present

## 2023-09-30 DIAGNOSIS — Z79899 Other long term (current) drug therapy: Secondary | ICD-10-CM | POA: Insufficient documentation

## 2023-09-30 DIAGNOSIS — R471 Dysarthria and anarthria: Principal | ICD-10-CM

## 2023-09-30 DIAGNOSIS — R4781 Slurred speech: Secondary | ICD-10-CM | POA: Diagnosis not present

## 2023-09-30 DIAGNOSIS — R627 Adult failure to thrive: Secondary | ICD-10-CM | POA: Insufficient documentation

## 2023-09-30 DIAGNOSIS — G459 Transient cerebral ischemic attack, unspecified: Secondary | ICD-10-CM | POA: Diagnosis not present

## 2023-09-30 DIAGNOSIS — I480 Paroxysmal atrial fibrillation: Secondary | ICD-10-CM | POA: Insufficient documentation

## 2023-09-30 DIAGNOSIS — E669 Obesity, unspecified: Secondary | ICD-10-CM | POA: Diagnosis not present

## 2023-09-30 DIAGNOSIS — Z6830 Body mass index (BMI) 30.0-30.9, adult: Secondary | ICD-10-CM | POA: Insufficient documentation

## 2023-09-30 DIAGNOSIS — R300 Dysuria: Secondary | ICD-10-CM | POA: Diagnosis not present

## 2023-09-30 DIAGNOSIS — E782 Mixed hyperlipidemia: Secondary | ICD-10-CM | POA: Diagnosis not present

## 2023-09-30 DIAGNOSIS — Z7901 Long term (current) use of anticoagulants: Secondary | ICD-10-CM | POA: Diagnosis not present

## 2023-09-30 DIAGNOSIS — E860 Dehydration: Secondary | ICD-10-CM | POA: Diagnosis not present

## 2023-09-30 DIAGNOSIS — E8809 Other disorders of plasma-protein metabolism, not elsewhere classified: Secondary | ICD-10-CM | POA: Diagnosis not present

## 2023-09-30 DIAGNOSIS — E46 Unspecified protein-calorie malnutrition: Secondary | ICD-10-CM | POA: Insufficient documentation

## 2023-09-30 DIAGNOSIS — Z87891 Personal history of nicotine dependence: Secondary | ICD-10-CM | POA: Insufficient documentation

## 2023-09-30 DIAGNOSIS — I5032 Chronic diastolic (congestive) heart failure: Secondary | ICD-10-CM | POA: Insufficient documentation

## 2023-09-30 DIAGNOSIS — R531 Weakness: Secondary | ICD-10-CM | POA: Diagnosis not present

## 2023-09-30 LAB — COMPREHENSIVE METABOLIC PANEL
ALT: 41 U/L (ref 0–44)
AST: 16 U/L (ref 15–41)
Albumin: 2.9 g/dL — ABNORMAL LOW (ref 3.5–5.0)
Alkaline Phosphatase: 46 U/L (ref 38–126)
Anion gap: 8 (ref 5–15)
BUN: 25 mg/dL — ABNORMAL HIGH (ref 8–23)
CO2: 22 mmol/L (ref 22–32)
Calcium: 9.3 mg/dL (ref 8.9–10.3)
Chloride: 106 mmol/L (ref 98–111)
Creatinine, Ser: 1.06 mg/dL (ref 0.61–1.24)
GFR, Estimated: >60 mL/min (ref >60)
Glucose, Bld: 142 mg/dL — ABNORMAL HIGH (ref 70–99)
Potassium: 3.7 mmol/L (ref 3.5–5.1)
Sodium: 136 mmol/L (ref 135–145)
Total Bilirubin: 0.7 mg/dL (ref <1.2)
Total Protein: 6.1 g/dL — ABNORMAL LOW (ref 6.5–8.1)

## 2023-09-30 LAB — CBC WITH DIFFERENTIAL/PLATELET
Abs Immature Granulocytes: 0.19 10*3/uL — ABNORMAL HIGH (ref 0.00–0.07)
Basophils Absolute: 0 10*3/uL (ref 0.0–0.1)
Basophils Relative: 0 %
Eosinophils Absolute: 0 10*3/uL (ref 0.0–0.5)
Eosinophils Relative: 0 %
HCT: 46.3 % (ref 39.0–52.0)
Hemoglobin: 15.8 g/dL (ref 13.0–17.0)
Immature Granulocytes: 1 %
Lymphocytes Relative: 11 %
Lymphs Abs: 1.8 10*3/uL (ref 0.7–4.0)
MCH: 28.7 pg (ref 26.0–34.0)
MCHC: 34.1 g/dL (ref 30.0–36.0)
MCV: 84 fL (ref 80.0–100.0)
Monocytes Absolute: 1.7 10*3/uL — ABNORMAL HIGH (ref 0.1–1.0)
Monocytes Relative: 10 %
Neutro Abs: 12.8 10*3/uL — ABNORMAL HIGH (ref 1.7–7.7)
Neutrophils Relative %: 78 %
Platelets: 232 10*3/uL (ref 150–400)
RBC: 5.51 MIL/uL (ref 4.22–5.81)
RDW: 13.5 % (ref 11.5–15.5)
WBC: 16.5 10*3/uL — ABNORMAL HIGH (ref 4.0–10.5)
nRBC: 0 % (ref 0.0–0.2)

## 2023-09-30 LAB — MAGNESIUM: Magnesium: 2.4 mg/dL (ref 1.7–2.4)

## 2023-09-30 LAB — CBG MONITORING, ED: Glucose-Capillary: 136 mg/dL — ABNORMAL HIGH (ref 70–99)

## 2023-09-30 MED ORDER — ENSURE ENLIVE PO LIQD
237.0000 mL | Freq: Two times a day (BID) | ORAL | Status: DC
  Administered 2023-10-01 (×2): 237 mL via ORAL
  Filled 2023-09-30 (×2): qty 237

## 2023-09-30 MED ORDER — LACTATED RINGERS IV BOLUS
1000.0000 mL | Freq: Once | INTRAVENOUS | Status: AC
  Administered 2023-09-30: 1000 mL via INTRAVENOUS

## 2023-09-30 NOTE — ED Triage Notes (Signed)
Pt bib [facility] from home c/o slurred speech since Friday. Pt was discharged on Friday after being admitted for Covid, per pt after he returned home he began having slurred speech and feels like it is getting worse, is worried he may have had a stroke . No other deficits. Also c/o abdominal pain.
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
## 2023-09-30 NOTE — ED Notes (Signed)
Urinal given
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
## 2023-10-01 ENCOUNTER — Observation Stay (HOSPITAL_BASED_OUTPATIENT_CLINIC_OR_DEPARTMENT_OTHER): Payer: Medicare PPO

## 2023-10-01 ENCOUNTER — Observation Stay (HOSPITAL_COMMUNITY): Payer: Medicare PPO

## 2023-10-01 ENCOUNTER — Encounter (HOSPITAL_COMMUNITY): Payer: Self-pay | Admitting: Internal Medicine

## 2023-10-01 ENCOUNTER — Other Ambulatory Visit (HOSPITAL_COMMUNITY): Payer: Medicare PPO

## 2023-10-01 DIAGNOSIS — E8809 Other disorders of plasma-protein metabolism, not elsewhere classified: Secondary | ICD-10-CM | POA: Insufficient documentation

## 2023-10-01 DIAGNOSIS — E782 Mixed hyperlipidemia: Secondary | ICD-10-CM | POA: Diagnosis not present

## 2023-10-01 DIAGNOSIS — I5032 Chronic diastolic (congestive) heart failure: Secondary | ICD-10-CM | POA: Diagnosis not present

## 2023-10-01 DIAGNOSIS — E860 Dehydration: Secondary | ICD-10-CM | POA: Insufficient documentation

## 2023-10-01 DIAGNOSIS — R300 Dysuria: Secondary | ICD-10-CM | POA: Insufficient documentation

## 2023-10-01 DIAGNOSIS — E46 Unspecified protein-calorie malnutrition: Secondary | ICD-10-CM | POA: Diagnosis not present

## 2023-10-01 DIAGNOSIS — I480 Paroxysmal atrial fibrillation: Secondary | ICD-10-CM | POA: Insufficient documentation

## 2023-10-01 DIAGNOSIS — R29818 Other symptoms and signs involving the nervous system: Secondary | ICD-10-CM | POA: Diagnosis not present

## 2023-10-01 DIAGNOSIS — G319 Degenerative disease of nervous system, unspecified: Secondary | ICD-10-CM | POA: Diagnosis not present

## 2023-10-01 DIAGNOSIS — E669 Obesity, unspecified: Secondary | ICD-10-CM | POA: Insufficient documentation

## 2023-10-01 DIAGNOSIS — D72829 Elevated white blood cell count, unspecified: Secondary | ICD-10-CM | POA: Insufficient documentation

## 2023-10-01 DIAGNOSIS — R4781 Slurred speech: Secondary | ICD-10-CM | POA: Diagnosis not present

## 2023-10-01 LAB — URINALYSIS, ROUTINE W REFLEX MICROSCOPIC
Bacteria, UA: NONE SEEN
Bilirubin Urine: NEGATIVE
Glucose, UA: NEGATIVE mg/dL
Hgb urine dipstick: NEGATIVE
Ketones, ur: NEGATIVE mg/dL
Leukocytes,Ua: NEGATIVE
Nitrite: NEGATIVE
Protein, ur: 30 mg/dL — AB
Specific Gravity, Urine: 1.019 (ref 1.005–1.030)
pH: 5 (ref 5.0–8.0)

## 2023-10-01 LAB — ECHOCARDIOGRAM COMPLETE
AR max vel: 2.67 cm2
AV Area VTI: 3.08 cm2
AV Area mean vel: 2.65 cm2
AV Mean grad: 2 mm[Hg]
AV Peak grad: 3.7 mm[Hg]
Ao pk vel: 0.96 m/s
Area-P 1/2: 5.84 cm2
Height: 68 in
S' Lateral: 3 cm
Weight: 3202.84 [oz_av]

## 2023-10-01 LAB — COMPREHENSIVE METABOLIC PANEL
ALT: 35 U/L (ref 0–44)
AST: 15 U/L (ref 15–41)
Albumin: 2.8 g/dL — ABNORMAL LOW (ref 3.5–5.0)
Alkaline Phosphatase: 46 U/L (ref 38–126)
Anion gap: 9 (ref 5–15)
BUN: 22 mg/dL (ref 8–23)
CO2: 22 mmol/L (ref 22–32)
Calcium: 9.2 mg/dL (ref 8.9–10.3)
Chloride: 105 mmol/L (ref 98–111)
Creatinine, Ser: 1 mg/dL (ref 0.61–1.24)
GFR, Estimated: >60 mL/min (ref >60)
Glucose, Bld: 111 mg/dL — ABNORMAL HIGH (ref 70–99)
Potassium: 3.6 mmol/L (ref 3.5–5.1)
Sodium: 136 mmol/L (ref 135–145)
Total Bilirubin: 0.8 mg/dL (ref <1.2)
Total Protein: 5.8 g/dL — ABNORMAL LOW (ref 6.5–8.1)

## 2023-10-01 LAB — CBC
HCT: 46.1 % (ref 39.0–52.0)
Hemoglobin: 15.8 g/dL (ref 13.0–17.0)
MCH: 28.7 pg (ref 26.0–34.0)
MCHC: 34.3 g/dL (ref 30.0–36.0)
MCV: 83.8 fL (ref 80.0–100.0)
Platelets: 221 10*3/uL (ref 150–400)
RBC: 5.5 MIL/uL (ref 4.22–5.81)
RDW: 13.4 % (ref 11.5–15.5)
WBC: 14.8 10*3/uL — ABNORMAL HIGH (ref 4.0–10.5)
nRBC: 0 % (ref 0.0–0.2)

## 2023-10-01 LAB — HEMOGLOBIN A1C
Hgb A1c MFr Bld: 6.2 % — ABNORMAL HIGH (ref 4.8–5.6)
Mean Plasma Glucose: 131.24 mg/dL

## 2023-10-01 LAB — MAGNESIUM: Magnesium: 2.4 mg/dL (ref 1.7–2.4)

## 2023-10-01 LAB — LIPID PANEL
Cholesterol: 115 mg/dL (ref 0–200)
HDL: 49 mg/dL (ref >40)
LDL Cholesterol: 38 mg/dL (ref 0–99)
Total CHOL/HDL Ratio: 2.3 {ratio}
Triglycerides: 142 mg/dL (ref <150)
VLDL: 28 mg/dL (ref 0–40)

## 2023-10-01 LAB — PHOSPHORUS: Phosphorus: 2.7 mg/dL (ref 2.5–4.6)

## 2023-10-01 MED ORDER — LATANOPROST 0.005 % OP SOLN
1.0000 [drp] | Freq: Every day | OPHTHALMIC | Status: DC
  Administered 2023-10-01: 1 [drp] via OPHTHALMIC
  Filled 2023-10-01: qty 2.5

## 2023-10-01 MED ORDER — ONDANSETRON HCL 4 MG/2ML IJ SOLN
4.0000 mg | Freq: Four times a day (QID) | INTRAMUSCULAR | Status: DC | PRN
  Administered 2023-10-01: 4 mg via INTRAVENOUS
  Filled 2023-10-01: qty 2

## 2023-10-01 MED ORDER — ACETAMINOPHEN 650 MG RE SUPP: 650.0000 mg | Freq: Four times a day (QID) | RECTAL | Status: DC | PRN

## 2023-10-01 MED ORDER — NIRMATRELVIR/RITONAVIR (PAXLOVID)TABLET
3.0000 | ORAL_TABLET | Freq: Two times a day (BID) | ORAL | Status: DC
  Administered 2023-10-01 (×2): 3 via ORAL
  Filled 2023-10-01: qty 30

## 2023-10-01 MED ORDER — APIXABAN 5 MG PO TABS: 5.0000 mg | ORAL_TABLET | Freq: Two times a day (BID) | ORAL | Status: DC

## 2023-10-01 MED ORDER — NITROGLYCERIN 0.4 MG SL SUBL: 0.4000 mg | SUBLINGUAL_TABLET | SUBLINGUAL | Status: DC | PRN

## 2023-10-01 MED ORDER — ONDANSETRON HCL 4 MG PO TABS: 4.0000 mg | ORAL_TABLET | Freq: Four times a day (QID) | ORAL | Status: DC | PRN

## 2023-10-01 MED ORDER — PREDNISONE 20 MG PO TABS
40.0000 mg | ORAL_TABLET | Freq: Every day | ORAL | Status: DC
  Administered 2023-10-01: 40 mg via ORAL
  Filled 2023-10-01 (×2): qty 2

## 2023-10-01 MED ORDER — ROSUVASTATIN CALCIUM 20 MG PO TABS: 20.0000 mg | ORAL_TABLET | Freq: Every day | ORAL | Status: DC

## 2023-10-01 MED ORDER — ROSUVASTATIN CALCIUM 20 MG PO TABS
20.0000 mg | ORAL_TABLET | Freq: Every day | ORAL | Status: DC
  Administered 2023-10-01: 20 mg via ORAL
  Filled 2023-10-01 (×2): qty 1

## 2023-10-01 MED ORDER — ACETAMINOPHEN 325 MG PO TABS
650.0000 mg | ORAL_TABLET | Freq: Four times a day (QID) | ORAL | Status: DC | PRN
  Administered 2023-10-01: 650 mg via ORAL

## 2023-10-01 MED ORDER — PERFLUTREN LIPID MICROSPHERE
1.0000 mL | INTRAVENOUS | Status: AC | PRN
  Administered 2023-10-01: 3 mL via INTRAVENOUS

## 2023-10-01 MED ORDER — ENOXAPARIN SODIUM 100 MG/ML IJ SOSY
1.0000 mg/kg | PREFILLED_SYRINGE | Freq: Two times a day (BID) | INTRAMUSCULAR | Status: DC
  Administered 2023-10-01 – 2023-10-02 (×3): 90 mg via SUBCUTANEOUS
  Filled 2023-10-01 (×3): qty 1

## 2023-10-01 MED ORDER — ISOSORBIDE MONONITRATE ER 60 MG PO TB24
30.0000 mg | ORAL_TABLET | Freq: Every day | ORAL | Status: DC
  Administered 2023-10-01 – 2023-10-02 (×2): 30 mg via ORAL
  Filled 2023-10-01 (×2): qty 1

## 2023-10-01 NOTE — TOC Initial Note (Signed)
Transition of Care [facility]) - Initial/Assessment Note    Patient Details  Name: Bryan Cabrera MRN: [id number] Date of Birth: Oct 24, 1940  Transition of Care [facility]) CM/SW Contact:    Karn Cassis, LCSW Phone Number: 10/01/2023, 10:31 AM  Clinical Narrative:   Pt admitted due to slurred speech. Pt recently d/c from hospital and set up with [facility]. However, pt readmitted prior to start of care. PT now recommending SNF. LCSW discussed with pt. He declines SNF and wants to return home with his wife and have [facility]. Jennifer at [facility] notified of admission. TOC will follow.                 Expected Discharge Plan: Home w Home Health Services Barriers to Discharge: Continued Medical Work up   Patient Goals and CMS Choice Patient states their goals for this hospitalization and ongoing recovery are:: return home   Choice offered to / list presented to : Patient [place] ownership interest in [facility].provided to::  (Pt declines SNF)    Expected Discharge Plan and Services In-house Referral: Clinical Social Work   Post Acute Care Choice: Home Health Living arrangements for the past 2 months: Single Family Home                           HH Arranged: PT HH Agency: [facility] Date HH Agency Contacted: 10/01/23 Time HH Agency Contacted: 1028 Representative spoke with at [facility] Agency: Victorino Dike  Prior Living Arrangements/Services Living arrangements for the past 2 months: Single Family Home Lives with:: Spouse Patient language and need for interpreter reviewed:: Yes Do you feel safe going back to the place where you live?: Yes      Need for Family Participation in Patient Care: Yes (Comment) Care giver support system in place?: Yes (comment) Current home services: DME, Home PT (cane, walker, shower bench) Criminal Activity/Legal Involvement Pertinent to Current Situation/Hospitalization: No - Comment as needed  Activities of  Daily Living   ADL Screening (condition at time of admission) Independently performs ADLs?: No Does the patient have a NEW difficulty with bathing/dressing/toileting/self-feeding that is expected to last >3 days?: No Does the patient have a NEW difficulty with getting in/out of bed, walking, or climbing stairs that is expected to last >3 days?: No Does the patient have a NEW difficulty with communication that is expected to last >3 days?: No Is the patient deaf or have difficulty hearing?: No Does the patient have difficulty seeing, even when wearing glasses/contacts?: No Does the patient have difficulty concentrating, remembering, or making decisions?: No  Permission Sought/Granted                  Emotional Assessment     Affect (typically observed): Appropriate Orientation: : Oriented to Self, Oriented to Place, Oriented to  Time, Oriented to Situation Alcohol / Substance Use: Not Applicable Psych Involvement: No (comment)  Admission diagnosis:  Slurred speech [R47.81] Dysarthria [R47.1] Generalized weakness [R53.1] Patient Active Problem List   Diagnosis Date Noted   Dehydration 10/01/2023   Leukocytosis 10/01/2023   Hypoalbuminemia due to protein-calorie malnutrition (HCC) 10/01/2023   Obesity (BMI 30-39.9) 10/01/2023   Paroxysmal atrial fibrillation (HCC) 10/01/2023   Chronic heart failure with preserved ejection fraction (HFpEF) (HCC) 10/01/2023   Dysuria 10/01/2023   Slurred speech 09/30/2023   Coronary artery disease 06/13/2022   Symptomatic bradycardia 06/12/2022   Lower extremity edema 04/24/2022   TIA (transient ischemic  attack) 12/19/2021   Mixed hyperlipidemia 12/14/2021   Persistent atrial fibrillation (HCC) 12/14/2021   Angina, class IV (HCC) 12/08/2021   Atypical chest pain 11/24/2021   Hypertensive urgency 11/24/2021   COVID-19 virus infection 09/20/2021   Hypokalemia 09/20/2021   Open-angle glaucoma 09/20/2021   Dizziness 09/20/2021   Gait  instability 08/18/2021   Syncope 02/08/2018   Pancreatic cyst 05/01/2016   Gastroesophageal reflux disease without esophagitis 02/10/2016   Mucosal abnormality of stomach    Reflux esophagitis    Epistaxis, recurrent    History of colonic polyps    Diverticulosis of colon without hemorrhage    Nasal vestibulitis 12/15/2015   Dysfunction of both eustachian tubes 12/15/2015   Nausea & vomiting 05/12/2015   Pneumococcal vaccination declined by patient 12/21/2014   Hyperglycemia 11/12/2014   Skin abnormalities 06/25/2012   Neoplasm of uncertain behavior of skin 06/25/2012   Back pain 02/04/2012   Screening for colon cancer 02/04/2012   Medicare annual wellness visit, subsequent 02/04/2012   Hernia 02/04/2012   Male hypogonadism 09/13/2011   Hemorrhoids, external 02/17/2011   Erectile dysfunction 02/17/2011   Cataract 02/17/2011   Recurrent herpes labialis 02/17/2011   Hearing decreased 02/17/2011   Rhinitis, chronic 02/12/2009   HYPERTRIGLYCERIDEMIA 12/14/2008   HYPERGLYCEMIA, BORDERLINE 08/19/2007   Osteoarthritis 08/14/2007   PCP:  [name], MD Pharmacy:  No Pharmacies Listed    Social Drivers of Health (SDOH) Social History: SDOH Screenings   Food Insecurity: No Food Insecurity (10/01/2023)  Housing: Unknown (10/01/2023)  Transportation Needs: No Transportation Needs (10/01/2023)  Utilities: Not At Risk (10/01/2023)  Alcohol Screen: Low Risk  (11/29/2022)  Depression (PHQ2-9): Low Risk  (06/06/2023)  Financial Resource Strain: Low Risk  (11/29/2022)  Physical Activity: Inactive (11/29/2022)  Social Connections: Moderately Isolated (11/29/2022)  Stress: No Stress Concern Present (11/29/2022)  Tobacco Use: Medium Risk (10/01/2023)   SDOH Interventions:     Readmission Risk Interventions     No data to display

## 2023-10-01 NOTE — Progress Notes (Signed)
OT Cancellation Note  Patient Details Name: Bryan Cabrera MRN: [id number] DOB: 12/03/1940   Cancelled Treatment:    Reason Eval/Treat Not Completed: Medical issues which prohibited therapy. Pt reported his slurring of speech had just returned within the past couple minutes. Nursing notified. Will attempt to seep pt later when more appropriate and as time permits.   Calixto Pavel OT, MOT   Danie Chandler 10/01/2023, 3:38 PM

## 2023-10-01 NOTE — Progress Notes (Signed)
*  PRELIMINARY RESULTS* Echocardiogram 2D Echocardiogram has been performed with [facility].  Stacey Drain 10/01/2023, 2:39 PM

## 2023-10-01 NOTE — Progress Notes (Signed)
OT Cancellation Note  Patient Details Name: Bryan Cabrera MRN: [id number] DOB: [birth year]   Cancelled Treatment:    Reason Eval/Treat Not Completed: Patient at procedure or test/ unavailable. Pt leaving floor for imaging on OT arrival this am, will check back as schedule allows.    Ezra Sites, OTR/L  [phone] 10/01/2023, 10:36 AM

## 2023-10-01 NOTE — Progress Notes (Signed)
PROGRESS NOTE   Bryan Cabrera  ELF:[id number] DOB: July 18, 1941 DOA: 09/30/2023 PCP: Bryan Headings, MD   Chief Complaint  Patient presents with   Aphasia   Level of care: Telemetry  Brief Admission History:  82 y.o. male with medical history significant of hyperlipidemia, GERD, paroxysmal A-fib on Eliquis, TIA, CAD who presents to the emergency department from home via EMS due to 2-day onset of slurred speech.  This was associated with generalized weakness.  Slurred speech has been worsening and patient thought that he may have had a stroke.  He complained of difficulty with urination for about denies chest pain, nausea, vomiting, fever, chills, abdominal pain, headache or visual changes.   Patient was recently admitted from 12/9 to 12/13 due to COVID-19 viral infection associated with acute hypoxic respiratory failure.  Hypoxia resolved during hospitalization and was discharged with Paxlovid, steroids, bronchodilators and mucolytics.  Patient states that slow speech started after he returned home from being discharged from the hospital.    ED Course:  In the emergency department, BP was 157/93, other vital signs are within normal range.  Workup in the ED was normal except for WBC of 16.5.  BMP was normal except for BUN of 25 and blood glucose of 142.  Albumin 2.9, magnesium 2.4.  Chest x-ray showed no evidence of acute chest disease.  CT head without contrast showed no acute intracranial CT findings or interval changes.  IV LR 1 L was provided.  Hospitalist was asked admit patient for further evaluation and management.   Assessment and Plan:  Slurred speech resolved, ruled out acute ischemic stroke Patient was admitted to telemetry unit  Bilateral carotid ultrasound pending Echocardiogram pending MRI of brain without contrast negative for acute CVA  Continue statin Continue fall precautions and neuro checks Lipid panel and hemoglobin A1c (LDL 38) (A1c 6.2%) Continue PT/SLP/OT eval  and treat SNF recommended; consulted to [facility]    Dehydration IV hydration was provided   Leukocytosis, unspecified  Suspect leukemoid reaction from recent steroids Trending down No sign of infection found   Dysuria Urinalysis negative for infection   Covid infection - completed 5 day course of paxlovid - currently on isolation  - on room air oxygen   Hypoalbuminemia possibly secondary to moderate protein calorie malnutrition Albumin 2.9, protein supplement will be provided   Class I obesity (BMI 30.64) Diet and lifestyle modification   Paroxysmal A-fib on Eliquis Patient was not on rate control agents Holding Eliquis until 3 days post Paxlovid   Mixed hyperlipidemia Hold Crestor while on Paxlovid due to increased risk of rhabdomyolysis   Chronic HFpEF Stable, euvolemic on exam  Adult Failure to thrive  SNF placement Recommended outpatient palliative medicine consultation   DVT prophylaxis: enoxaparin Code Status: Full  Family Communication:  Disposition: anticipating SNF when bed ready   Consultants:   Procedures:   Antimicrobials:    Subjective: Pt reports that he feels terrible.  He says that he does not have SOB but just feels very weak.  He says his speech seems back to normal now.    Objective: Vitals:   09/30/23 2330 10/01/23 0000 10/01/23 0050 10/01/23 0509  BP: 132/87 (!) 144/81 136/63 137/82  Pulse: 71 66 73 73  Resp: 16  16 (!) 22  Temp:   97.9 F (36.6 C) 98 F (36.7 C)  TempSrc:   Oral Oral  SpO2: 94% 94% 97% 95%  Weight:   90.8 kg   Height:   5\' 8"  (1.727  m)     Intake/Output Summary (Last 24 hours) at 10/01/2023 1225 Last data filed at 10/01/2023 1017 Gross per 24 hour  Intake 1385.6 ml  Output 875 ml  Net 510.6 ml   Filed Weights   10/01/23 0050  Weight: 90.8 kg   Examination:  General exam: very frail and elderly weakened male; normal speech; Appears calm. NAD.   Respiratory system: no rales heard. Respiratory effort  normal. Cardiovascular system: normal S1 & S2 heard. No JVD, murmurs, rubs, gallops or clicks. No pedal edema. Gastrointestinal system: Abdomen is nondistended, soft and nontender. No organomegaly or masses felt. Normal bowel sounds heard. Central nervous system: Alert and oriented. No focal neurological deficits. Extremities: Symmetric 5 x 5 power. Skin: No rashes, lesions or ulcers. Psychiatry: Judgement and insight appear normal. Mood & affect flat.   Data Reviewed: I have personally reviewed following labs and imaging studies  CBC: Recent Labs  Lab 09/24/23 1540 09/30/23 2131 10/01/23 0505  WBC 10.3 16.5* 14.8*  NEUTROABS 8.5* 12.8*  --   HGB 15.7 15.8 15.8  HCT 47.2 46.3 46.1  MCV 86.4 84.0 83.8  PLT 188 232 221    Basic Metabolic Panel: Recent Labs  Lab 09/24/23 1540 09/30/23 2131 10/01/23 0505  NA 137 136 136  K 3.9 3.7 3.6  CL 103 106 105  CO2 24 22 22   GLUCOSE 147* 142* 111*  BUN 12 25* 22  CREATININE 1.00 1.06 1.00  CALCIUM 9.9 9.3 9.2  MG  --  2.4 2.4  PHOS  --   --  2.7    CBG: Recent Labs  Lab 09/30/23 2205  GLUCAP 136*    Recent Results (from the past 240 hours)  SARS Coronavirus 2 by RT PCR (hospital order, performed in [facility] hospital lab) *cepheid single result test* Anterior Nasal Swab     Status: Abnormal   Collection Time: 09/24/23  5:50 PM   Specimen: Anterior Nasal Swab  Result Value Ref Range Status   SARS Coronavirus 2 by RT PCR POSITIVE (A) NEGATIVE Final    Comment: (NOTE) SARS-CoV-2 target nucleic acids are DETECTED  SARS-CoV-2 RNA is generally detectable in upper respiratory specimens  during the acute phase of infection.  Positive results are indicative  of the presence of the identified virus, but do not rule out bacterial infection or co-infection with other pathogens not detected by the test.  Clinical correlation with patient history and  other diagnostic information is necessary to determine patient infection  status.  The expected result is negative.  Fact Sheet for Patients:   RoadLapTop.co.za   Fact Sheet for Healthcare Providers:   http://kim-miller.com/    This test is not yet approved or cleared by the [place] FDA and  has been authorized for detection and/or diagnosis of SARS-CoV-2 by FDA under an Emergency Use Authorization (EUA).  This EUA will remain in effect (meaning this test can be used) for the duration of  the COVID-19 declaration under Section 564(b)(1)  of the Act, 21 U.S.C. section 360-bbb-3(b)(1), unless the authorization is terminated or revoked sooner.   Performed at [facility], [address] [place], [place] [address]   MRSA Next Gen by PCR, Nasal     Status: None   Collection Time: 09/25/23  3:32 AM   Specimen: Nasal Mucosa; Nasal Swab  Result Value Ref Range Status   MRSA by PCR Next Gen NOT DETECTED NOT DETECTED Final    Comment: (NOTE) The GeneXpert MRSA Assay (FDA approved  for NASAL specimens only), is one component of a comprehensive MRSA colonization surveillance program. It is not intended to diagnose MRSA infection nor to guide or monitor treatment for MRSA infections. Test performance is not FDA approved in patients less than 68 years old. Performed at [facility], [address] [place], [place] [address]    Radiology Studies: MR BRAIN WO CONTRAST Result Date: 10/01/2023 CLINICAL DATA:  Neuro deficit, acute, stroke suspected. EXAM: MRI HEAD WITHOUT CONTRAST TECHNIQUE: Multiplanar, multiecho pulse sequences of the brain and surrounding structures were obtained without intravenous contrast. COMPARISON:  Head CT 09/30/2023 and MRI 03/12/2023 FINDINGS: Brain: There is no evidence of an acute infarct, intracranial hemorrhage, mass, midline shift, or extra-axial fluid collection. There is moderate cerebral atrophy. No age advanced cerebral white matter disease is evident. A partially empty sella is again  noted. Vascular: Major intracranial vascular flow voids are preserved. Skull and upper cervical spine: Unremarkable bone marrow signal. Sinuses/Orbits: Bilateral cataract extraction. Mild mucosal thickening in the paranasal sinuses. Clear mastoid air cells. Other: None. IMPRESSION: 1. No acute intracranial abnormality. 2. Moderate cerebral atrophy. Electronically Signed   By: Sebastian Ache M.D.   On: 10/01/2023 09:55   DG Chest Portable 1 View Result Date: 09/30/2023 CLINICAL DATA:  Weakness. Recent COVID diagnosis, recently discharged, presents with slurred speech ongoing x2 days. EXAM: PORTABLE CHEST 1 VIEW COMPARISON:  Portable chest 06/12/2022 FINDINGS: The heart size and mediastinal contours are within normal limits. Both lungs are clear. The visualized skeletal structures are unremarkable. IMPRESSION: No evidence of acute chest disease.  Unchanged. Electronically Signed   By: Almira Bar M.D.   On: 09/30/2023 22:20   CT Head Wo Contrast Result Date: 09/30/2023 CLINICAL DATA:  Neuro deficit, acute, stroke suspected. Complains of increasing slurred speech x2 days. EXAM: CT HEAD WITHOUT CONTRAST TECHNIQUE: Contiguous axial images were obtained from the base of the skull through the vertex without intravenous contrast. RADIATION DOSE REDUCTION: This exam was performed according to the departmental dose-optimization program which includes automated exposure control, adjustment of the mA and/or kV according to patient size and/or use of iterative reconstruction technique. COMPARISON:  Recent head CT 09/24/2023 FINDINGS: Brain: There is moderate global atrophy with atrophic ventriculomegaly, mild small-vessel disease of the cerebral white matter. A partially empty sella is again noted. No new abnormality is seen concerning for an acute cortical based infarct, hemorrhage, or mass effect. No midline shift is evident.  The basal cisterns are clear. Vascular: Scattered calcific plaque in both siphons. No  hyperdense central vessels. Skull: Negative for fractures or focal lesions. Sinuses/Orbits: Patchy membrane disease throughout the ethmoid sinus. Trace fluid in the left frontal sinus. Mild membrane disease and debris noted left sphenoid air cell. Other paranasal sinuses, bilateral mastoid air cells, and middle ears are clear. Negative orbits with old lens extractions. Other: None. IMPRESSION: 1. No acute intracranial CT findings or interval changes. 2. Atrophy and small-vessel disease. 3. Sinus membrane disease. Electronically Signed   By: Almira Bar M.D.   On: 09/30/2023 22:18    Scheduled Meds:  enoxaparin (LOVENOX) injection  1 mg/kg Subcutaneous BID   Followed by   Melene Muller ON 10/03/2023] apixaban  5 mg Oral BID   feeding supplement  237 mL Oral BID BM   isosorbide mononitrate  30 mg Oral Daily   latanoprost  1 drop Both Eyes QHS   predniSONE  40 mg Oral Q breakfast   rosuvastatin  20 mg Oral Daily   Continuous Infusions:   LOS:  0 days   Time spent: 55 mins  Jovann Luse Laural Benes, MD How to contact the [facility] Attending or Consulting provider 7A - 7P or covering provider during after hours 7P -7A, for this patient?  Check the care team in [facility] and look for a) attending/consulting [facility] provider listed and b) the [facility] team listed Log into [email or web address] to find provider on call.  Locate the [facility] provider you are looking for under [facility] and page to a number that you can be directly reached. If you still have difficulty reaching the provider, please page the [facility] (Director on Call) for the Hospitalists listed on amion for assistance.  10/01/2023, 12:25 PM

## 2023-10-01 NOTE — Plan of Care (Signed)

## 2023-10-01 NOTE — Progress Notes (Signed)
Patient is requesting to have family POC [place] contacted  @ [phone] 8a-10a eastern time to provide update re: results of [place] and POC . Patient would also like to have cardiologist Dr. Anne Fu to round with patient tomorrow if possible.

## 2023-10-01 NOTE — Progress Notes (Signed)
SLP Cancellation Note  Patient Details Name: Bryan Cabrera MRN: [id number] DOB: 07/31/1941   Cancelled treatment:       Reason Eval/Treat Not Completed: SLP screened, no needs identified, will sign off (Pt passed the [place] swallow screen and RN reports no concerns regarding swallowing)  Thank you,  Havery Moros, CCC-SLP [phone]  Deem Marmol 10/01/2023, 11:30 AM

## 2023-10-01 NOTE — Plan of Care (Signed)
  Problem: Acute Rehab PT Goals(only PT should resolve) Goal: Pt Will Go Supine/Side To Sit Outcome: Progressing Flowsheets (Taken 10/01/2023 1216) Pt will go Supine/Side to Sit:  with supervision  with contact guard assist Goal: Patient Will Transfer Sit To/From Stand Outcome: Progressing Flowsheets (Taken 10/01/2023 1216) Patient will transfer sit to/from stand:  with contact guard assist  with minimal assist Goal: Pt Will Transfer Bed To Chair/Chair To Bed Outcome: Progressing Flowsheets (Taken 10/01/2023 1216) Pt will Transfer Bed to Chair/Chair to Bed:  with contact guard assist  with min assist Goal: Pt Will Ambulate Outcome: Progressing Flowsheets (Taken 10/01/2023 1216) Pt will Ambulate:  50 feet  with contact guard assist  with minimal assist  with rolling walker   12:18 PM, 10/01/23 Bryan Cabrera, MPT Physical Therapist with [facility] [phone] office [phone] mobile phone

## 2023-10-01 NOTE — Progress Notes (Signed)
Pt. Speech became a little slurred again. Pt. Is still easily understood but will slur a word here and there. NIH score is 1. Spoke with Dr. Laural Benes to update him that pt.started to slur again. Dr. Laural Benes advised to continue Q4H neuro checks and he will see patient and make any changes necessary.

## 2023-10-01 NOTE — Evaluation (Signed)
Physical Therapy Evaluation Patient Details Name: Bryan Cabrera MRN: [id number] DOB: Jun 06, 1941 Today's Date: 10/01/2023  History of Present Illness  Bryan Cabrera is a 82 y.o. male with medical history significant of hyperlipidemia, GERD, paroxysmal A-fib on Eliquis, TIA, CAD who presents to the emergency department from home via EMS due to 2-day onset of slurred speech.  This was associated with generalized weakness.  Slurred speech has been worsening and patient thought that he may have had a stroke.  He complained of difficulty with urination for about denies chest pain, nausea, vomiting, fever, chills, abdominal pain, headache or visual changes.     Patient was recently admitted from 12/9 to 12/13 due to COVID-19 viral infection associated with acute hypoxic respiratory failure.  Hypoxia resolved during hospitalization and was discharged with Paxlovid, steroids, bronchodilators and mucolytics.  Patient states that slow speech started after he returned home from being discharged from the hospital.   Clinical Impression  Patient requires increased time for sitting up at bedside, very unsteady on feet when standing without AD, required use of RW for safety, able to ambulate in room with decreased right ankle dorsiflexion having to occasionally hike his right hip, but when right ankle dorsiflexion tested while seated was 4+/5 and able to complete full active ROM.  Patient requested to go back to bed after therapy due to fatigue.  Patient will benefit from continued skilled physical therapy in hospital and recommended venue below to increase strength, balance, endurance for safe ADLs and gait.       If plan is discharge home, recommend the following: A lot of help with walking and/or transfers;A little help with bathing/dressing/bathroom;Help with stairs or ramp for entrance;Assistance with cooking/housework   Can travel by private vehicle   Yes    Equipment Recommendations Rolling walker (2  wheels)  Recommendations for Other Services       Functional Status Assessment Patient has had a recent decline in their functional status and demonstrates the ability to make significant improvements in function in a reasonable and predictable amount of time.     Precautions / Restrictions Precautions Precautions: Fall Precaution Comments: contact and airborne Restrictions Weight Bearing Restrictions Per Provider Order: No      Mobility  Bed Mobility Overal bed mobility: Needs Assistance Bed Mobility: Supine to Sit, Sit to Supine     Supine to sit: Min assist Sit to supine: Contact guard assist   General bed mobility comments: increased time, labored movement with HOB flat    Transfers Overall transfer level: Needs assistance Equipment used: Rolling walker (2 wheels) Transfers: Sit to/from Stand, Bed to chair/wheelchair/BSC Sit to Stand: Min assist   Step pivot transfers: Min assist       General transfer comment: unsteady labored movement    Ambulation/Gait Ambulation/Gait assistance: Min assist Gait Distance (Feet): 30 Feet Assistive device: Rolling walker (2 wheels) Gait Pattern/deviations: Decreased step length - right, Decreased step length - left, Decreased stride length, Trunk flexed, Decreased dorsiflexion - right Gait velocity: decreased     General Gait Details: slow labored movement with decreased right ankle dorsiflexion when taking steps, with poor return for right heel to toe stepping and limited mostly due to fatigue  Stairs            Wheelchair Mobility     Tilt Bed    Modified Rankin (Stroke Patients Only)       Balance Overall balance assessment: Needs assistance Sitting-balance support: Feet supported, No upper extremity supported Sitting  balance-Leahy Scale: Fair Sitting balance - Comments: fair/good seated at EOB   Standing balance support: During functional activity, No upper extremity supported Standing balance-Leahy  Scale: Poor Standing balance comment: fair using RW                             Pertinent Vitals/Pain Pain Assessment Pain Assessment: No/denies pain    Home Living Family/patient expects to be discharged to:: Private residence Living Arrangements: Spouse/significant other Available Help at Discharge: Family Type of Home: House Home Access: Ramped entrance     Alternate Level Stairs-Number of Steps: ranch house with basement. Home Layout: Two level Home Equipment: Cane - single point;Tub bench;Rolling Walker (2 wheels)      Prior Function Prior Level of Function : Independent/Modified Independent;Working/employed             Mobility Comments: recently limited to household ambulation using RW, was working fulltime as Soil scientist, community ambulation with [employer], drives ADLs Comments: Independent, was helping to take care of his spouse doing the cooking and cleaning     Extremity/Trunk Assessment   Upper Extremity Assessment Upper Extremity Assessment: Defer to OT evaluation    Lower Extremity Assessment Lower Extremity Assessment: Generalized weakness    Cervical / Trunk Assessment Cervical / Trunk Assessment: Kyphotic  Communication   Communication Communication: Difficulty communicating thoughts/reduced clarity of speech;Other (comment) (slight aphasia) Cueing Techniques: Verbal cues;Tactile cues  Cognition Arousal: Alert Behavior During Therapy: WFL for tasks assessed/performed Overall Cognitive Status: Within Functional Limits for tasks assessed                                          General Comments      Exercises     Assessment/Plan    PT Assessment Patient needs continued PT services  PT Problem List Decreased strength;Decreased activity tolerance;Decreased balance;Decreased mobility       PT Treatment Interventions DME instruction;Gait training;Functional mobility training;Therapeutic exercise;Therapeutic  activities;Balance training;Stair training;Patient/family education;Neuromuscular re-education    PT Goals (Current goals can be found in the Care Plan section)  Acute Rehab PT Goals Patient Stated Goal: return home PT Goal Formulation: With patient Time For Goal Achievement: 10/15/23 Potential to Achieve Goals: Good    Frequency Min 3X/week     Co-evaluation               AM-PAC PT "6 Clicks" Mobility  Outcome Measure Help needed turning from your back to your side while in a flat bed without using bedrails?: None Help needed moving from lying on your back to sitting on the side of a flat bed without using bedrails?: A Little Help needed moving to and from a bed to a chair (including a wheelchair)?: A Little Help needed standing up from a chair using your arms (e.g., wheelchair or bedside chair)?: A Little Help needed to walk in hospital room?: A Lot Help needed climbing 3-5 steps with a railing? : A Lot 6 Click Score: 17    End of Session   Activity Tolerance: Patient tolerated treatment well Patient left: in bed;with call bell/phone within reach Nurse Communication: Mobility status PT Visit Diagnosis: Unsteadiness on feet (R26.81);Other abnormalities of gait and mobility (R26.89);Muscle weakness (generalized) (M62.81)    Time: 7829-5621 PT Time Calculation (min) (ACUTE ONLY): 30 min   Charges:   PT Evaluation $PT Eval Moderate  Complexity: 1 Mod PT Treatments $Therapeutic Activity: 23-37 mins PT General Charges $$ ACUTE PT VISIT: 1 Visit         12:12 PM, 10/01/23 Ocie Bob, MPT Physical Therapist with [facility] [phone] office [phone] mobile phone

## 2023-10-01 NOTE — Progress Notes (Signed)
Pt to MRI

## 2023-10-01 NOTE — Hospital Course (Signed)
82 y.o. male with medical history significant of hyperlipidemia, GERD, paroxysmal A-fib on Eliquis, TIA, CAD who presents to the emergency department from home via EMS due to 2-day onset of slurred speech.  This was associated with generalized weakness.  Slurred speech has been worsening and patient thought that he may have had a stroke.  He complained of difficulty with urination for about denies chest pain, nausea, vomiting, fever, chills, abdominal pain, headache or visual changes.   Patient was recently admitted from 12/9 to 12/13 due to COVID-19 viral infection associated with acute hypoxic respiratory failure.  Hypoxia resolved during hospitalization and was discharged with Paxlovid, steroids, bronchodilators and mucolytics.  Patient states that slow speech started after he returned home from being discharged from the hospital.    ED Course:  In the emergency department, BP was 157/93, other vital signs are within normal range.  Workup in the ED was normal except for WBC of 16.5.  BMP was normal except for BUN of 25 and blood glucose of 142.  Albumin 2.9, magnesium 2.4.  Chest x-ray showed no evidence of acute chest disease.  CT head without contrast showed no acute intracranial CT findings or interval changes.  IV LR 1 L was provided.  Hospitalist was asked admit patient for further evaluation and management.

## 2023-10-02 DIAGNOSIS — R4781 Slurred speech: Secondary | ICD-10-CM | POA: Diagnosis not present

## 2023-10-02 DIAGNOSIS — E46 Unspecified protein-calorie malnutrition: Secondary | ICD-10-CM | POA: Diagnosis not present

## 2023-10-02 DIAGNOSIS — E860 Dehydration: Secondary | ICD-10-CM | POA: Diagnosis not present

## 2023-10-02 DIAGNOSIS — I5032 Chronic diastolic (congestive) heart failure: Secondary | ICD-10-CM | POA: Diagnosis not present

## 2023-10-02 DIAGNOSIS — E782 Mixed hyperlipidemia: Secondary | ICD-10-CM | POA: Diagnosis not present

## 2023-10-02 DIAGNOSIS — E8809 Other disorders of plasma-protein metabolism, not elsewhere classified: Secondary | ICD-10-CM | POA: Diagnosis not present

## 2023-10-02 DIAGNOSIS — I480 Paroxysmal atrial fibrillation: Secondary | ICD-10-CM | POA: Diagnosis not present

## 2023-10-02 DIAGNOSIS — D72829 Elevated white blood cell count, unspecified: Secondary | ICD-10-CM | POA: Diagnosis not present

## 2023-10-02 MED ORDER — PREDNISONE 20 MG PO TABS
20.0000 mg | ORAL_TABLET | Freq: Every day | ORAL | Status: DC
Start: 2023-10-03 — End: 2023-10-02

## 2023-10-02 MED ORDER — APIXABAN 5 MG PO TABS: 5.0000 mg | ORAL_TABLET | Freq: Two times a day (BID) | ORAL | Status: DC

## 2023-10-02 MED ORDER — PREDNISONE 20 MG PO TABS
20.0000 mg | ORAL_TABLET | Freq: Every day | ORAL | Status: DC
  Administered 2023-10-02: 20 mg via ORAL

## 2023-10-02 NOTE — Plan of Care (Signed)

## 2023-10-02 NOTE — Discharge Summary (Signed)
Physician Discharge Summary  Bryan Cabrera EAV:[id number] DOB: 07-15-1941 DOA: 09/30/2023  PCP: Bryan Headings, MD  Admit date: 09/30/2023 Discharge date: 10/02/2023  Admitted From:  HOME  Disposition: HOME with home health   Recommendations for Outpatient Follow-up:  Follow up with PCP in 1 weeks Resume apixaban 10/03/23   Home Health: PT, RN, SW   Discharge Condition: STABLE   CODE STATUS: FULL DIET: heart healthy    Brief Hospitalization Summary: Please see all hospital notes, images, labs for full details of the hospitalization. Admission provider HPI: 82 y.o. male with medical history significant of hyperlipidemia, GERD, paroxysmal A-fib on Eliquis, TIA, CAD who presents to the emergency department from home via EMS due to 2-day onset of slurred speech.  This was associated with generalized weakness.  Slurred speech has been worsening and patient thought that he may have had a stroke.  He complained of difficulty with urination for about denies chest pain, nausea, vomiting, fever, chills, abdominal pain, headache or visual changes.   Patient was recently admitted from 12/9 to 12/13 due to COVID-19 viral infection associated with acute hypoxic respiratory failure.  Hypoxia resolved during hospitalization and was discharged with Paxlovid, steroids, bronchodilators and mucolytics.  Patient states that slow speech started after he returned home from being discharged from the hospital.    ED Course:  In the emergency department, BP was 157/93, other vital signs are within normal range.  Workup in the ED was normal except for WBC of 16.5.  BMP was normal except for BUN of 25 and blood glucose of 142.  Albumin 2.9, magnesium 2.4.  Chest x-ray showed no evidence of acute chest disease.  CT head without contrast showed no acute intracranial CT findings or interval changes.  IV LR 1 L was provided.  Hospitalist was asked admit patient for further evaluation and management.  Hospital Course  by problem list   Slurred speech resolved, ruled out acute ischemic stroke Patient was admitted to telemetry unit to rule out CVA  Bilateral carotid ultrasound negative for significant stenosis  Echocardiogram stable from recent studies  MRI of brain without contrast negative for acute CVA  Continue statin Continue fall precautions and neuro checks Lipid panel and hemoglobin A1c (LDL 38) (A1c 6.2%) Continue PT/SLP/OT eval and treat SNF recommended; consulted to [facility]  Pt declined SNF but agreeable to home health care services which we arranged    Dehydration IV hydration was provided   Leukocytosis, unspecified  Suspect leukemoid reaction from recent steroids Trending down No sign of infection found   Dysuria Urinalysis negative for infection   Covid infection - completed 5 day course of paxlovid - currently on isolation  - on room air oxygen  - completed course of oral prednisone; did not resume at discharge  - pt advised to resume apixaban and rosuvastatin 10/03/23    Hypoalbuminemia possibly secondary to moderate protein calorie malnutrition Albumin 2.9, protein supplement will be provided   Class I obesity (BMI 30.64) Diet and lifestyle modification   Paroxysmal A-fib on Eliquis Patient was not on rate control agents Holding Eliquis until 3 days post Paxlovid, can resume on 10/03/23 per pharm D   Mixed hyperlipidemia Hold Crestor while on Paxlovid due to increased risk of rhabdomyolysis Can resume rosuvastatin 10/03/23 per pharm D   Chronic HFpEF Stable, euvolemic on exam   Adult Failure to thrive  SNF placement declined by patient, will accept home health PT, RN, SW  Recommended outpatient palliative medicine consultation  DVT prophylaxis: enoxaparin Code Status: Full  Family Communication: daughter telephone update 12/17  Disposition: Pt declined SNF, [facility] with [facility]   Discharge Diagnoses:  Principal Problem:   Slurred speech Active Problems:    Mixed hyperlipidemia   Dehydration   Leukocytosis   Hypoalbuminemia due to protein-calorie malnutrition (HCC)   Obesity (BMI 30-39.9)   Paroxysmal atrial fibrillation (HCC)   Chronic heart failure with preserved ejection fraction (HFpEF) (HCC)   Dysuria  Discharge Instructions:  Allergies as of 10/02/2023       Reactions   Ciprofloxacin Nausea Only, Other (See Comments)   Dizziness   Metronidazole Nausea Only, Other (See Comments)   Dizziness   Penicillins Other (See Comments)   Family hx of allergy   Quinapril Hcl Cough   Tetracycline Hcl Nausea And Vomiting, Other (See Comments)   Vomiting with illness   Amoxicillin Other (See Comments)   Family hx of allergy        Medication List     STOP taking these medications    amLODipine 2.5 MG tablet Commonly known as: NORVASC   nirmatrelvir/ritonavir 20 x 150 MG & 10 x 100MG  Tabs Commonly known as: PAXLOVID   predniSONE 20 MG tablet Commonly known as: DELTASONE       TAKE these medications    acetaminophen 325 MG tablet Commonly known as: TYLENOL Take 2 tablets (650 mg total) by mouth every 6 (six) hours as needed for mild pain (pain score 1-3), fever or headache.   apixaban 5 MG Tabs tablet Commonly known as: Eliquis Take 1 tablet (5 mg total) by mouth 2 (two) times daily. Start taking on: October 03, 2023   isosorbide mononitrate 30 MG 24 hr tablet Commonly known as: IMDUR TAKE ONE TABLET (30MG  TOTAL) BY MOUTH DAILY   latanoprost 0.005 % ophthalmic solution Commonly known as: XALATAN Place 1 drop into both eyes at bedtime.   meclizine 25 MG tablet Commonly known as: ANTIVERT Take 1 tablet (25 mg total) by mouth 3 (three) times daily as needed for dizziness.   nitroGLYCERIN 0.4 MG SL tablet Commonly known as: Nitrostat Place 1 tablet (0.4 mg total) under the tongue every 5 (five) minutes as needed for chest pain.   rosuvastatin 20 MG tablet Commonly known as: CRESTOR TAKE ONE (1) TABLET BY MOUTH  EVERY DAY        Follow-up Information     [facility].   Specialty: Home Health Services Contact information: [address] [place] [address] [phone]         Bryan Headings, MD. Schedule an appointment as soon as possible for a visit in 1 week(s).   Specialties: Internal Medicine, Pediatrics Why: Hospital Follow Up Contact information: [address] [place]ake [facility] [place] [address] [phone]                Allergies  Allergen Reactions   Ciprofloxacin Nausea Only and Other (See Comments)    Dizziness   Metronidazole Nausea Only and Other (See Comments)    Dizziness   Penicillins Other (See Comments)    Family hx of allergy    Quinapril Hcl Cough   Tetracycline Hcl Nausea And Vomiting and Other (See Comments)    Vomiting with illness   Amoxicillin Other (See Comments)    Family hx of allergy   Allergies as of 10/02/2023       Reactions   Ciprofloxacin Nausea Only, Other (See Comments)   Dizziness  Metronidazole Nausea Only, Other (See Comments)   Dizziness   Penicillins Other (See Comments)   Family hx of allergy   Quinapril Hcl Cough   Tetracycline Hcl Nausea And Vomiting, Other (See Comments)   Vomiting with illness   Amoxicillin Other (See Comments)   Family hx of allergy        Medication List     STOP taking these medications    amLODipine 2.5 MG tablet Commonly known as: NORVASC   nirmatrelvir/ritonavir 20 x 150 MG & 10 x 100MG  Tabs Commonly known as: PAXLOVID   predniSONE 20 MG tablet Commonly known as: DELTASONE       TAKE these medications    acetaminophen 325 MG tablet Commonly known as: TYLENOL Take 2 tablets (650 mg total) by mouth every 6 (six) hours as needed for mild pain (pain score 1-3), fever or headache.   apixaban 5 MG Tabs tablet Commonly known as: Eliquis Take 1 tablet (5 mg total) by mouth 2 (two) times daily. Start taking on: October 03, 2023   isosorbide  mononitrate 30 MG 24 hr tablet Commonly known as: IMDUR TAKE ONE TABLET (30MG  TOTAL) BY MOUTH DAILY   latanoprost 0.005 % ophthalmic solution Commonly known as: XALATAN Place 1 drop into both eyes at bedtime.   meclizine 25 MG tablet Commonly known as: ANTIVERT Take 1 tablet (25 mg total) by mouth 3 (three) times daily as needed for dizziness.   nitroGLYCERIN 0.4 MG SL tablet Commonly known as: Nitrostat Place 1 tablet (0.4 mg total) under the tongue every 5 (five) minutes as needed for chest pain.   rosuvastatin 20 MG tablet Commonly known as: CRESTOR TAKE ONE (1) TABLET BY MOUTH EVERY DAY       Procedures/Studies: ECHOCARDIOGRAM COMPLETE Result Date: 10/01/2023    ECHOCARDIOGRAM REPORT   Patient Name:   Bryan Cabrera Date of Exam: 10/01/2023 Medical Rec #:  [id number]       Height:       68.0 in Accession #:    [id number]      Weight:       200.2 lb Date of Birth:  [birth year]       BSA:          2.045 m Patient Age:    82 years        BP:           137/82 mmHg Patient Gender: M               HR:           68 bpm. [facility] Procedure: 2D Echo, Cardiac Doppler, Color Doppler and Intracardiac            Opacification Agent Indications:    Stroke I63.9  History:        Patient has prior history of Echocardiogram examinations, most                 recent 12/20/2021. CAD, TIA, Arrythmias:Atrial Fibrillation; Risk                 Factors:Dyslipidemia and Hypertension. Dx of COVID-19.  Sonographer:    Celesta Gentile RCS Referring Phys: [id number] OLADAPO ADEFESO IMPRESSIONS  1. Left ventricular ejection fraction, by estimation, is 65 to 70%. The left ventricle has normal function. The left ventricle has no regional wall motion abnormalities. There is moderate left ventricular hypertrophy. Left ventricular diastolic parameters are indeterminate.  2. Right ventricular systolic function is normal. The  right ventricular size is normal. Tricuspid regurgitation signal is inadequate for  assessing PA pressure.  3. The mitral valve is normal in structure. Trivial mitral valve regurgitation. No evidence of mitral stenosis.  4. The aortic valve has an indeterminant number of cusps. Aortic valve regurgitation is not visualized. No aortic stenosis is present. FINDINGS  Left Ventricle: Left ventricular ejection fraction, by estimation, is 65 to 70%. The left ventricle has normal function. The left ventricle has no regional wall motion abnormalities. Definity contrast agent was given IV to delineate the left ventricular  endocardial borders. The left ventricular internal cavity size was normal in size. There is moderate left ventricular hypertrophy. Left ventricular diastolic parameters are indeterminate. Right Ventricle: The right ventricular size is normal. Right vetricular wall thickness was not well visualized. Right ventricular systolic function is normal. Tricuspid regurgitation signal is inadequate for assessing PA pressure. Left Atrium: Left atrial size was normal in size. Right Atrium: Right atrial size was normal in size. Pericardium: There is no evidence of pericardial effusion. Mitral Valve: The mitral valve is normal in structure. Trivial mitral valve regurgitation. No evidence of mitral valve stenosis. Tricuspid Valve: The tricuspid valve is normal in structure. Tricuspid valve regurgitation is not demonstrated. No evidence of tricuspid stenosis. Aortic Valve: The aortic valve has an indeterminant number of cusps. Aortic valve regurgitation is not visualized. No aortic stenosis is present. Aortic valve mean gradient measures 2.0 mmHg. Aortic valve peak gradient measures 3.7 mmHg. Aortic valve area, by VTI measures 3.08 cm. Pulmonic Valve: The pulmonic valve was not well visualized. Pulmonic valve regurgitation is not visualized. No evidence of pulmonic stenosis. Aorta: The aortic root is normal in size and structure. Venous: The inferior vena cava was not well visualized. IAS/Shunts: The  interatrial septum was not well visualized.  LEFT VENTRICLE PLAX 2D LVIDd:         4.70 cm LVIDs:         3.00 cm LV PW:         1.30 cm LV IVS:        1.30 cm LVOT diam:     1.90 cm LV SV:         60 LV SV Index:   29 LVOT Area:     2.84 cm  RIGHT VENTRICLE TAPSE (M-mode): 1.6 cm LEFT ATRIUM             Index        RIGHT ATRIUM           Index LA diam:        3.80 cm 1.86 cm/m   RA Area:     18.40 cm LA Vol (A2C):   85.1 ml 41.62 ml/m  RA Volume:   49.10 ml  24.02 ml/m LA Vol (A4C):   70.9 ml 34.68 ml/m LA Biplane Vol: 77.6 ml 37.96 ml/m  AORTIC VALVE AV Area (Vmax):    2.67 cm AV Area (Vmean):   2.65 cm AV Area (VTI):     3.08 cm AV Vmax:           95.69 cm/s AV Vmean:          65.264 cm/s AV VTI:            0.195 m AV Peak Grad:      3.7 mmHg AV Mean Grad:      2.0 mmHg LVOT Vmax:         90.10 cm/s LVOT Vmean:  61.100 cm/s LVOT VTI:          0.212 m LVOT/AV VTI ratio: 1.09  AORTA Ao Root diam: 3.50 cm MITRAL VALVE MV Area (PHT): 5.84 cm     SHUNTS MV Decel Time: 130 msec     Systemic VTI:  0.21 m MV E velocity: 118.00 cm/s  Systemic Diam: 1.90 cm Dina Rich MD Electronically signed by Dina Rich MD Signature Date/Time: 10/01/2023/3:27:43 PM    Final    US Carotid Bilateral Result Date: 10/01/2023 CLINICAL DATA:  82 year old male with slurred speech EXAM: BILATERAL CAROTID DUPLEX ULTRASOUND TECHNIQUE: Wallace Cullens scale imaging, color Doppler and duplex ultrasound were performed of bilateral carotid and vertebral arteries in the neck. COMPARISON:  None Available. FINDINGS: Criteria: Quantification of carotid stenosis is based on velocity parameters that correlate the residual internal carotid diameter with NASCET-based stenosis levels, using the diameter of the distal internal carotid lumen as the denominator for stenosis measurement. The following velocity measurements were obtained: RIGHT ICA:  Systolic 79 cm/sec, Diastolic 8 cm/sec CCA:  103 cm/sec SYSTOLIC ICA/CCA RATIO:  0.9 ECA:  136  cm/sec LEFT ICA:  Systolic 76 cm/sec, Diastolic 15 cm/sec CCA:  105 cm/sec SYSTOLIC ICA/CCA RATIO:  1.0 ECA:  132 cm/sec Right Brachial SBP: Not acquired Left Brachial SBP: Not acquired RIGHT CAROTID ARTERY: No significant calcified disease of the right common carotid artery. Intermediate waveform maintained. Homogeneous plaque without significant calcifications at the right carotid bifurcation. Low resistance waveform of the right ICA. No significant tortuosity. RIGHT VERTEBRAL ARTERY: Antegrade flow with low resistance waveform. LEFT CAROTID ARTERY: No significant calcified disease of the left common carotid artery. Intermediate waveform maintained. Homogeneous plaque at the left carotid bifurcation without significant calcifications. Low resistance waveform of the left ICA. LEFT VERTEBRAL ARTERY:  Antegrade flow with low resistance waveform. IMPRESSION: Color duplex indicates minimal homogeneous plaque, with no hemodynamically significant stenosis by duplex criteria in the extracranial cerebrovascular circulation. Signed, Yvone Neu. Miachel Roux, RPVI [facility] Radiology Electronically Signed   By: Gilmer Mor D.O.   On: 10/01/2023 14:20   MR BRAIN WO CONTRAST Result Date: 10/01/2023 CLINICAL DATA:  Neuro deficit, acute, stroke suspected. EXAM: MRI HEAD WITHOUT CONTRAST TECHNIQUE: Multiplanar, multiecho pulse sequences of the brain and surrounding structures were obtained without intravenous contrast. COMPARISON:  Head CT 09/30/2023 and MRI 03/12/2023 FINDINGS: Brain: There is no evidence of an acute infarct, intracranial hemorrhage, mass, midline shift, or extra-axial fluid collection. There is moderate cerebral atrophy. No age advanced cerebral white matter disease is evident. A partially empty sella is again noted. Vascular: Major intracranial vascular flow voids are preserved. Skull and upper cervical spine: Unremarkable bone marrow signal.  Sinuses/Orbits: Bilateral cataract extraction. Mild mucosal thickening in the paranasal sinuses. Clear mastoid air cells. Other: None. IMPRESSION: 1. No acute intracranial abnormality. 2. Moderate cerebral atrophy. Electronically Signed   By: Sebastian Ache M.D.   On: 10/01/2023 09:55   DG Chest Portable 1 View Result Date: 09/30/2023 CLINICAL DATA:  Weakness. Recent COVID diagnosis, recently discharged, presents with slurred speech ongoing x2 days. EXAM: PORTABLE CHEST 1 VIEW COMPARISON:  Portable chest 06/12/2022 FINDINGS: The heart size and mediastinal contours are within normal limits. Both lungs are clear. The visualized skeletal structures are unremarkable. IMPRESSION: No evidence of acute chest disease.  Unchanged. Electronically Signed   By: Almira Bar M.D.   On: 09/30/2023 22:20   CT Head Wo Contrast Result Date: 09/30/2023 CLINICAL DATA:  Neuro deficit, acute, stroke  suspected. Complains of increasing slurred speech x2 days. EXAM: CT HEAD WITHOUT CONTRAST TECHNIQUE: Contiguous axial images were obtained from the base of the skull through the vertex without intravenous contrast. RADIATION DOSE REDUCTION: This exam was performed according to the departmental dose-optimization program which includes automated exposure control, adjustment of the mA and/or kV according to patient size and/or use of iterative reconstruction technique. COMPARISON:  Recent head CT 09/24/2023 FINDINGS: Brain: There is moderate global atrophy with atrophic ventriculomegaly, mild small-vessel disease of the cerebral white matter. A partially empty sella is again noted. No new abnormality is seen concerning for an acute cortical based infarct, hemorrhage, or mass effect. No midline shift is evident.  The basal cisterns are clear. Vascular: Scattered calcific plaque in both siphons. No hyperdense central vessels. Skull: Negative for fractures or focal lesions. Sinuses/Orbits: Patchy membrane disease throughout the ethmoid  sinus. Trace fluid in the left frontal sinus. Mild membrane disease and debris noted left sphenoid air cell. Other paranasal sinuses, bilateral mastoid air cells, and middle ears are clear. Negative orbits with old lens extractions. Other: None. IMPRESSION: 1. No acute intracranial CT findings or interval changes. 2. Atrophy and small-vessel disease. 3. Sinus membrane disease. Electronically Signed   By: Almira Bar M.D.   On: 09/30/2023 22:18   CT ABDOMEN PELVIS W CONTRAST Result Date: 09/24/2023 CLINICAL DATA:  Nausea and vomiting with recent fall, initial encounter EXAM: CT ABDOMEN AND PELVIS WITH CONTRAST TECHNIQUE: Multidetector CT imaging of the abdomen and pelvis was performed using the standard protocol following bolus administration of intravenous contrast. RADIATION DOSE REDUCTION: This exam was performed according to the departmental dose-optimization program which includes automated exposure control, adjustment of the mA and/or kV according to patient size and/or use of iterative reconstruction technique. CONTRAST:  75mL OMNIPAQUE IOHEXOL 350 MG/ML SOLN COMPARISON:  10/31/2021 FINDINGS: Lower chest: No acute abnormality. Hepatobiliary: Gallbladder is well distended. Dependent small gallstones are seen. No complicating factors are noted. The liver is within normal limits. Pancreas: Pancreas again demonstrates fatty infiltration. Fluid attenuation cyst is noted anteriorly adjacent to the head of the pancreas similar to that seen on the prior exam. Additionally a new 12 mm pancreatic cyst is identified slightly increased from the prior exam at which time it measured less than 5 mm. Spleen: Normal in size without focal abnormality. Adrenals/Urinary Tract: Adrenal glands are within normal limits. Kidneys demonstrate a normal enhancement pattern bilaterally. Parapelvic cyst is noted on the left and cortical cyst on the right stable from the prior study. No further follow-up is recommended. No renal  calculi or obstructive changes are seen. The bladder is well distended. Stomach/Bowel: Scattered diverticular change of the colon is noted without evidence of diverticulitis. The appendix is within normal limits. Small bowel and stomach are unremarkable. Vascular/Lymphatic: Aortic atherosclerosis. No enlarged abdominal or pelvic lymph nodes. Reproductive: Prostate is unremarkable. Other: No abdominal wall hernia or abnormality. No abdominopelvic ascites. Musculoskeletal: No acute or significant osseous findings. IMPRESSION: Cholelithiasis without complicating factors. Diffuse pancreatic atrophy with scattered cysts. Stable cyst in the pancreatic head are noted. A new 12 mm cyst is noted in the midportion of the body posteriorly increased from the prior exam. Given the patient's age no further follow-up is recommended. The cysts have waxed and waned over multiple prior exams. No definitive solid mass is seen. Diverticulosis without diverticulitis. Electronically Signed   By: Alcide Clever M.D.   On: 09/24/2023 19:26   CT Head Wo Contrast Result Date: 09/24/2023 CLINICAL DATA:  Dizziness.  Nausea and vomiting.  Fall. EXAM: CT HEAD WITHOUT CONTRAST TECHNIQUE: Contiguous axial images were obtained from the base of the skull through the vertex without intravenous contrast. RADIATION DOSE REDUCTION: This exam was performed according to the departmental dose-optimization program which includes automated exposure control, adjustment of the mA and/or kV according to patient size and/or use of iterative reconstruction technique. COMPARISON:  Head CT 03/11/2023 and MRI 03/12/2023 FINDINGS: Brain: There is no evidence of an acute infarct, intracranial hemorrhage, mass, midline shift, or extra-axial fluid collection. Moderate cerebral atrophy is unchanged. A partially empty sella is again noted. Vascular: Calcified atherosclerosis at the skull base. No hyperdense vessel. Skull: No acute fracture or suspicious osseous lesion.  Sinuses/Orbits: Mild mucosal thickening in the included paranasal sinuses. Clear mastoid air cells. Bilateral cataract extraction. Other: None. IMPRESSION: No evidence of acute intracranial abnormality. Electronically Signed   By: Sebastian Ache M.D.   On: 09/24/2023 16:48     Subjective: Pt reports speech has been clear, agreeable to home health services, declined SNF, no other complaints.    Discharge Exam: Vitals:   10/02/23 0431 10/02/23 0944  BP: 113/81 130/77  Pulse: 65 94  Resp:    Temp: (!) 97.5 F (36.4 C) 98 F (36.7 C)  SpO2: 97% 96%   Vitals:   10/01/23 1435 10/01/23 2038 10/02/23 0431 10/02/23 0944  BP: 133/78 135/74 113/81 130/77  Pulse: 77 73 65 94  Resp: (!) 22     Temp: 98.4 F (36.9 C)  (!) 97.5 F (36.4 C) 98 F (36.7 C)  TempSrc:   Oral Oral  SpO2: 93% 97% 97% 96%  Weight:      Height:       General: Pt is alert, awake, not in acute distress Cardiovascular: normal S1/S2 +, no rubs, no gallops Respiratory: CTA bilaterally, no wheezing, no rhonchi Abdominal: Soft, NT, ND, bowel sounds + Extremities: no edema, no cyanosis Neurological: nonfocal exam   The results of significant diagnostics from this hospitalization (including imaging, microbiology, ancillary and laboratory) are listed below for reference.     Microbiology: Recent Results (from the past 240 hours)  SARS Coronavirus 2 by RT PCR (hospital order, performed in [facility] hospital lab) *cepheid single result test* Anterior Nasal Swab     Status: Abnormal   Collection Time: 09/24/23  5:50 PM   Specimen: Anterior Nasal Swab  Result Value Ref Range Status   SARS Coronavirus 2 by RT PCR POSITIVE (A) NEGATIVE Final    Comment: (NOTE) SARS-CoV-2 target nucleic acids are DETECTED  SARS-CoV-2 RNA is generally detectable in upper respiratory specimens  during the acute phase of infection.  Positive results are indicative  of the presence of the identified virus, but do not rule out bacterial  infection or co-infection with other pathogens not detected by the test.  Clinical correlation with patient history and  other diagnostic information is necessary to determine patient infection status.  The expected result is negative.  Fact Sheet for Patients:   RoadLapTop.co.za   Fact Sheet for Healthcare Providers:   http://kim-miller.com/    This test is not yet approved or cleared by the [place] FDA and  has been authorized for detection and/or diagnosis of SARS-CoV-2 by FDA under an Emergency Use Authorization (EUA).  This EUA will remain in effect (meaning this test can be used) for the duration of  the COVID-19 declaration under Section 564(b)(1)  of the Act, 21 U.S.C. section 360-bbb-3(b)(1), unless the authorization is terminated or revoked  sooner.   Performed at [facility], [address], [place], [place] [address]   MRSA Next Gen by PCR, Nasal     Status: None   Collection Time: 09/25/23  3:32 AM   Specimen: Nasal Mucosa; Nasal Swab  Result Value Ref Range Status   MRSA by PCR Next Gen NOT DETECTED NOT DETECTED Final    Comment: (NOTE) The GeneXpert MRSA Assay (FDA approved for NASAL specimens only), is one component of a comprehensive MRSA colonization surveillance program. It is not intended to diagnose MRSA infection nor to guide or monitor treatment for MRSA infections. Test performance is not FDA approved in patients less than 3 years old. Performed at [facility], [address] [place], [place] [address]      Labs: BNP (last 3 results) No results for input(s): "BNP" in the last 8760 hours. Basic Metabolic Panel: Recent Labs  Lab 09/30/23 2131 10/01/23 0505  NA 136 136  K 3.7 3.6  CL 106 105  CO2 22 22  GLUCOSE 142* 111*  BUN 25* 22  CREATININE 1.06 1.00  CALCIUM 9.3 9.2  MG 2.4 2.4  PHOS  --  2.7   Liver Function Tests: Recent Labs  Lab 09/30/23 2131 10/01/23 0505  AST 16 15  ALT 41  35  ALKPHOS 46 46  BILITOT 0.7 0.8  PROT 6.1* 5.8*  ALBUMIN 2.9* 2.8*   No results for input(s): "LIPASE", "AMYLASE" in the last 168 hours. No results for input(s): "AMMONIA" in the last 168 hours. CBC: Recent Labs  Lab 09/30/23 2131 10/01/23 0505  WBC 16.5* 14.8*  NEUTROABS 12.8*  --   HGB 15.8 15.8  HCT 46.3 46.1  MCV 84.0 83.8  PLT 232 221   Cardiac Enzymes: No results for input(s): "CKTOTAL", "CKMB", "CKMBINDEX", "TROPONINI" in the last 168 hours. BNP: Invalid input(s): "POCBNP" CBG: Recent Labs  Lab 09/30/23 2205  GLUCAP 136*   D-Dimer No results for input(s): "DDIMER" in the last 72 hours. Hgb A1c Recent Labs    09/30/23 2129  HGBA1C 6.2*   Lipid Profile Recent Labs    10/01/23 0505  CHOL 115  HDL 49  LDLCALC 38  TRIG 142  CHOLHDL 2.3   Thyroid function studies No results for input(s): "TSH", "T4TOTAL", "T3FREE", "THYROIDAB" in the last 72 hours.  Invalid input(s): "FREET3" Anemia work up No results for input(s): "VITAMINB12", "FOLATE", "FERRITIN", "TIBC", "IRON", "RETICCTPCT" in the last 72 hours. Urinalysis    Component Value Date/Time   COLORURINE YELLOW 10/01/2023 0600   APPEARANCEUR CLEAR 10/01/2023 0600   LABSPEC 1.019 10/01/2023 0600   PHURINE 5.0 10/01/2023 0600   GLUCOSEU NEGATIVE 10/01/2023 0600   HGBUR NEGATIVE 10/01/2023 0600   HGBUR negative 11/21/2007 1012   BILIRUBINUR NEGATIVE 10/01/2023 0600   BILIRUBINUR neg 04/15/2021 1427   KETONESUR NEGATIVE 10/01/2023 0600   PROTEINUR 30 (A) 10/01/2023 0600   UROBILINOGEN 0.2 04/15/2021 1427   UROBILINOGEN 0.2 08/15/2015 2350   NITRITE NEGATIVE 10/01/2023 0600   LEUKOCYTESUR NEGATIVE 10/01/2023 0600   Sepsis Labs Recent Labs  Lab 09/30/23 2131 10/01/23 0505  WBC 16.5* 14.8*   Microbiology Recent Results (from the past 240 hours)  SARS Coronavirus 2 by RT PCR (hospital order, performed in [facility] hospital lab) *cepheid single result test* Anterior Nasal Swab     Status:  Abnormal   Collection Time: 09/24/23  5:50 PM   Specimen: Anterior Nasal Swab  Result Value Ref Range Status   SARS Coronavirus 2 by RT PCR POSITIVE (  A) NEGATIVE Final    Comment: (NOTE) SARS-CoV-2 target nucleic acids are DETECTED  SARS-CoV-2 RNA is generally detectable in upper respiratory specimens  during the acute phase of infection.  Positive results are indicative  of the presence of the identified virus, but do not rule out bacterial infection or co-infection with other pathogens not detected by the test.  Clinical correlation with patient history and  other diagnostic information is necessary to determine patient infection status.  The expected result is negative.  Fact Sheet for Patients:   RoadLapTop.co.za   Fact Sheet for Healthcare Providers:   http://kim-miller.com/    This test is not yet approved or cleared by the [place] FDA and  has been authorized for detection and/or diagnosis of SARS-CoV-2 by FDA under an Emergency Use Authorization (EUA).  This EUA will remain in effect (meaning this test can be used) for the duration of  the COVID-19 declaration under Section 564(b)(1)  of the Act, 21 U.S.C. section 360-bbb-3(b)(1), unless the authorization is terminated or revoked sooner.   Performed at [facility], [address] [place], [place] [address]   MRSA Next Gen by PCR, Nasal     Status: None   Collection Time: 09/25/23  3:32 AM   Specimen: Nasal Mucosa; Nasal Swab  Result Value Ref Range Status   MRSA by PCR Next Gen NOT DETECTED NOT DETECTED Final    Comment: (NOTE) The GeneXpert MRSA Assay (FDA approved for NASAL specimens only), is one component of a comprehensive MRSA colonization surveillance program. It is not intended to diagnose MRSA infection nor to guide or monitor treatment for MRSA infections. Test performance is not FDA approved in patients less than 53 years old. Performed at [facility], [address] [place], [place] [address]    Time coordinating discharge: 38 mins  SIGNED:  Standley Dakins, MD  [facility] 10/02/2023, 12:34 PM How to contact the [facility] Attending or Consulting provider 7A - 7P or covering provider during after hours 7P -7A, for this patient?  Check the care team in [facility] and look for a) attending/consulting TRH provider listed and b) the [facility] team listed Log into [email or web address] and use 's universal password to access. If you do not have the password, please contact the hospital operator. Locate the [facility] provider you are looking for under [facility] and page to a number that you can be directly reached. If you still have difficulty reaching the provider, please page the [facility] (Director on Call) for the Hospitalists listed on amion for assistance.

## 2023-10-02 NOTE — Plan of Care (Signed)
  Problem: Acute Rehab OT Goals (only OT should resolve) Goal: Pt. Will Perform Grooming Flowsheets (Taken 10/02/2023 0958) Pt Will Perform Grooming:  with modified independence  standing Goal: Pt. Will Transfer To Toilet Flowsheets (Taken 10/02/2023 [id number]) Pt Will Transfer to Toilet:  with modified independence  ambulating Goal: Pt. Will Perform Toileting-Clothing Manipulation Flowsheets (Taken 10/02/2023 0958) Pt Will Perform Toileting - Clothing Manipulation and hygiene: with modified independence  Yaquelin Langelier OT, MOT

## 2023-10-02 NOTE — Discharge Instructions (Addendum)
Stop taking prednisone and paxlovid You can start back taking apixaban (eliquis) and rosuvastatin (crestor) tomorrow 10/03/23.     IMPORTANT INFORMATION: PAY CLOSE ATTENTION   PHYSICIAN DISCHARGE INSTRUCTIONS  Follow with Primary care provider  Panosh, [name], MD  and other consultants as instructed by your Hospitalist Physician  SEEK MEDICAL CARE OR RETURN TO EMERGENCY ROOM IF SYMPTOMS COME BACK, WORSEN OR NEW PROBLEM DEVELOPS   Please note: You were cared for by a hospitalist during your hospital stay. Every effort will be made to forward records to your primary care provider.  You can request that your primary care provider send for your hospital records if they have not received them.  Once you are discharged, your primary care physician will handle any further medical issues. Please note that NO REFILLS for any discharge medications will be authorized once you are discharged, as it is imperative that you return to your primary care physician (or establish a relationship with a primary care physician if you do not have one) for your post hospital discharge needs so that they can reassess your need for medications and monitor your lab values.  Please get a complete blood count and chemistry panel checked by your Primary MD at your next visit, and again as instructed by your Primary MD.  Get Medicines reviewed and adjusted: Please take all your medications with you for your next visit with your Primary MD  Laboratory/radiological data: Please request your Primary MD to go over all hospital tests and procedure/radiological results at the follow up, please ask your primary care provider to get all Hospital records sent to his/her office.  In some cases, they will be blood work, cultures and biopsy results pending at the time of your discharge. Please request that your primary care provider follow up on these results.  If you are diabetic, please bring your blood sugar readings with you to  your follow up appointment with primary care.    Please call and make your follow up appointments as soon as possible.    Also Note the following: If you experience worsening of your admission symptoms, develop shortness of breath, life threatening emergency, suicidal or homicidal thoughts you must seek medical attention immediately by calling 911 or calling your MD immediately  if symptoms less severe.  You must read complete instructions/literature along with all the possible adverse reactions/side effects for all the Medicines you take and that have been prescribed to you. Take any new Medicines after you have completely understood and accpet all the possible adverse reactions/side effects.   Do not drive when taking Pain medications or sleeping medications (Benzodiazepines)  Do not take more than prescribed Pain, Sleep and Anxiety Medications. It is not advisable to combine anxiety,sleep and pain medications without talking with your primary care practitioner  Special Instructions: If you have smoked or chewed Tobacco  in the last 2 yrs please stop smoking, stop any regular Alcohol  and or any Recreational drug use.  Wear Seat belts while driving.  Do not drive if taking any narcotic, mind altering or controlled substances or recreational drugs or alcohol.

## 2023-10-02 NOTE — Care Management Obs Status (Signed)
MEDICARE OBSERVATION STATUS NOTIFICATION   Patient Details  Name: Bryan Cabrera MRN: [id number] Date of Birth: 09-Jul-1941   Medicare Observation Status Notification Given:  Yes    Corey Harold 10/02/2023, 9:43 AM

## 2023-10-02 NOTE — Evaluation (Signed)
Occupational Therapy Evaluation Patient Details Name: Bryan Cabrera MRN: [id number] DOB: December 31, 1940 Today's Date: 10/02/2023   History of Present Illness Bryan Cabrera is a 82 y.o. male with medical history significant of hyperlipidemia, GERD, paroxysmal A-fib on Eliquis, TIA, CAD who presents to the emergency department from home via EMS due to 2-day onset of slurred speech.  This was associated with generalized weakness.  Slurred speech has been worsening and patient thought that he may have had a stroke.  He complained of difficulty with urination for about denies chest pain, nausea, vomiting, fever, chills, abdominal pain, headache or visual changes.     Patient was recently admitted from 12/9 to 12/13 due to COVID-19 viral infection associated with acute hypoxic respiratory failure.  Hypoxia resolved during hospitalization and was discharged with Paxlovid, steroids, bronchodilators and mucolytics.  Patient states that slow speech started after he returned home from being discharged from the hospital.   Clinical Impression   Pt agreeable to OT evaluation. Pt reported he was going home today. During session pt required CGA for ambulation with RW. Unsteady in standing with inability to take lateral steps without RW. Mod I for bed mobility with increased time. No physical assist needed for lower body dressing today. 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 Pt was left seated at the EOB per pt's preference. Bed alarm set. Pt will benefit from continued OT in the hospital and recommended venue below to increase strength, balance, and endurance for safe ADL's.          If plan is discharge home, recommend the following: A little help with walking and/or transfers;A little help with bathing/dressing/bathroom;Assistance with cooking/housework;Assist for transportation;Help with stairs or ramp for entrance    Functional Status Assessment  Patient has had a recent decline in their functional status  and demonstrates the ability to make significant improvements in function in a reasonable and predictable amount of time.  Equipment Recommendations  None recommended by OT           Precautions / Restrictions Precautions Precautions: Fall Restrictions Weight Bearing Restrictions Per Provider Order: No      Mobility Bed Mobility Overal bed mobility: Needs Assistance Bed Mobility: Supine to Sit     Supine to sit: Modified independent (Device/Increase time), Supervision     General bed mobility comments: mild extended time and labored effort    Transfers Overall transfer level: Needs assistance Equipment used: Rolling walker (2 wheels) Transfers: Sit to/from Stand, Bed to chair/wheelchair/BSC Sit to Stand: Contact guard assist     Step pivot transfers: Contact guard assist     General transfer comment: Unsteady for sit to stand without RW. More steady with RW needing CGA.      Balance Overall balance assessment: Needs assistance Sitting-balance support: Feet supported, No upper extremity supported Sitting balance-Leahy Scale: Good Sitting balance - Comments: fair/good seated at EOB   Standing balance support: Bilateral upper extremity supported, During functional activity Standing balance-Leahy Scale: Fair Standing balance comment: fair using RW                           ADL either performed or assessed with clinical judgement   ADL Overall ADL's : Needs assistance/impaired     Grooming: Contact guard assist;Minimal assistance;Standing       Lower Body Bathing: Set up;Sitting/lateral leans   Upper Body Dressing : Set up;Sitting   Lower Body Dressing: Set up;Sitting/lateral leans   Toilet Transfer:  Contact guard assist;Rolling walker (2 wheels);Ambulation Toilet Transfer Details (indicate cue type and reason): EOB to toilet; use of grab bar on L side. Toileting- Clothing Manipulation and Hygiene: Contact guard assist;Modified  independent;Sitting/lateral lean;Sit to/from stand       Functional mobility during ADLs: Contact guard assist;Rolling walker (2 wheels) General ADL Comments: Able to ambulate in room with RW and CGA. Unsteady without RW.     Vision Baseline Vision/History: 1 Wears glasses Ability to See in Adequate Light: 1 Impaired Patient Visual Report: No change from baseline Vision Assessment?: Yes Additional Comments: mild L lower quadrant peripher vision deficit possibly noted. Not significant, but more than other areas of peripheral vision.     Perception Perception: Not tested       Praxis Praxis: Not tested       Pertinent Vitals/Pain Pain Assessment Pain Assessment: No/denies pain     Extremity/Trunk Assessment Upper Extremity Assessment Upper Extremity Assessment: Overall WFL for tasks assessed   Lower Extremity Assessment Lower Extremity Assessment: Defer to PT evaluation   Cervical / Trunk Assessment Cervical / Trunk Assessment: Kyphotic   Communication Communication Communication: No apparent difficulties   Cognition Arousal: Alert Behavior During Therapy: WFL for tasks assessed/performed Overall Cognitive Status: Within Functional Limits for tasks assessed                                                        Home Living Family/patient expects to be discharged to:: Private residence Living Arrangements: Spouse/significant other Available Help at Discharge: Family Type of Home: House Home Access: Ramped entrance     Home Layout: Two level Alternate Level Stairs-Number of Steps: ranch house with basement. Alternate Level Stairs-Rails: Left Bathroom Shower/Tub: Producer, television/film/video: Standard Bathroom Accessibility: Yes   Home Equipment: Cane - single point;Tub bench;Rolling Walker (2 wheels)   Additional Comments: per chart review      Prior Functioning/Environment Prior Level of Function : Independent/Modified  Independent;Working/employed             Mobility Comments: recently limited to household ambulation using RW, was working fulltime as Soil scientist, community ambulation with [employer], drives ADLs Comments: Independent, was helping to take care of his spouse doing the cooking and cleaning (per chart)        OT Problem List: Decreased strength;Decreased activity tolerance      OT Treatment/Interventions: Self-care/ADL training;Therapeutic exercise;DME and/or AE instruction;Therapeutic activities;Patient/family education    OT Goals(Current goals can be found in the care plan section) Acute Rehab OT Goals Patient Stated Goal: return home OT Goal Formulation: With patient Time For Goal Achievement: 10/16/23 Potential to Achieve Goals: Good  OT Frequency: Min 1X/week                  AM-PAC OT "6 Clicks" Daily Activity     Outcome Measure Help from another person eating meals?: None Help from another person taking care of personal grooming?: A Little Help from another person toileting, which includes using toliet, bedpan, or urinal?: A Little Help from another person bathing (including washing, rinsing, drying)?: A Little Help from another person to put on and taking off regular upper body clothing?: A Little Help from another person to put on and taking off regular lower body clothing?: A Little 6 Click Score: 19   End  of Session Equipment Utilized During Treatment: Rolling walker (2 wheels);Gait belt  Activity Tolerance: Patient tolerated treatment well Patient left: in bed;with call bell/phone within reach;with bed alarm set  OT Visit Diagnosis: Unsteadiness on feet (R26.81);Other abnormalities of gait and mobility (R26.89);Other symptoms and signs involving the nervous system (U98.119)                Time: 1478-2956 OT Time Calculation (min): 14 min Charges:  OT General Charges $OT Visit: 1 Visit OT Evaluation $OT Eval Low Complexity: 1 Low  Chiyo Fay  OT, MOT  Danie Chandler 10/02/2023, 9:56 AM

## 2023-10-02 NOTE — Progress Notes (Signed)
Patient discharged home with instructions given on medications and follow up visits,verbalized understanding .Prescriptions sent to Pharmacy of choice documented on AVS.IV discontinued,catheter intact. Accompanied by staff to an awaiting vehicle.

## 2023-10-02 NOTE — Progress Notes (Signed)
Mobility Specialist Progress Note:    10/02/23 1405  Mobility  Activity Ambulated with assistance in room;Stood at bedside  Level of Assistance Contact guard assist, steadying assist  Assistive Device Front wheel walker  Distance Ambulated (ft) 30 ft  Range of Motion/Exercises Active;All extremities  Activity Response Tolerated well  Mobility Referral Yes  Mobility visit 1 Mobility  Mobility Specialist Start Time (ACUTE ONLY) 1350  Mobility Specialist Stop Time (ACUTE ONLY) 1406  Mobility Specialist Time Calculation (min) (ACUTE ONLY) 16 min   Pt received in bed, eager for mobility. Required CGA to stand and ambulate with RW. Tolerated well. Pt weak and unsteady. Returned to bed, left supine. Alarm on, all needs met.   Lawerance Bach Mobility Specialist Please contact via Special educational needs teacher or  Rehab office at [phone]

## 2023-10-02 NOTE — TOC Transition Note (Signed)
Transition of Care [facility]) - Discharge Note   Patient Details  Name: Bryan Cabrera MRN: [id number] Date of Birth: Dec 17, 1940  Transition [facility] Care [facility]) CM/SW Contact:  Karn Cassis, LCSW Phone Number: 10/02/2023, 12:43 PM   Clinical Narrative:  Pt d/c today. Jennifer at [facility] aware of d/c and family requesting SW. Order in for HHPT, RN, SW. Therapy recommending walker. Pt said he had walker at home.        Barriers to Discharge: Barriers Resolved   Patient Goals and CMS Choice Patient states their goals for this hospitalization and ongoing recovery are:: return home   Choice offered to / list presented to : Patient [place] ownership interest in [facility].provided to::  (Pt declines SNF)    Discharge Placement                       Discharge Plan and Services Additional resources added to the After Visit Summary for   In-house Referral: Clinical Social Work   Post Acute Care Choice: Home Health                    HH Arranged: PT, RN, Social Work [facility] Agency: [facility] Date [facility] Agency Contacted: 10/01/23 Time HH Agency Contacted: 1028 Representative spoke with at [facility] Agency: Victorino Dike  Social Drivers of Health (SDOH) Interventions SDOH Screenings   Food Insecurity: No Food Insecurity (10/01/2023)  Housing: Unknown (10/01/2023)  Transportation Needs: No Transportation Needs (10/01/2023)  Utilities: Not At Risk (10/01/2023)  Alcohol Screen: Low Risk  (11/29/2022)  Depression (PHQ2-9): Low Risk  (06/06/2023)  Financial Resource Strain: Low Risk  (11/29/2022)  Physical Activity: Inactive (11/29/2022)  Social Connections: Moderately Isolated (11/29/2022)  Stress: No Stress Concern Present (11/29/2022)  Tobacco Use: Medium Risk (10/01/2023)     Readmission Risk Interventions     No data to display

## 2023-10-03 ENCOUNTER — Telehealth: Payer: Self-pay

## 2023-10-03 ENCOUNTER — Telehealth: Payer: Self-pay | Admitting: *Deleted

## 2023-10-03 DIAGNOSIS — I480 Paroxysmal atrial fibrillation: Secondary | ICD-10-CM | POA: Diagnosis not present

## 2023-10-03 DIAGNOSIS — U071 COVID-19: Secondary | ICD-10-CM | POA: Diagnosis not present

## 2023-10-03 DIAGNOSIS — E440 Moderate protein-calorie malnutrition: Secondary | ICD-10-CM | POA: Diagnosis not present

## 2023-10-03 DIAGNOSIS — I5032 Chronic diastolic (congestive) heart failure: Secondary | ICD-10-CM | POA: Diagnosis not present

## 2023-10-03 DIAGNOSIS — I2510 Atherosclerotic heart disease of native coronary artery without angina pectoris: Secondary | ICD-10-CM | POA: Diagnosis not present

## 2023-10-03 DIAGNOSIS — E8809 Other disorders of plasma-protein metabolism, not elsewhere classified: Secondary | ICD-10-CM | POA: Diagnosis not present

## 2023-10-03 DIAGNOSIS — E860 Dehydration: Secondary | ICD-10-CM | POA: Diagnosis not present

## 2023-10-03 DIAGNOSIS — G459 Transient cerebral ischemic attack, unspecified: Secondary | ICD-10-CM | POA: Diagnosis not present

## 2023-10-03 DIAGNOSIS — R627 Adult failure to thrive: Secondary | ICD-10-CM | POA: Diagnosis not present

## 2023-10-03 NOTE — Transitions of Care (Post Inpatient/ED Visit) (Signed)
   10/03/2023  Name: Bryan Cabrera MRN: [id number] DOB: Feb 22, 1941  Today's TOC FU Call Status: Today's TOC FU Call Status:: Unsuccessful Call (2nd Attempt) Unsuccessful Call (2nd Attempt) Date: 10/03/23  Attempted to reach the patient regarding the most recent Inpatient/ED visit. Patient was OBS status while in hospital setting.   Follow Up Plan: Additional outreach attempts will be made to reach the patient to complete the Transitions of Care (Post Inpatient/ED visit) call.   Gabriel Cirri MSN, RN [place]  [facility] Management Coordinator [email or web address] Direct Dial: [phone] Fax: [phone]

## 2023-10-03 NOTE — Transitions of Care (Post Inpatient/ED Visit) (Signed)
   10/03/2023  Name: Bryan Cabrera MRN: [id number] DOB: 1940/12/28  Today's TOC FU Call Status: Today's TOC FU Call Status:: Unsuccessful Call (1st Attempt) Unsuccessful Call (1st Attempt) Date: 10/03/23  Attempted to reach the patient regarding the most recent Inpatient/ED visit.  Follow Up Plan: Additional outreach attempts will be made to reach the patient to complete the Transitions of Care (Post Inpatient/ED visit) call.   Gean Maidens BSN RN Population Health- Transition of Care Team.  [facility] [phone]

## 2023-10-15 ENCOUNTER — Telehealth: Payer: Self-pay | Admitting: Internal Medicine

## 2023-10-15 DIAGNOSIS — G459 Transient cerebral ischemic attack, unspecified: Secondary | ICD-10-CM | POA: Diagnosis not present

## 2023-10-15 DIAGNOSIS — E440 Moderate protein-calorie malnutrition: Secondary | ICD-10-CM | POA: Diagnosis not present

## 2023-10-15 DIAGNOSIS — U071 COVID-19: Secondary | ICD-10-CM | POA: Diagnosis not present

## 2023-10-15 DIAGNOSIS — I2510 Atherosclerotic heart disease of native coronary artery without angina pectoris: Secondary | ICD-10-CM | POA: Diagnosis not present

## 2023-10-15 DIAGNOSIS — I480 Paroxysmal atrial fibrillation: Secondary | ICD-10-CM | POA: Diagnosis not present

## 2023-10-15 DIAGNOSIS — R627 Adult failure to thrive: Secondary | ICD-10-CM | POA: Diagnosis not present

## 2023-10-15 DIAGNOSIS — E8809 Other disorders of plasma-protein metabolism, not elsewhere classified: Secondary | ICD-10-CM | POA: Diagnosis not present

## 2023-10-15 DIAGNOSIS — E860 Dehydration: Secondary | ICD-10-CM | POA: Diagnosis not present

## 2023-10-15 DIAGNOSIS — I5032 Chronic diastolic (congestive) heart failure: Secondary | ICD-10-CM | POA: Diagnosis not present

## 2023-10-15 NOTE — Telephone Encounter (Signed)
M[place]ory OT with [facility] for 1x2

## 2023-10-19 NOTE — Telephone Encounter (Signed)
 Copied from CRM [id number]. Topic: Clinical - Home Health Verbal Orders >> Oct 19, 2023  2:44 PM Taleah C wrote: Caller/Agency: [facility], sounded like the name was Valorie Callback Number: [phone] Service Requested: Occupational Therapy She called back again, to follow-up on message that was sent 12/30. She has not received orders yet. Please cb and advise.

## 2023-10-21 NOTE — Telephone Encounter (Signed)
 Ok for orders   he has appt  this week.   Remind that I am only in office 3 days per week and  any emergency order should be handled  by other in team

## 2023-10-22 ENCOUNTER — Telehealth: Payer: Self-pay
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
## 2023-10-22 NOTE — Telephone Encounter (Signed)
 Copied from CRM [id number]. Topic: Clinical - Home Health Verbal Orders >> Oct 22, 2023 11:31 AM Leila C wrote: Caller/Agency: [facility] therapist [phone] states needs a verbal order for OT, this is the fourth time calling. Please call back.

## 2023-10-23 ENCOUNTER — Ambulatory Visit: Payer: Medicare PPO | Admitting: Internal Medicine

## 2023-10-23 ENCOUNTER — Encounter: Payer: Self-pay | Admitting: Internal Medicine

## 2023-10-23 VITALS — BP 130/70 | HR 91 | Temp 97.8°F | Ht 68.0 in | Wt 198.6 lb

## 2023-10-23 DIAGNOSIS — I2510 Atherosclerotic heart disease of native coronary artery without angina pectoris: Secondary | ICD-10-CM

## 2023-10-23 DIAGNOSIS — Z09 Encounter for follow-up examination after completed treatment for conditions other than malignant neoplasm: Secondary | ICD-10-CM

## 2023-10-23 DIAGNOSIS — Z23 Encounter for immunization: Secondary | ICD-10-CM

## 2023-10-23 DIAGNOSIS — Z8673 Personal history of transient ischemic attack (TIA), and cerebral infarction without residual deficits: Secondary | ICD-10-CM | POA: Diagnosis not present

## 2023-10-23 DIAGNOSIS — Z955 Presence of coronary angioplasty implant and graft: Secondary | ICD-10-CM

## 2023-10-23 DIAGNOSIS — R053 Chronic cough: Secondary | ICD-10-CM | POA: Diagnosis not present

## 2023-10-23 DIAGNOSIS — R4781 Slurred speech: Secondary | ICD-10-CM

## 2023-10-23 DIAGNOSIS — Z7901 Long term (current) use of anticoagulants: Secondary | ICD-10-CM

## 2023-10-23 DIAGNOSIS — Z79899 Other long term (current) drug therapy: Secondary | ICD-10-CM | POA: Diagnosis not present

## 2023-10-23 DIAGNOSIS — Z8616 Personal history of COVID-19: Secondary | ICD-10-CM

## 2023-10-23 NOTE — Patient Instructions (Addendum)
 Good to see you today  Agree with  home health especially for speech therapy.   ( OTPT)   Keep appt next month and fu.  I think the cough is left over  from covid  infection  and will take a while   Lung exam is clear today .

## 2023-10-24 ENCOUNTER — Telehealth: Payer: Self-pay

## 2023-10-24 DIAGNOSIS — U071 COVID-19: Secondary | ICD-10-CM | POA: Diagnosis not present

## 2023-10-24 DIAGNOSIS — I5032 Chronic diastolic (congestive) heart failure: Secondary | ICD-10-CM | POA: Diagnosis not present

## 2023-10-24 DIAGNOSIS — G459 Transient cerebral ischemic attack, unspecified: Secondary | ICD-10-CM | POA: Diagnosis not present

## 2023-10-24 DIAGNOSIS — R627 Adult failure to thrive: Secondary | ICD-10-CM | POA: Diagnosis not present

## 2023-10-24 DIAGNOSIS — E860 Dehydration: Secondary | ICD-10-CM | POA: Diagnosis not present

## 2023-10-24 DIAGNOSIS — I480 Paroxysmal atrial fibrillation: Secondary | ICD-10-CM | POA: Diagnosis not present

## 2023-10-24 DIAGNOSIS — E8809 Other disorders of plasma-protein metabolism, not elsewhere classified: Secondary | ICD-10-CM | POA: Diagnosis not present

## 2023-10-24 DIAGNOSIS — I2510 Atherosclerotic heart disease of native coronary artery without angina pectoris: Secondary | ICD-10-CM | POA: Diagnosis not present

## 2023-10-24 DIAGNOSIS — E440 Moderate protein-calorie malnutrition: Secondary | ICD-10-CM | POA: Diagnosis not present

## 2023-10-24 NOTE — Telephone Encounter (Signed)
 Attempted to reach [facility]. Left a voicemail to call us back.

## 2023-10-24 NOTE — Telephone Encounter (Signed)
 Copied from CRM [id number]. Topic: Clinical - Home Health Verbal Orders >> Oct 24, 2023  4:32 PM Corin V wrote: Caller/Agency: [facility] Callback Number: [phone] Service Requested: Speech Therapy Frequency: 1 time per week for 4 weeks Any new concerns about the patient? No

## 2023-10-24 NOTE — Telephone Encounter (Signed)
 Attempted to reach pt. Left a voicemail.

## 2023-10-25 NOTE — Telephone Encounter (Signed)
 Ok approve  order for speech therapy

## 2023-10-25 NOTE — Telephone Encounter (Signed)
 Attempted to reach [facility] with [facility]. Left a voicemail.

## 2023-10-25 NOTE — Telephone Encounter (Signed)
Entered chart by accident

## 2023-10-25 NOTE — Telephone Encounter (Signed)
 Contacted Myrtie Neither and inform her the approve VO from provider. Verbalized understanding.

## 2023-10-26 NOTE — Telephone Encounter (Signed)
 Spoke to [employer].   Gave her Ok for VO per Dr. Fabian Sharp. She states they will start the order next week since they just received the approval today.

## 2023-10-30 DIAGNOSIS — U071 COVID-19: Secondary | ICD-10-CM | POA: Diagnosis not present

## 2023-10-30 DIAGNOSIS — E8809 Other disorders of plasma-protein metabolism, not elsewhere classified: Secondary | ICD-10-CM | POA: Diagnosis not present

## 2023-10-30 DIAGNOSIS — H9190 Unspecified hearing loss, unspecified ear: Secondary | ICD-10-CM

## 2023-10-30 DIAGNOSIS — G459 Transient cerebral ischemic attack, unspecified: Secondary | ICD-10-CM | POA: Diagnosis not present

## 2023-10-30 DIAGNOSIS — H269 Unspecified cataract: Secondary | ICD-10-CM

## 2023-10-30 DIAGNOSIS — E440 Moderate protein-calorie malnutrition: Secondary | ICD-10-CM | POA: Diagnosis not present

## 2023-10-30 DIAGNOSIS — N529 Male erectile dysfunction, unspecified: Secondary | ICD-10-CM

## 2023-10-30 DIAGNOSIS — I5032 Chronic diastolic (congestive) heart failure: Secondary | ICD-10-CM | POA: Diagnosis not present

## 2023-10-30 DIAGNOSIS — E860 Dehydration: Secondary | ICD-10-CM | POA: Diagnosis not present

## 2023-10-30 DIAGNOSIS — R627 Adult failure to thrive: Secondary | ICD-10-CM | POA: Diagnosis not present

## 2023-10-30 DIAGNOSIS — I480 Paroxysmal atrial fibrillation: Secondary | ICD-10-CM | POA: Diagnosis not present

## 2023-10-30 DIAGNOSIS — I2510 Atherosclerotic heart disease of native coronary artery without angina pectoris: Secondary | ICD-10-CM | POA: Diagnosis not present

## 2023-10-31 ENCOUNTER — Telehealth: Payer: Self-pay

## 2023-10-31 DIAGNOSIS — U071 COVID-19: Secondary | ICD-10-CM | POA: Diagnosis not present

## 2023-10-31 DIAGNOSIS — I2510 Atherosclerotic heart disease of native coronary artery without angina pectoris: Secondary | ICD-10-CM | POA: Diagnosis not present

## 2023-10-31 DIAGNOSIS — R627 Adult failure to thrive: Secondary | ICD-10-CM | POA: Diagnosis not present

## 2023-10-31 DIAGNOSIS — E8809 Other disorders of plasma-protein metabolism, not elsewhere classified: Secondary | ICD-10-CM | POA: Diagnosis not present

## 2023-10-31 DIAGNOSIS — G459 Transient cerebral ischemic attack, unspecified: Secondary | ICD-10-CM | POA: Diagnosis not present

## 2023-10-31 DIAGNOSIS — I480 Paroxysmal atrial fibrillation: Secondary | ICD-10-CM | POA: Diagnosis not present

## 2023-10-31 DIAGNOSIS — E440 Moderate protein-calorie malnutrition: Secondary | ICD-10-CM | POA: Diagnosis not present

## 2023-10-31 DIAGNOSIS — I5032 Chronic diastolic (congestive) heart failure: Secondary | ICD-10-CM | POA: Diagnosis not present

## 2023-10-31 DIAGNOSIS — E860 Dehydration: Secondary | ICD-10-CM | POA: Diagnosis not present

## 2023-10-31 NOTE — Telephone Encounter (Signed)
Yes. agree

## 2023-10-31 NOTE — Telephone Encounter (Signed)
 Copied from CRM [id number]. Topic: Clinical - Home Health Verbal Orders >> Oct 31, 2023 10:13 AM Crist Dominion wrote: Reason for CRM: Malorie from [facility] states patient has declined in last 48 hours and is requesting a verbal order for an extension to the occupational therapy 1 time a week for 4 weeks - Please call [phone]

## 2023-11-01 NOTE — Telephone Encounter (Signed)
I think he should have a visit  if not getting better or persistent fever .

## 2023-11-01 NOTE — Telephone Encounter (Signed)
Contacted pt to follow up.  Pt reports he had a rough night last night and that he was vomiting for " good while". EMS had came and told him he has virus and that he does not needs to go to ER as they are pack with people. And that it would be better for him stay.   Pt states his sx started two days ago. Sx cold chills, vomiting (one episode last night), low grade fever (last night), some cough, bodyache (had some before), no headache, weakness and dizzyness.   Pt reports he is taking Tylenol. Offer pt a video visit with provider today. Pt states he doesn't know for right now. Advise him to give [place] call when he wants to be seen. Advise pt to monitor his sx, stay hydrated, stay warm by drink warm beverage and soup.   Forwarding to provider.

## 2023-11-01 NOTE — Telephone Encounter (Signed)
Spoke to [employer]. Inform her the approval for Verbal Order. She states the reason for the extension is because pt is not feeling well. She states pt was running a low grade fever, chills and weaker.

## 2023-11-02 ENCOUNTER — Ambulatory Visit: Payer: Medicare PPO | Admitting: Physician Assistant

## 2023-11-02 DIAGNOSIS — I2510 Atherosclerotic heart disease of native coronary artery without angina pectoris: Secondary | ICD-10-CM | POA: Diagnosis not present

## 2023-11-02 DIAGNOSIS — I480 Paroxysmal atrial fibrillation: Secondary | ICD-10-CM | POA: Diagnosis not present

## 2023-11-02 DIAGNOSIS — E440 Moderate protein-calorie malnutrition: Secondary | ICD-10-CM | POA: Diagnosis not present

## 2023-11-02 DIAGNOSIS — U071 COVID-19: Secondary | ICD-10-CM | POA: Diagnosis not present

## 2023-11-02 DIAGNOSIS — R627 Adult failure to thrive: Secondary | ICD-10-CM | POA: Diagnosis not present

## 2023-11-02 DIAGNOSIS — E8809 Other disorders of plasma-protein metabolism, not elsewhere classified: Secondary | ICD-10-CM | POA: Diagnosis not present

## 2023-11-02 DIAGNOSIS — G459 Transient cerebral ischemic attack, unspecified: Secondary | ICD-10-CM | POA: Diagnosis not present

## 2023-11-02 DIAGNOSIS — E860 Dehydration: Secondary | ICD-10-CM | POA: Diagnosis not present

## 2023-11-02 DIAGNOSIS — I5032 Chronic diastolic (congestive) heart failure: Secondary | ICD-10-CM | POA: Diagnosis not present

## 2023-11-02 NOTE — Telephone Encounter (Signed)
Spoke to pt.   Pt updates his chills has gotten better. Still having some body ache occasionally cough and urge to cough and abdominal pain from severe vomiting episode from Wednesday night. Pt denied fever, headache and diarrhea.   There is no availability slot at this office today. Inform pt, he can do e-visit or go to urgent care.   Pt states he will wait and monitor his symptoms and if not feeling better on the weekend. He will proceed to an urgent care.     Forwarding to provider for fyi.

## 2023-11-07 DIAGNOSIS — I2510 Atherosclerotic heart disease of native coronary artery without angina pectoris: Secondary | ICD-10-CM | POA: Diagnosis not present

## 2023-11-07 DIAGNOSIS — E440 Moderate protein-calorie malnutrition: Secondary | ICD-10-CM | POA: Diagnosis not present

## 2023-11-07 DIAGNOSIS — I480 Paroxysmal atrial fibrillation: Secondary | ICD-10-CM | POA: Diagnosis not present

## 2023-11-07 DIAGNOSIS — U071 COVID-19: Secondary | ICD-10-CM | POA: Diagnosis not present

## 2023-11-07 DIAGNOSIS — I5032 Chronic diastolic (congestive) heart failure: Secondary | ICD-10-CM | POA: Diagnosis not present

## 2023-11-07 DIAGNOSIS — E8809 Other disorders of plasma-protein metabolism, not elsewhere classified: Secondary | ICD-10-CM | POA: Diagnosis not present

## 2023-11-07 DIAGNOSIS — E860 Dehydration: Secondary | ICD-10-CM | POA: Diagnosis not present

## 2023-11-07 DIAGNOSIS — R627 Adult failure to thrive: Secondary | ICD-10-CM | POA: Diagnosis not present

## 2023-11-07 DIAGNOSIS — G459 Transient cerebral ischemic attack, unspecified: Secondary | ICD-10-CM | POA: Diagnosis not present

## 2023-11-08 DIAGNOSIS — E8809 Other disorders of plasma-protein metabolism, not elsewhere classified: Secondary | ICD-10-CM | POA: Diagnosis not present

## 2023-11-08 DIAGNOSIS — G459 Transient cerebral ischemic attack, unspecified: Secondary | ICD-10-CM | POA: Diagnosis not present

## 2023-11-08 DIAGNOSIS — I2510 Atherosclerotic heart disease of native coronary artery without angina pectoris: Secondary | ICD-10-CM | POA: Diagnosis not present

## 2023-11-08 DIAGNOSIS — U071 COVID-19: Secondary | ICD-10-CM | POA: Diagnosis not present

## 2023-11-08 DIAGNOSIS — I5032 Chronic diastolic (congestive) heart failure: Secondary | ICD-10-CM | POA: Diagnosis not present

## 2023-11-08 DIAGNOSIS — R627 Adult failure to thrive: Secondary | ICD-10-CM | POA: Diagnosis not present

## 2023-11-08 DIAGNOSIS — E860 Dehydration: Secondary | ICD-10-CM | POA: Diagnosis not present

## 2023-11-08 DIAGNOSIS — E440 Moderate protein-calorie malnutrition: Secondary | ICD-10-CM | POA: Diagnosis not present

## 2023-11-08 DIAGNOSIS — I480 Paroxysmal atrial fibrillation: Secondary | ICD-10-CM | POA: Diagnosis not present

## 2023-11-09 DIAGNOSIS — R627 Adult failure to thrive: Secondary | ICD-10-CM | POA: Diagnosis not present

## 2023-11-09 DIAGNOSIS — E440 Moderate protein-calorie malnutrition: Secondary | ICD-10-CM | POA: Diagnosis not present

## 2023-11-09 DIAGNOSIS — G459 Transient cerebral ischemic attack, unspecified: Secondary | ICD-10-CM | POA: Diagnosis not present

## 2023-11-09 DIAGNOSIS — I480 Paroxysmal atrial fibrillation: Secondary | ICD-10-CM | POA: Diagnosis not present

## 2023-11-09 DIAGNOSIS — I5032 Chronic diastolic (congestive) heart failure: Secondary | ICD-10-CM | POA: Diagnosis not present

## 2023-11-09 DIAGNOSIS — E860 Dehydration: Secondary | ICD-10-CM | POA: Diagnosis not present

## 2023-11-09 DIAGNOSIS — U071 COVID-19: Secondary | ICD-10-CM | POA: Diagnosis not present

## 2023-11-09 DIAGNOSIS — I2510 Atherosclerotic heart disease of native coronary artery without angina pectoris: Secondary | ICD-10-CM | POA: Diagnosis not present

## 2023-11-09 DIAGNOSIS — E8809 Other disorders of plasma-protein metabolism, not elsewhere classified: Secondary | ICD-10-CM | POA: Diagnosis not present

## 2023-11-13 DIAGNOSIS — U071 COVID-19: Secondary | ICD-10-CM | POA: Diagnosis not present

## 2023-11-13 DIAGNOSIS — G459 Transient cerebral ischemic attack, unspecified: Secondary | ICD-10-CM | POA: Diagnosis not present

## 2023-11-13 DIAGNOSIS — I5032 Chronic diastolic (congestive) heart failure: Secondary | ICD-10-CM | POA: Diagnosis not present

## 2023-11-13 DIAGNOSIS — E860 Dehydration: Secondary | ICD-10-CM | POA: Diagnosis not present

## 2023-11-13 DIAGNOSIS — I2510 Atherosclerotic heart disease of native coronary artery without angina pectoris: Secondary | ICD-10-CM | POA: Diagnosis not present

## 2023-11-13 DIAGNOSIS — E440 Moderate protein-calorie malnutrition: Secondary | ICD-10-CM | POA: Diagnosis not present

## 2023-11-13 DIAGNOSIS — R627 Adult failure to thrive: Secondary | ICD-10-CM | POA: Diagnosis not present

## 2023-11-13 DIAGNOSIS — E8809 Other disorders of plasma-protein metabolism, not elsewhere classified: Secondary | ICD-10-CM | POA: Diagnosis not present

## 2023-11-13 DIAGNOSIS — I480 Paroxysmal atrial fibrillation: Secondary | ICD-10-CM | POA: Diagnosis not present

## 2023-11-14 DIAGNOSIS — E8809 Other disorders of plasma-protein metabolism, not elsewhere classified: Secondary | ICD-10-CM | POA: Diagnosis not present

## 2023-11-14 DIAGNOSIS — G459 Transient cerebral ischemic attack, unspecified: Secondary | ICD-10-CM | POA: Diagnosis not present

## 2023-11-14 DIAGNOSIS — R627 Adult failure to thrive: Secondary | ICD-10-CM | POA: Diagnosis not present

## 2023-11-14 DIAGNOSIS — E440 Moderate protein-calorie malnutrition: Secondary | ICD-10-CM | POA: Diagnosis not present

## 2023-11-14 DIAGNOSIS — I480 Paroxysmal atrial fibrillation: Secondary | ICD-10-CM | POA: Diagnosis not present

## 2023-11-14 DIAGNOSIS — I2510 Atherosclerotic heart disease of native coronary artery without angina pectoris: Secondary | ICD-10-CM | POA: Diagnosis not present

## 2023-11-14 DIAGNOSIS — E860 Dehydration: Secondary | ICD-10-CM | POA: Diagnosis not present

## 2023-11-14 DIAGNOSIS — I5032 Chronic diastolic (congestive) heart failure: Secondary | ICD-10-CM | POA: Diagnosis not present

## 2023-11-14 DIAGNOSIS — U071 COVID-19: Secondary | ICD-10-CM | POA: Diagnosis not present

## 2023-11-16 DIAGNOSIS — E440 Moderate protein-calorie malnutrition: Secondary | ICD-10-CM | POA: Diagnosis not present

## 2023-11-16 DIAGNOSIS — I5032 Chronic diastolic (congestive) heart failure: Secondary | ICD-10-CM | POA: Diagnosis not present

## 2023-11-16 DIAGNOSIS — R627 Adult failure to thrive: Secondary | ICD-10-CM | POA: Diagnosis not present

## 2023-11-16 DIAGNOSIS — E8809 Other disorders of plasma-protein metabolism, not elsewhere classified: Secondary | ICD-10-CM | POA: Diagnosis not present

## 2023-11-16 DIAGNOSIS — U071 COVID-19: Secondary | ICD-10-CM | POA: Diagnosis not present

## 2023-11-16 DIAGNOSIS — E860 Dehydration: Secondary | ICD-10-CM | POA: Diagnosis not present

## 2023-11-16 DIAGNOSIS — G459 Transient cerebral ischemic attack, unspecified: Secondary | ICD-10-CM | POA: Diagnosis not present

## 2023-11-16 DIAGNOSIS — I2510 Atherosclerotic heart disease of native coronary artery without angina pectoris: Secondary | ICD-10-CM | POA: Diagnosis not present

## 2023-11-16 DIAGNOSIS — I480 Paroxysmal atrial fibrillation: Secondary | ICD-10-CM | POA: Diagnosis not present

## 2023-11-22 DIAGNOSIS — I2510 Atherosclerotic heart disease of native coronary artery without angina pectoris: Secondary | ICD-10-CM | POA: Diagnosis not present

## 2023-11-22 DIAGNOSIS — E8809 Other disorders of plasma-protein metabolism, not elsewhere classified: Secondary | ICD-10-CM | POA: Diagnosis not present

## 2023-11-22 DIAGNOSIS — I5032 Chronic diastolic (congestive) heart failure: Secondary | ICD-10-CM | POA: Diagnosis not present

## 2023-11-22 DIAGNOSIS — E860 Dehydration: Secondary | ICD-10-CM | POA: Diagnosis not present

## 2023-11-22 DIAGNOSIS — I480 Paroxysmal atrial fibrillation: Secondary | ICD-10-CM | POA: Diagnosis not present

## 2023-11-22 DIAGNOSIS — U071 COVID-19: Secondary | ICD-10-CM | POA: Diagnosis not present

## 2023-11-22 DIAGNOSIS — E440 Moderate protein-calorie malnutrition: Secondary | ICD-10-CM | POA: Diagnosis not present

## 2023-11-22 DIAGNOSIS — R627 Adult failure to thrive: Secondary | ICD-10-CM | POA: Diagnosis not present

## 2023-11-22 DIAGNOSIS — G459 Transient cerebral ischemic attack, unspecified: Secondary | ICD-10-CM | POA: Diagnosis not present

## 2023-11-26 DIAGNOSIS — E860 Dehydration: Secondary | ICD-10-CM | POA: Diagnosis not present

## 2023-11-26 DIAGNOSIS — I480 Paroxysmal atrial fibrillation: Secondary | ICD-10-CM | POA: Diagnosis not present

## 2023-11-26 DIAGNOSIS — I5032 Chronic diastolic (congestive) heart failure: Secondary | ICD-10-CM | POA: Diagnosis not present

## 2023-11-26 DIAGNOSIS — E8809 Other disorders of plasma-protein metabolism, not elsewhere classified: Secondary | ICD-10-CM | POA: Diagnosis not present

## 2023-11-26 DIAGNOSIS — E440 Moderate protein-calorie malnutrition: Secondary | ICD-10-CM | POA: Diagnosis not present

## 2023-11-26 DIAGNOSIS — I2510 Atherosclerotic heart disease of native coronary artery without angina pectoris: Secondary | ICD-10-CM | POA: Diagnosis not present

## 2023-11-26 DIAGNOSIS — R627 Adult failure to thrive: Secondary | ICD-10-CM | POA: Diagnosis not present

## 2023-11-26 DIAGNOSIS — G459 Transient cerebral ischemic attack, unspecified: Secondary | ICD-10-CM | POA: Diagnosis not present

## 2023-11-26 DIAGNOSIS — U071 COVID-19: Secondary | ICD-10-CM | POA: Diagnosis not present

## 2023-11-29 ENCOUNTER — Ambulatory Visit: Payer: Medicare PPO | Admitting: Internal Medicine

## 2023-11-29 ENCOUNTER — Encounter: Payer: Self-pay | Admitting: Internal Medicine

## 2023-11-29 ENCOUNTER — Other Ambulatory Visit: Payer: Self-pay | Admitting: Cardiology

## 2023-11-29 VITALS — BP 136/80 | HR 80 | Temp 97.9°F | Ht 68.0 in | Wt 196.0 lb

## 2023-11-29 DIAGNOSIS — Z8673 Personal history of transient ischemic attack (TIA), and cerebral infarction without residual deficits: Secondary | ICD-10-CM | POA: Diagnosis not present

## 2023-11-29 DIAGNOSIS — I10 Essential (primary) hypertension: Secondary | ICD-10-CM | POA: Diagnosis not present

## 2023-11-29 DIAGNOSIS — Z955 Presence of coronary angioplasty implant and graft: Secondary | ICD-10-CM | POA: Diagnosis not present

## 2023-11-29 DIAGNOSIS — Z9181 History of falling: Secondary | ICD-10-CM | POA: Diagnosis not present

## 2023-11-29 DIAGNOSIS — Z79899 Other long term (current) drug therapy: Secondary | ICD-10-CM | POA: Diagnosis not present

## 2023-11-29 DIAGNOSIS — Z23 Encounter for immunization: Secondary | ICD-10-CM | POA: Diagnosis not present

## 2023-11-29 DIAGNOSIS — Z7901 Long term (current) use of anticoagulants: Secondary | ICD-10-CM

## 2023-11-29 NOTE — Patient Instructions (Addendum)
Good to see you today . Prevnar 20 vaccine  ( for pneumococcal bacterial infection prevention)   Advise fu in 4 months and we can do any labs needed at that time   BP is  in range today.   Rest as per Cardiology  advice .
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
## 2023-11-30 DIAGNOSIS — G459 Transient cerebral ischemic attack, unspecified: Secondary | ICD-10-CM | POA: Diagnosis not present

## 2023-11-30 DIAGNOSIS — E440 Moderate protein-calorie malnutrition: Secondary | ICD-10-CM | POA: Diagnosis not present

## 2023-11-30 DIAGNOSIS — E8809 Other disorders of plasma-protein metabolism, not elsewhere classified: Secondary | ICD-10-CM | POA: Diagnosis not present

## 2023-11-30 DIAGNOSIS — I5032 Chronic diastolic (congestive) heart failure: Secondary | ICD-10-CM | POA: Diagnosis not present

## 2023-11-30 DIAGNOSIS — I2510 Atherosclerotic heart disease of native coronary artery without angina pectoris: Secondary | ICD-10-CM | POA: Diagnosis not present

## 2023-11-30 DIAGNOSIS — R627 Adult failure to thrive: Secondary | ICD-10-CM | POA: Diagnosis not present

## 2023-11-30 DIAGNOSIS — I480 Paroxysmal atrial fibrillation: Secondary | ICD-10-CM | POA: Diagnosis not present

## 2023-11-30 DIAGNOSIS — U071 COVID-19: Secondary | ICD-10-CM | POA: Diagnosis not present

## 2023-11-30 DIAGNOSIS — E860 Dehydration: Secondary | ICD-10-CM | POA: Diagnosis not present

## 2023-12-10 NOTE — Progress Notes (Unsigned)
 Cardiology Office Note    Date:  12/12/2023  ID:  Eriq, Hufford [birth year], MRN [id number] PCP:  [name], MD  Cardiologist:  Donato Schultz, MD  Electrophysiologist:  Lanier Prude, MD   Chief Complaint: overdue follow-up afib, CAD  History of Present Illness: .    Bryan Cabrera is a 83 y.o. male with visit-pertinent history of CAD s/p DES to RCA 11/2021, persistent atrial fibrillation, bradycardia, HTN, hip hematoma, vertigo, hypertriglyceridemia, TIA, NSVT, PVCs seen for overdue follow-up. Last seen by Dr. Anne Fu 08/2022.   PAF was diagnosed in 2019. There has been back and forth with his anticoagulation, felt somewhat reluctant due to hx of falls though today the patient adamantly states he has not fallen except he had a hip hematoma in 2022 on Eliquis with balance-related fall so this was not restarted. He saw Dr. Lalla Brothers 10/2021 with consideration of watchman but had subsequent tumultuous course interrupting workup. In 11/2021 he was admitted with [place] and had DES to [facility] started on DAPT. He was then admitted 12/2021 with TIA with plan for DAPT x30 days then change ASA to Eliquis. He had bradycardia in 05/2022 requiring DC of metoprolol + diltiazem. Monitor 07/2022 100% AF avg HR 79bpm, occ 1.2% PVCs, 8 NSVT (longest 5 beats). He was last seen 08/2022, Watchman not revisited, recommended to d/c Plavix after 1 year and continue with Eliquis. He was admitted twice in 09/2023 - once with Covid in which several meds held due to Paxlovid rx (including Eliquis) then readmitted a few days after discharge with dysarthria, stroke ruled out, SNF recommended for adult failure to thrive but patient declined; OP palliative med consult recommended. Carotid US with minimal plaque. 2D echo showed EF 65-70%, moderate LVH.  He returns for follow-up doing well from cardiac standpoint without recent CP, SOB, palpitations or falls. He is slowly getting his strength back and back to being out and about  for appointments and such. BP running elevated today - he thinks this is related to eating BBQ right before today's visit with lots of sauce. Has a home BP cuff he can follow with. His amlodipine (low dose) was stopped during his 09/2023 hospital stay. Recheck 140/80 by me.  Labwork independently reviewed: 09/2023 Mg Ok, Hgb 15.8, plt 221, K 3.6, alb 2.8, Cr 1.00, AST ALT OK, LDL 38, trig 142 2022 TSH OK  ROS: .    Please see the history of present illness.  All other systems are reviewed and otherwise negative.  Studies Reviewed: Marland Kitchen    EKG:  EKG is not ordered today but reviewed 09/30/23 - atrial fib 74bpm, low voltage nonspecific STTW changes  CV Studies: Cardiac studies reviewed are outlined and summarized above. Otherwise please see EMR for full report.   Current Reported Medications:.    Current Meds  Medication Sig   apixaban (ELIQUIS) 5 MG TABS tablet Take 1 tablet (5 mg total) by mouth 2 (two) times daily.   isosorbide mononitrate (IMDUR) 30 MG 24 hr tablet TAKE ONE TABLET (30MG  TOTAL) BY MOUTH DAILY   latanoprost (XALATAN) 0.005 % ophthalmic solution Place 1 drop into both eyes at bedtime.   nitroGLYCERIN (NITROSTAT) 0.4 MG SL tablet PLACE ONE TONGUE (0.4 MG TOTAL) UNDER THE TONGUE EVERY 5 MINUTES AS NEEDED FORCHEST PAIN.   olmesartan (BENICAR) 40 MG tablet Take 40 mg by mouth daily.   rosuvastatin (CRESTOR) 20 MG tablet TAKE ONE (1) TABLET BY MOUTH EVERY DAY    Physical Exam:  VS:  BP (!) 140/80   Pulse 84   Ht 5\' 8"  (1.727 m)   Wt 197 lb (89.4 kg)   SpO2 98%   BMI 29.95 kg/m    Wt Readings from Last 3 Encounters:  12/12/23 197 lb (89.4 kg)  11/29/23 196 lb (88.9 kg)  10/23/23 198 lb 9.6 oz (90.1 kg)    GEN: Well nourished, well developed in no acute distress NECK: No JVD; No carotid bruits CARDIAC: RRR, no murmurs, rubs, gallops RESPIRATORY:  Clear to auscultation without rales, wheezing or rhonchi  ABDOMEN: Soft, non-tender, non-distended EXTREMITIES:  No  edema; No acute deformity   Asessement and Plan:.    1. Persistent AF with prior history of bradycardia as well - remains in atrial fib with adequate rate control off AVN blocking therapy. In absence of any new symptoms, no med changes made today. Continue Eliquis 5mg  BID, dose appropriate.  2. CAD - asymptomatic. No longer on Plavix per Dr. Minerva Fester instructions. Not on ASA due to concomitant Eliquis. Continue rosuvastatin.  3. HTN - Suboptimal blood pressure control noted today. He has seen intermittently elevated values at home as well. We need a better idea of what it's running at home. If need be, would be reasonable to restart amlodipine at 2.5mg  dose. I discussed this with the patient today and he prefers not to start at this time. The patient was provided instructions on monitoring blood pressure at home for 1 week and relaying results to his PCP who has historically managed this in the past. With his history of vertigo, reasonable to be a little looser with BP goal of <140/80. Also has f/u with PCP in June.  4. NSVT/PVCs - quiescent by recent EKG. Hold off further testing at this time and follow for recurrent symptoms.   HYPERTENSION CONTROL Vitals:   12/12/23 1400 12/12/23 1443  BP: (!) 168/78 (!) 140/80    The patient's blood pressure is elevated above target today.  In order to address the patient's elevated BP: Blood pressure will be monitored at home to determine if medication changes need to be made.        Disposition: F/u with Dr. Anne Fu in 6 months.  Signed, Laurann Montana, [name]

## 2023-12-12 ENCOUNTER — Encounter: Payer: Self-pay | Admitting: Physician Assistant

## 2023-12-12 ENCOUNTER — Ambulatory Visit: Payer: Medicare PPO | Attending: Physician Assistant | Admitting: Physician Assistant

## 2023-12-12 VITALS — BP 140/80 | HR 84 | Ht 68.0 in | Wt 197.0 lb

## 2023-12-12 DIAGNOSIS — R001 Bradycardia, unspecified: Secondary | ICD-10-CM

## 2023-12-12 DIAGNOSIS — I10 Essential (primary) hypertension: Secondary | ICD-10-CM

## 2023-12-12 DIAGNOSIS — I4819 Other persistent atrial fibrillation: Secondary | ICD-10-CM | POA: Diagnosis not present

## 2023-12-12 DIAGNOSIS — I4729 Other ventricular tachycardia: Secondary | ICD-10-CM | POA: Diagnosis not present

## 2023-12-12 DIAGNOSIS — I493 Ventricular premature depolarization: Secondary | ICD-10-CM

## 2023-12-12 DIAGNOSIS — I2510 Atherosclerotic heart disease of native coronary artery without angina pectoris: Secondary | ICD-10-CM

## 2023-12-12 NOTE — Patient Instructions (Addendum)
 We need to get a better idea of what your blood pressure is running at home. Here are some instructions to follow: - I would recommend using a blood pressure cuff that goes on your arm. The wrist ones can be inaccurate. If you're purchasing one for the first time, try to select one that also reports your heart rate because this can be helpful information as well. - To check your blood pressure, choose a time at least 3 hours after taking your blood pressure medicines. If you can sample it at different times of the day, that's great - it might give you more information about how your blood pressure fluctuates. Remain seated in a chair for 5 minutes quietly beforehand, then check it.  - Please record a list of those readings and bring log to your visit with primary care and next cardiology visit. - Please call your primary care provider if you are noting that your blood pressure is tending to run 140 on the top number or higher regularly.   Medication Instructions:  Your physician recommends that you continue on your current medications as directed. Please refer to the Current Medication list given to you today.   Labwork: None today  Testing/Procedures: None today  Follow-Up: 6 months with Dr.Skains  Any Other Special Instructions Will Be Listed Below (If Applicable).  If you need a refill on your cardiac medications before your next appointment, please call your pharmacy.

## 2023-12-24 ENCOUNTER — Other Ambulatory Visit: Payer: Self-pay | Admitting: Cardiology

## 2024-01-01 ENCOUNTER — Emergency Department (HOSPITAL_COMMUNITY)

## 2024-01-01 ENCOUNTER — Encounter (HOSPITAL_COMMUNITY): Payer: Self-pay | Admitting: Emergency Medicine

## 2024-01-01 ENCOUNTER — Other Ambulatory Visit: Payer: Self-pay

## 2024-01-01 ENCOUNTER — Inpatient Hospital Stay (HOSPITAL_COMMUNITY)
Admission: EM | Admit: 2024-01-01 | Discharge: 2024-01-05 | DRG: 872 | Disposition: A | Discharge status: Home Health | Attending: Internal Medicine | Admitting: Internal Medicine

## 2024-01-01 DIAGNOSIS — Z880 Allergy status to penicillin: Secondary | ICD-10-CM

## 2024-01-01 DIAGNOSIS — Z888 Allergy status to other drugs, medicaments and biological substances status: Secondary | ICD-10-CM

## 2024-01-01 DIAGNOSIS — Z882 Allergy status to sulfonamides status: Secondary | ICD-10-CM

## 2024-01-01 DIAGNOSIS — E876 Hypokalemia: Secondary | ICD-10-CM | POA: Diagnosis not present

## 2024-01-01 DIAGNOSIS — K5732 Diverticulitis of large intestine without perforation or abscess without bleeding: Secondary | ICD-10-CM | POA: Diagnosis present

## 2024-01-01 DIAGNOSIS — I482 Chronic atrial fibrillation, unspecified: Secondary | ICD-10-CM | POA: Diagnosis not present

## 2024-01-01 DIAGNOSIS — Z1152 Encounter for screening for COVID-19: Secondary | ICD-10-CM | POA: Diagnosis not present

## 2024-01-01 DIAGNOSIS — Z8249 Family history of ischemic heart disease and other diseases of the circulatory system: Secondary | ICD-10-CM

## 2024-01-01 DIAGNOSIS — Z823 Family history of stroke: Secondary | ICD-10-CM

## 2024-01-01 DIAGNOSIS — R059 Cough, unspecified: Secondary | ICD-10-CM | POA: Diagnosis not present

## 2024-01-01 DIAGNOSIS — G4733 Obstructive sleep apnea (adult) (pediatric): Secondary | ICD-10-CM | POA: Diagnosis present

## 2024-01-01 DIAGNOSIS — K449 Diaphragmatic hernia without obstruction or gangrene: Secondary | ICD-10-CM | POA: Diagnosis not present

## 2024-01-01 DIAGNOSIS — A419 Sepsis, unspecified organism: Principal | ICD-10-CM

## 2024-01-01 DIAGNOSIS — I2510 Atherosclerotic heart disease of native coronary artery without angina pectoris: Secondary | ICD-10-CM | POA: Diagnosis not present

## 2024-01-01 DIAGNOSIS — K5730 Diverticulosis of large intestine without perforation or abscess without bleeding: Secondary | ICD-10-CM | POA: Diagnosis not present

## 2024-01-01 DIAGNOSIS — Z87891 Personal history of nicotine dependence: Secondary | ICD-10-CM

## 2024-01-01 DIAGNOSIS — Z79899 Other long term (current) drug therapy: Secondary | ICD-10-CM | POA: Diagnosis not present

## 2024-01-01 DIAGNOSIS — K4090 Unilateral inguinal hernia, without obstruction or gangrene, not specified as recurrent: Secondary | ICD-10-CM | POA: Diagnosis not present

## 2024-01-01 DIAGNOSIS — Z833 Family history of diabetes mellitus: Secondary | ICD-10-CM

## 2024-01-01 DIAGNOSIS — R5381 Other malaise: Secondary | ICD-10-CM | POA: Diagnosis present

## 2024-01-01 DIAGNOSIS — Z7901 Long term (current) use of anticoagulants: Secondary | ICD-10-CM | POA: Diagnosis not present

## 2024-01-01 DIAGNOSIS — R1111 Vomiting without nausea: Secondary | ICD-10-CM | POA: Diagnosis not present

## 2024-01-01 DIAGNOSIS — R112 Nausea with vomiting, unspecified: Secondary | ICD-10-CM | POA: Diagnosis present

## 2024-01-01 DIAGNOSIS — K5792 Diverticulitis of intestine, part unspecified, without perforation or abscess without bleeding: Principal | ICD-10-CM | POA: Diagnosis present

## 2024-01-01 DIAGNOSIS — D72819 Decreased white blood cell count, unspecified: Secondary | ICD-10-CM | POA: Diagnosis not present

## 2024-01-01 DIAGNOSIS — R000 Tachycardia, unspecified: Secondary | ICD-10-CM | POA: Diagnosis not present

## 2024-01-01 DIAGNOSIS — E785 Hyperlipidemia, unspecified: Secondary | ICD-10-CM | POA: Insufficient documentation

## 2024-01-01 DIAGNOSIS — Z8616 Personal history of COVID-19: Secondary | ICD-10-CM | POA: Diagnosis not present

## 2024-01-01 DIAGNOSIS — I10 Essential (primary) hypertension: Secondary | ICD-10-CM | POA: Diagnosis not present

## 2024-01-01 DIAGNOSIS — R1084 Generalized abdominal pain: Secondary | ICD-10-CM | POA: Diagnosis not present

## 2024-01-01 LAB — COMPREHENSIVE METABOLIC PANEL
ALT: 21 U/L (ref 0–44)
AST: 25 U/L (ref 15–41)
Albumin: 4 g/dL (ref 3.5–5.0)
Alkaline Phosphatase: 67 U/L (ref 38–126)
Anion gap: 12 (ref 5–15)
BUN: 16 mg/dL (ref 8–23)
CO2: 21 mmol/L — ABNORMAL LOW (ref 22–32)
Calcium: 9.6 mg/dL (ref 8.9–10.3)
Chloride: 107 mmol/L (ref 98–111)
Creatinine, Ser: 1.2 mg/dL (ref 0.61–1.24)
GFR, Estimated: >60 mL/min (ref >60)
Glucose, Bld: 151 mg/dL — ABNORMAL HIGH (ref 70–99)
Potassium: 3.4 mmol/L — ABNORMAL LOW (ref 3.5–5.1)
Sodium: 140 mmol/L (ref 135–145)
Total Bilirubin: 1.2 mg/dL (ref 0.0–1.2)
Total Protein: 7 g/dL (ref 6.5–8.1)

## 2024-01-01 LAB — CBC WITH DIFFERENTIAL/PLATELET
Abs Immature Granulocytes: 0.01 10*3/uL (ref 0.00–0.07)
Basophils Absolute: 0 10*3/uL (ref 0.0–0.1)
Basophils Relative: 1 %
Eosinophils Absolute: 0.1 10*3/uL (ref 0.0–0.5)
Eosinophils Relative: 2 %
HCT: 46.2 % (ref 39.0–52.0)
Hemoglobin: 15 g/dL (ref 13.0–17.0)
Immature Granulocytes: 0 %
Lymphocytes Relative: 15 %
Lymphs Abs: 0.4 10*3/uL — ABNORMAL LOW (ref 0.7–4.0)
MCH: 27.9 pg (ref 26.0–34.0)
MCHC: 32.5 g/dL (ref 30.0–36.0)
MCV: 85.9 fL (ref 80.0–100.0)
Monocytes Absolute: 0 10*3/uL — ABNORMAL LOW (ref 0.1–1.0)
Monocytes Relative: 1 %
Neutro Abs: 2.3 10*3/uL (ref 1.7–7.7)
Neutrophils Relative %: 81 %
Platelets: 153 10*3/uL (ref 150–400)
RBC: 5.38 MIL/uL (ref 4.22–5.81)
RDW: 14 % (ref 11.5–15.5)
WBC: 2.9 10*3/uL — ABNORMAL LOW (ref 4.0–10.5)
nRBC: 0 % (ref 0.0–0.2)

## 2024-01-01 LAB — RESP PANEL BY RT-PCR (RSV, FLU A&B, COVID)  RVPGX2
Influenza A by PCR: NEGATIVE
Influenza B by PCR: NEGATIVE
Resp Syncytial Virus by PCR: NEGATIVE
SARS Coronavirus 2 by RT PCR: NEGATIVE

## 2024-01-01 MED ORDER — IOHEXOL 300 MG/ML  SOLN
100.0000 mL | Freq: Once | INTRAMUSCULAR | Status: AC | PRN
  Administered 2024-01-01: 100 mL via INTRAVENOUS

## 2024-01-01 MED ORDER — METRONIDAZOLE 500 MG/100ML IV SOLN
500.0000 mg | Freq: Once | INTRAVENOUS | Status: AC
  Administered 2024-01-01: 500 mg via INTRAVENOUS
  Filled 2024-01-01: qty 100

## 2024-01-01 MED ORDER — SODIUM CHLORIDE 0.9 % IV SOLN
1.0000 g | Freq: Once | INTRAVENOUS | Status: AC
  Administered 2024-01-01: 1 g via INTRAVENOUS
  Filled 2024-01-01: qty 10

## 2024-01-01 MED ORDER — MORPHINE SULFATE (PF) 4 MG/ML IV SOLN
4.0000 mg | Freq: Once | INTRAVENOUS | Status: AC
  Administered 2024-01-01: 4 mg via INTRAVENOUS
  Filled 2024-01-01: qty 1

## 2024-01-01 MED ORDER — LACTATED RINGERS IV BOLUS
1000.0000 mL | Freq: Once | INTRAVENOUS | Status: AC
  Administered 2024-01-01: 1000 mL via INTRAVENOUS

## 2024-01-01 MED ORDER — SODIUM CHLORIDE 0.9 % IV BOLUS
1000.0000 mL | Freq: Once | INTRAVENOUS | Status: AC
  Administered 2024-01-01: 1000 mL via INTRAVENOUS

## 2024-01-01 MED ORDER — ONDANSETRON HCL 4 MG/2ML IJ SOLN
4.0000 mg | Freq: Once | INTRAMUSCULAR | Status: AC
Start: 2024-01-01 — End: 2024-01-01
  Administered 2024-01-01: 4 mg via INTRAVENOUS
  Filled 2024-01-01: qty 2

## 2024-01-01 MED ORDER — ONDANSETRON HCL 4 MG/2ML IJ SOLN
4.0000 mg | Freq: Once | INTRAMUSCULAR | Status: AC
  Administered 2024-01-01: 4 mg via INTRAVENOUS
  Filled 2024-01-01: qty 2

## 2024-01-01 NOTE — ED Notes (Signed)
 Patient transported to CT

## 2024-01-01 NOTE — ED Provider Notes (Signed)
 [place] EMERGENCY DEPARTMENT AT [facility] Provider Note   CSN: [id number] Arrival date & time: 01/01/24  1909     History  Chief Complaint  Patient presents with   Emesis    Demetric TAEQUAN STOCKHAUSEN is a 83 y.o. male.  This is an 83 year old male who is here today for abdominal pain.  Patient reports over the last days.  Patient actually came to the emergency room today to drop off his wife for unrelated symptoms, however and is out in the car began to feel worse and worse.  Patient has a history of diverticulitis, is concerned that he may have this again.   Emesis      Home Medications Prior to Admission medications   Medication Sig Start Date End Date Taking? Authorizing Provider  acetaminophen (TYLENOL) 325 MG tablet Take 2 tablets (650 mg total) by mouth every 6 (six) hours as needed for mild pain (pain score 1-3), fever or headache. Patient not taking: Reported on 10/23/2023 09/28/23   Shon Hale, MD  apixaban (ELIQUIS) 5 MG TABS tablet Take 1 tablet (5 mg total) by mouth 2 (two) times daily. 10/03/23   Johnson, Clanford L, MD  isosorbide mononitrate (IMDUR) 30 MG 24 hr tablet TAKE ONE TABLET (30MG  TOTAL) BY MOUTH DAILY 09/20/23   Panosh, [name], MD  latanoprost (XALATAN) 0.005 % ophthalmic solution Place 1 drop into both eyes at bedtime. 08/09/18   Wynn Banker, MD  meclizine (ANTIVERT) 25 MG tablet Take 1 tablet (25 mg total) by mouth 3 (three) times daily as needed for dizziness. Patient not taking: Reported on 10/23/2023 09/28/23   Shon Hale, MD  nitroGLYCERIN (NITROSTAT) 0.4 MG SL tablet PLACE ONE TONGUE (0.4 MG TOTAL) UNDER THE TONGUE EVERY 5 MINUTES AS NEEDED FORCHEST PAIN. 11/29/23   Dunn, Tacey Ruiz, [name]  olmesartan (BENICAR) 40 MG tablet Take 40 mg by mouth daily. 11/20/23   [provider]  rosuvastatin (CRESTOR) 20 MG tablet Take 1 tablet (20 mg total) by mouth daily. 12/26/23   [name], MD      Allergies    Ciprofloxacin,  Metronidazole, Penicillins, Quinapril hcl, Tetracycline hcl, and Amoxicillin    Review of Systems   Review of Systems  Gastrointestinal:  Positive for vomiting.    Physical Exam Updated Vital Signs BP 126/73   Pulse 90   Temp 98.2 F (36.8 C) (Oral)   Resp (!) 30   Ht 5\' 8"  (1.727 m)   Wt 90 kg   SpO2 97%   BMI 30.17 kg/m  Physical Exam Vitals reviewed.  Constitutional:      Appearance: He is ill-appearing.  Eyes:     Pupils: Pupils are equal, round, and reactive to light.  Cardiovascular:     Rate and Rhythm: Normal rate.  Abdominal:     General: Abdomen is flat.     Palpations: Abdomen is soft.     Tenderness: There is abdominal tenderness.     Comments: Left lower quadrant tenderness  Neurological:     General: No focal deficit present.     Mental Status: He is alert.     ED Results / Procedures / Treatments   Labs (all labs ordered are listed, but only abnormal results are displayed) Labs Reviewed  COMPREHENSIVE METABOLIC PANEL - Abnormal; Notable for the following components:      Result Value   Potassium 3.4 (*)    CO2 21 (*)    Glucose, Bld 151 (*)  All other components within normal limits  CBC WITH DIFFERENTIAL/PLATELET - Abnormal; Notable for the following components:   WBC 2.9 (*)    Lymphs Abs 0.4 (*)    Monocytes Absolute 0.0 (*)    All other components within normal limits  RESP PANEL BY RT-PCR (RSV, FLU A&B, COVID)  [id number]    EKG None  Radiology CT ABDOMEN PELVIS W CONTRAST Result Date: 01/01/2024 CLINICAL DATA:  Abdominal pain, nausea, vomiting EXAM: CT ABDOMEN AND PELVIS WITH CONTRAST TECHNIQUE: Multidetector CT imaging of the abdomen and pelvis was performed using the standard protocol following bolus administration of intravenous contrast. RADIATION DOSE REDUCTION: This exam was performed according to the departmental dose-optimization program which includes automated exposure control, adjustment of the mA and/or kV according to  patient size and/or use of iterative reconstruction technique. CONTRAST:  OMNIPAQUE IOHEXOL 300 MG/ML  SOLN COMPARISON:  09/24/2023 FINDINGS: Lower chest: Dense calcifications in the visualized right coronary artery. Dependent atelectasis. No effusions. Small hiatal hernia. Hepatobiliary: No focal hepatic abnormality. Gallbladder unremarkable. Pancreas: No focal abnormality or ductal dilatation. Fatty replacement. Spleen: No focal abnormality.  Normal size. Adrenals/Urinary Tract: Adrenal glands unremarkable. Bilateral perinephric stranding is increased since prior study. No renal or ureteral stones. No hydronephrosis. Urinary bladder unremarkable. Stomach/Bowel: Left colonic diverticulosis. Inflammatory stranding adjacent to the distal descending colon/proximal sigmoid colon compatible with active diverticulitis. Stomach and small bowel decompressed. No bowel obstruction. Vascular/Lymphatic: Aortic atherosclerosis. No evidence of aneurysm or adenopathy. Reproductive: Mildly prominent prostate with central calcifications. Other: No free fluid or free air. Left inguinal hernia contains fat. Musculoskeletal: No acute bony abnormality. IMPRESSION: Left colonic diverticulosis. Inflammatory stranding adjacent to the distal descending colon and proximal sigmoid colon compatible with active diverticulitis. No complicating feature. Coronary artery disease, aortic atherosclerosis. Electronically Signed   By: Charlett Nose M.D.   On: 01/01/2024 21:50    Procedures Procedures    Medications Ordered in ED Medications  cefTRIAXone (ROCEPHIN) 1 g in sodium chloride 0.9 % 100 mL IVPB (1 g Intravenous New Bag/Given 01/01/24 2223)  metroNIDAZOLE (FLAGYL) IVPB 500 mg (has no administration in time range)  lactated ringers bolus 1,000 mL (has no administration in time range)  sodium chloride 0.9 % bolus 1,000 mL (1,000 mLs Intravenous New Bag/Given 01/01/24 1950)  morphine (PF) 4 MG/ML injection 4 mg (4 mg Intravenous  Given 01/01/24 1947)  ondansetron (ZOFRAN) injection 4 mg (4 mg Intravenous Given 01/01/24 1948)  iohexol (OMNIPAQUE) 300 MG/ML solution 100 mL (100 mLs Intravenous Contrast Given 01/01/24 2040)  ondansetron (ZOFRAN) injection 4 mg (4 mg Intravenous Given 01/01/24 2222)    ED Course/ Medical Decision Making/ A&P                                 Medical Decision Making 83 year old male here today with abdominal pain.  Differential diagnoses include diverticulitis, intra-abdominal infection, bowel obstruction, volvulus.  Plan-patient very tender in the left lower quadrant.  I am concerned that he may have diverticulitis.  Patient has a soft abdomen, however he does have guarding.  Obtain CT imaging which did show acute diverticulitis.     Patient with penicillin allergy, will start him on Rocephin and metronidazole.   Patient looks a bit worse than his imaging and labs are indicated.  He is leukopenic, has normal renal function.  On exam, the patient generally looks crummy.  I think he will require IV antibiotics and a close watch  given his clinical presentation.  Patient did receive some morphine, responded well to this.  Patient currently normotensive, nontachycardic.  Will admit patient to hospitalist for diverticulitis.  Amount and/or Complexity of Data Reviewed Labs: ordered. Radiology: ordered.  Risk Prescription drug management.           Final Clinical Impression(s) / ED Diagnoses Final diagnoses:  Diverticulitis    Rx / DC Orders ED Discharge Orders     None         Arletha Pili, DO 01/01/24 2227

## 2024-01-01 NOTE — ED Triage Notes (Signed)
 Pt c/o abd pain with n/v.

## 2024-01-02 DIAGNOSIS — I482 Chronic atrial fibrillation, unspecified: Secondary | ICD-10-CM | POA: Insufficient documentation

## 2024-01-02 DIAGNOSIS — A419 Sepsis, unspecified organism: Secondary | ICD-10-CM

## 2024-01-02 DIAGNOSIS — I10 Essential (primary) hypertension: Secondary | ICD-10-CM

## 2024-01-02 DIAGNOSIS — K5792 Diverticulitis of intestine, part unspecified, without perforation or abscess without bleeding: Secondary | ICD-10-CM | POA: Diagnosis not present

## 2024-01-02 DIAGNOSIS — E785 Hyperlipidemia, unspecified: Secondary | ICD-10-CM | POA: Insufficient documentation

## 2024-01-02 LAB — CBC
HCT: 44.1 % (ref 39.0–52.0)
Hemoglobin: 14.2 g/dL (ref 13.0–17.0)
MCH: 27.7 pg (ref 26.0–34.0)
MCHC: 32.2 g/dL (ref 30.0–36.0)
MCV: 86.1 fL (ref 80.0–100.0)
Platelets: 156 10*3/uL (ref 150–400)
RBC: 5.12 MIL/uL (ref 4.22–5.81)
RDW: 14.1 % (ref 11.5–15.5)
WBC: 16.7 10*3/uL — ABNORMAL HIGH (ref 4.0–10.5)
nRBC: 0 % (ref 0.0–0.2)

## 2024-01-02 LAB — CORTISOL-AM, BLOOD: Cortisol - AM: 23.6 ug/dL — ABNORMAL HIGH (ref 6.7–22.6)

## 2024-01-02 LAB — BASIC METABOLIC PANEL
Anion gap: 11 (ref 5–15)
BUN: 16 mg/dL (ref 8–23)
CO2: 20 mmol/L — ABNORMAL LOW (ref 22–32)
Calcium: 9.1 mg/dL (ref 8.9–10.3)
Chloride: 110 mmol/L (ref 98–111)
Creatinine, Ser: 1.21 mg/dL (ref 0.61–1.24)
GFR, Estimated: 59 mL/min — ABNORMAL LOW (ref >60)
Glucose, Bld: 137 mg/dL — ABNORMAL HIGH (ref 70–99)
Potassium: 3.7 mmol/L (ref 3.5–5.1)
Sodium: 141 mmol/L (ref 135–145)

## 2024-01-02 LAB — PROTIME-INR
INR: 1.5 — ABNORMAL HIGH (ref 0.8–1.2)
Prothrombin Time: 18.2 s — ABNORMAL HIGH (ref 11.4–15.2)

## 2024-01-02 MED ORDER — APIXABAN 5 MG PO TABS
5.0000 mg | ORAL_TABLET | Freq: Two times a day (BID) | ORAL | Status: DC
  Administered 2024-01-02 – 2024-01-05 (×8): 5 mg via ORAL
  Filled 2024-01-02 (×8): qty 1

## 2024-01-02 MED ORDER — METRONIDAZOLE 500 MG/100ML IV SOLN: 500.0000 mg | Freq: Two times a day (BID) | INTRAVENOUS | Status: DC

## 2024-01-02 MED ORDER — LACTATED RINGERS IV SOLN
150.0000 mL/h | INTRAVENOUS | Status: AC
  Administered 2024-01-02 (×2): 150 mL/h via INTRAVENOUS

## 2024-01-02 MED ORDER — ISOSORBIDE MONONITRATE ER 60 MG PO TB24
30.0000 mg | ORAL_TABLET | Freq: Every day | ORAL | Status: DC
  Administered 2024-01-02 – 2024-01-05 (×4): 30 mg via ORAL
  Filled 2024-01-02 (×4): qty 1

## 2024-01-02 MED ORDER — LATANOPROST 0.005 % OP SOLN
1.0000 [drp] | Freq: Every day | OPHTHALMIC | Status: DC
Start: 2024-01-02 — End: 2024-01-05
  Administered 2024-01-02 – 2024-01-04 (×4): 1 [drp] via OPHTHALMIC
  Filled 2024-01-02 (×2): qty 2.5

## 2024-01-02 MED ORDER — TRAZODONE HCL 50 MG PO TABS
25.0000 mg | ORAL_TABLET | Freq: Every evening | ORAL | Status: DC | PRN
  Administered 2024-01-02: 25 mg via ORAL
  Filled 2024-01-02: qty 1

## 2024-01-02 MED ORDER — ACETAMINOPHEN 325 MG PO TABS
650.0000 mg | ORAL_TABLET | Freq: Four times a day (QID) | ORAL | Status: DC | PRN
  Administered 2024-01-03 (×2): 650 mg via ORAL
  Filled 2024-01-02 (×2): qty 2

## 2024-01-02 MED ORDER — NITROGLYCERIN 0.4 MG SL SUBL: 0.4000 mg | SUBLINGUAL_TABLET | SUBLINGUAL | Status: DC | PRN

## 2024-01-02 MED ORDER — POLYETHYLENE GLYCOL 3350 17 G PO PACK
17.0000 g | PACK | Freq: Every day | ORAL | Status: DC
  Administered 2024-01-05: 17 g via ORAL
  Filled 2024-01-02 (×4): qty 1

## 2024-01-02 MED ORDER — SODIUM CHLORIDE 0.9 % IV SOLN
2.0000 g | INTRAVENOUS | Status: DC
  Administered 2024-01-02 – 2024-01-05 (×4): 2 g via INTRAVENOUS
  Filled 2024-01-02 (×4): qty 20

## 2024-01-02 MED ORDER — ROSUVASTATIN CALCIUM 20 MG PO TABS
20.0000 mg | ORAL_TABLET | Freq: Every day | ORAL | Status: DC
  Administered 2024-01-02 – 2024-01-05 (×4): 20 mg via ORAL
  Filled 2024-01-02 (×4): qty 1

## 2024-01-02 MED ORDER — ACETAMINOPHEN 650 MG RE SUPP: 650.0000 mg | Freq: Four times a day (QID) | RECTAL | Status: DC | PRN

## 2024-01-02 MED ORDER — SENNOSIDES-DOCUSATE SODIUM 8.6-50 MG PO TABS
1.0000 | ORAL_TABLET | Freq: Two times a day (BID) | ORAL | Status: DC
  Administered 2024-01-02 – 2024-01-05 (×7): 1 via ORAL
  Filled 2024-01-02 (×7): qty 1

## 2024-01-02 MED ORDER — MORPHINE SULFATE (PF) 2 MG/ML IV SOLN
2.0000 mg | INTRAVENOUS | Status: DC | PRN
  Administered 2024-01-02 (×2): 2 mg via INTRAVENOUS
  Filled 2024-01-02 (×2): qty 1

## 2024-01-02 MED ORDER — METRONIDAZOLE 500 MG/100ML IV SOLN
500.0000 mg | Freq: Two times a day (BID) | INTRAVENOUS | Status: DC
  Administered 2024-01-02 – 2024-01-03 (×3): 500 mg via INTRAVENOUS
  Filled 2024-01-02 (×3): qty 100

## 2024-01-02 MED ORDER — IRBESARTAN 75 MG PO TABS
37.5000 mg | ORAL_TABLET | Freq: Every day | ORAL | Status: DC
  Administered 2024-01-02 – 2024-01-05 (×4): 37.5 mg via ORAL
  Filled 2024-01-02 (×4): qty 1

## 2024-01-02 NOTE — ED Notes (Addendum)
 Pt lying in bed resting. Pt stated, " I am having slight abdomen pain when moving. I don't want any medication right now though." Denied episodes of N/V, diarrhea, or at this time. Pt unable to communicate last BM to this nurse. Abdomen does appear distended at this time. Currently on 3L saturation is above 92%. Pt is waiting on bed placement.

## 2024-01-02 NOTE — Assessment & Plan Note (Addendum)
-   The patient be admitted to a medical bed. - We will continue antibiotic therapy with IV Flagyl and p.o. Rocephin. - Pain management will be provided. - The patient will be placed on clear liquids as tolerated.

## 2024-01-02 NOTE — Assessment & Plan Note (Signed)
-   We will continue Eliquis while monitoring for bleeding with diverticulitis.

## 2024-01-02 NOTE — ED Notes (Signed)
 Pt placed on 2L  due to sats 91% prior to giving narcotic. No resp distress noted.

## 2024-01-02 NOTE — Assessment & Plan Note (Signed)
-   We will continue Imdur, statin therapy and ARB therapy.

## 2024-01-02 NOTE — Assessment & Plan Note (Signed)
Will continue antihypertensive therapy.

## 2024-01-02 NOTE — Assessment & Plan Note (Signed)
 repleted   Serial BMP
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
## 2024-01-02 NOTE — ED Notes (Signed)
 Pt stated, " I still feel the same. I still don't want medicine."

## 2024-01-02 NOTE — ED Notes (Signed)
 Received report from [facility]. Pt resting. Water given per request. Nad. Awaiting admission bed.

## 2024-01-02 NOTE — TOC CM/SW Note (Signed)
 Transition of Care [facility]) - Inpatient Brief Assessment   Patient Details  Name: Bryan Cabrera MRN: [id number] Date of Birth: [birth year]  Transition of [facility]) CM/SW Contact:    Villa Herb, LCSWA Phone Number: 01/02/2024, 9:00 AM   Clinical Narrative: Transition of Care Department [facility]) has reviewed patient and no TOC needs have been identified at this time. We will continue to monitor patient advancement through interdiciplinary progression rounds. If new patient transition needs arise, please place a TOC consult.   Transition of Care Asessment: Insurance and Status: Insurance coverage has been reviewed Patient has primary care physician: Yes Home environment has been reviewed: From home Prior level of function:: Independent Prior/Current Home Services: No current home services Social Drivers of Health Review: SDOH reviewed no interventions necessary Readmission risk has been reviewed: Yes Transition of care needs: no transition of care needs at this time

## 2024-01-02 NOTE — Hospital Course (Addendum)
 Bryan Cabrera is a 83 y.o. male with medical history significant for atrial fibrillation, hypertension, dyslipidemia, OSA, osteoarthritis and vertigo, who presented to the emergency room with acute onset of recurrent nausea and vomiting over the last 3 days with associated left lower quadrant abdominal pain.  The patient denied any bilious vomitus or hematemesis.  No dysuria, oliguria.  Dysuria or flank pain.  No cough or wheezing or dyspnea.  No chest pain or palpitations.   ED Course:HR 104 with otherwise normal vital signs.  Labs revealed hypokalemia of 3.4 and a blood K+ 151 with CO2 21 with otherwise unremarkable CMP.  CBC showed leukopenia of 2.9 and was otherwise unremarkable.   Respiratory panel came back negative. EKG as reviewed by me : EKG showed sinus tachycardia with rate of 98 with PACs and borderline prolonged QT interval with QTc 492 MS. Imaging: Portable chest x-ray showed no acute cardiopulmonary disease.   Abdominal pelvic CT scan with contrast revealed the following: Left colonic diverticulosis. Inflammatory stranding adjacent to the distal descending colon and proximal sigmoid colon compatible with active diverticulitis.  Coronary artery disease, aortic atherosclerosis.   The patient was given IV Rocephin and Flagyl, a liter bolus of IV normal saline, IV morphine and IV Zofran twice and 1 L bolus of IV lactic ringer.  He will be admitted to a medical telemetry bed for further evaluation and management.     IMPRESSION AND PLAN:  Assessment and Plan:  * Acute diverticulitis - Improved abdominal pain with IV meds - will continue antibiotic therapy with IV Flagyl and p.o. Rocephin. - Pain management will be provided. - The patient will be placed on clear liquids as tolerated.  -Worsening leukocytosis, but afebrile, normotensive,   Sepsis due to undetermined organism (HCC)-secondary to diverticulitis - Improved sepsis physiology - POA: tachycardia and tachypnea as well as  leukopenia. - Continue  IVF lactated ringer. - Will follow blood cultures. - He will be on IV Rocephin and Flagyl as mentioned above.   Hypokalemia - Potassium will be replaced. -Monitoring closely  Essential hypertension Home medication reviewed, continue Imdur,irbesartan As needed IV hydralazine   Dyslipidemia - Continue Crestor   Atrial fibrillation, chronic (HCC) - We will continue Eliquis    Coronary artery disease - We will continue Imdur, statin therapy and ARB therapy.

## 2024-01-02 NOTE — Progress Notes (Signed)
 PROGRESS NOTE    Patient: Bryan Cabrera                            PCP: [name], MD                    DOB: 1941-08-05            DOA: 01/01/2024 WGN:[id number]             DOS: 01/02/2024, 10:42 AM   LOS: 1 day   Date of Service: The patient was seen and examined on 01/02/2024  Subjective:   The patient was seen and examined this morning. Hemodynamically stable. Still complaining of abdominal tenderness, improved pain with IV meds  Brief Narrative:   Bryan Cabrera is a 83 y.o. male with medical history significant for atrial fibrillation, hypertension, dyslipidemia, OSA, osteoarthritis and vertigo, who presented to the emergency room with acute onset of recurrent nausea and vomiting over the last 3 days with associated left lower quadrant abdominal pain.  The patient denied any bilious vomitus or hematemesis.  No dysuria, oliguria.  Dysuria or flank pain.  No cough or wheezing or dyspnea.  No chest pain or palpitations.   ED Course:HR 104 with otherwise normal vital signs.  Labs revealed hypokalemia of 3.4 and a blood K+ 151 with CO2 21 with otherwise unremarkable CMP.  CBC showed leukopenia of 2.9 and was otherwise unremarkable.   Respiratory panel came back negative. EKG as reviewed by me : EKG showed sinus tachycardia with rate of 98 with PACs and borderline prolonged QT interval with QTc 492 MS. Imaging: Portable chest x-ray showed no acute cardiopulmonary disease.   Abdominal pelvic CT scan with contrast revealed the following: Left colonic diverticulosis. Inflammatory stranding adjacent to the distal descending colon and proximal sigmoid colon compatible with active diverticulitis.  Coronary artery disease, aortic atherosclerosis.   The patient was given IV Rocephin and Flagyl, a liter bolus of IV normal saline, IV morphine and IV Zofran twice and 1 L bolus of IV lactic ringer.  He will be admitted to a medical telemetry bed for further evaluation and management.      IMPRESSION AND PLAN:  Assessment and Plan:  * Acute diverticulitis -  - We will continue antibiotic therapy with IV Flagyl and p.o. Rocephin. - Pain management will be provided. - The patient will be placed on clear liquids as tolerated.     Sepsis due to undetermined organism (HCC)-secondary to diverticulitis - Improved sepsis physiology - POA: tachycardia and tachypnea as well as leukopenia. - Continue  IVF lactated ringer. - Will follow blood cultures. - He will be on IV Rocephin and Flagyl as mentioned above.   Hypokalemia - Potassium will be replaced. -Monitoring closely Essential hypertension Will continue antihypertensive therapy.   Dyslipidemia - We will continue statin therapy.   Atrial fibrillation, chronic (HCC) - We will continue Eliquis while monitoring for bleeding with diverticulitis.   Coronary artery disease - We will continue Imdur, statin therapy and ARB therapy.        Assessment & Plan:   Principal Problem:   Acute diverticulitis Active Problems:   Sepsis due to undetermined organism (HCC)   Hypokalemia   Essential hypertension   Coronary artery disease   Atrial fibrillation, chronic (HCC)   Dyslipidemia     Assessment and Plan: * Acute diverticulitis - The patient be admitted to a medical bed. - We  will continue antibiotic therapy with IV Flagyl and p.o. Rocephin. - Pain management will be provided. - The patient will be placed on clear liquids as tolerated.   Sepsis due to undetermined organism [facility]) - This is clearly secondary to #1. - It is manifested by tachycardia and tachypnea as well as leukopenia. - The patient be hydrated with IV lactated ringer. - We will follow blood cultures. - He will be on IV Rocephin and Flagyl as mentioned above.  Hypokalemia - Potassium will be replaced.  Essential hypertension Will continue antihypertensive therapy.  Dyslipidemia - We will continue statin therapy.  Atrial  fibrillation, chronic (HCC) - We will continue Eliquis while monitoring for bleeding with diverticulitis.  Coronary artery disease - We will continue Imdur, statin therapy and ARB therapy.       ----------------------------------------------------------------------------------------------------------------------------------------------- Nutritional status:  The patient's BMI is: Body mass index is 30.17 kg/m. I agree with the assessment and plan as outlined ------------------------------------------------------------------------------------------------------------------------------------------ Cultures; Blood Cultures x 2 >> -------------------------------------------------------------------------------------------------------------------------------------------  DVT prophylaxis:   apixaban (ELIQUIS) tablet 5 mg   Code Status:   Code Status: Full Code  Family Communication: Wife present at bedside -Advance care planning has been discussed.   Admission status:   Status is: Inpatient Remains inpatient appropriate because: Needing IV antibiotics   Disposition: From  - home             Planning for discharge in 1-2 days: to   Procedures:   No admission procedures for hospital encounter.   Antimicrobials:  Anti-infectives (From admission, onward)    Start     Dose/Rate Route Frequency Ordered Stop   01/02/24 1200  metroNIDAZOLE (FLAGYL) IVPB 500 mg        500 mg 100 mL/hr over 60 Minutes Intravenous Every 12 hours 01/02/24 0718 01/04/24 2359   01/02/24 1000  cefTRIAXone (ROCEPHIN) 2 g in sodium chloride 0.9 % 100 mL IVPB        2 g 200 mL/hr over 30 Minutes Intravenous Every 24 hours 01/02/24 0013 01/09/24 0959   01/02/24 1000  metroNIDAZOLE (FLAGYL) IVPB 500 mg  Status:  Discontinued        500 mg 100 mL/hr over 60 Minutes Intravenous Every 12 hours 01/02/24 0013 01/02/24 0718   01/01/24 2200  cefTRIAXone (ROCEPHIN) 1 g in sodium chloride 0.9 % 100 mL IVPB        1  g 200 mL/hr over 30 Minutes Intravenous  Once 01/01/24 2154 01/01/24 2253   01/01/24 2200  metroNIDAZOLE (FLAGYL) IVPB 500 mg        500 mg 100 mL/hr over 60 Minutes Intravenous  Once 01/01/24 2154 01/02/24 0539        Medication:   apixaban  5 mg Oral BID   irbesartan  37.5 mg Oral Daily   isosorbide mononitrate  30 mg Oral Daily   latanoprost  1 drop Both Eyes QHS   rosuvastatin  20 mg Oral Daily    acetaminophen **OR** acetaminophen, morphine injection, nitroGLYCERIN, traZODone   Objective:   Vitals:   01/02/24 0400 01/02/24 0423 01/02/24 0545 01/02/24 0800  BP: 127/64  132/83 131/74  Pulse: 90 90 96 93  Resp: (!) 27 19 19 18   Temp:   98.6 F (37 C)   TempSrc:   Oral   SpO2: 90% 90% 95% 93%  Weight:      Height:        Intake/Output Summary (Last 24 hours) at 01/02/2024 1042 Last data filed at 01/02/2024 0730  Gross per 24 hour  Intake 2950.38 ml  Output 50 ml  Net 2900.38 ml   Filed Weights   01/01/24 1916  Weight: 90 kg     Physical examination:   Constitution:  Alert, cooperative, no distress,  Appears calm and comfortable  Psychiatric:   Normal and stable mood and affect, cognition intact,   HEENT:        Normocephalic, PERRL, otherwise with in Normal limits  Chest:         Chest symmetric Cardio vascular:  S1/S2, RRR, No murmure, No Rubs or Gallops  pulmonary: Clear to auscultation bilaterally, respirations unlabored, negative wheezes / crackles Abdomen: Soft, mild lower quadrant tenderness,, non-distended, bowel sounds,no masses, no organomegaly Muscular skeletal: Limited exam - in bed, able to move all 4 extremities,   Neuro: CNII-XII intact. , normal motor and sensation, reflexes intact  Extremities: No pitting edema lower extremities, +2 pulses  Skin: Dry, warm to touch, negative for any Rashes, No open wounds Wounds: per nursing  documentation   ------------------------------------------------------------------------------------------------------------------------------------------    LABs:     Latest Ref Rng & Units 01/02/2024    5:42 AM 01/01/2024    7:50 PM 10/01/2023    5:05 AM  CBC  WBC 4.0 - 10.5 K/uL 16.7  2.9  14.8   Hemoglobin 13.0 - 17.0 g/dL 30.8  65.7  84.6   Hematocrit 39.0 - 52.0 % 44.1  46.2  46.1   Platelets 150 - 400 K/uL 156  153  221       Latest Ref Rng & Units 01/02/2024    5:42 AM 01/01/2024    7:50 PM 10/01/2023    5:05 AM  CMP  Glucose 70 - 99 mg/dL 962  952  841   BUN 8 - 23 mg/dL 16  16  22    Creatinine 0.61 - 1.24 mg/dL 3.24  4.01  0.27   Sodium 135 - 145 mmol/L 141  140  136   Potassium 3.5 - 5.1 mmol/L 3.7  3.4  3.6   Chloride 98 - 111 mmol/L 110  107  105   CO2 22 - 32 mmol/L 20  21  22    Calcium 8.9 - 10.3 mg/dL 9.1  9.6  9.2   Total Protein 6.5 - 8.1 g/dL  7.0  5.8   Total Bilirubin 0.0 - 1.2 mg/dL  1.2  0.8   Alkaline Phos 38 - 126 U/L  67  46   AST 15 - 41 U/L  25  15   ALT 0 - 44 U/L  21  35        Micro Results Recent Results (from the past 240 hours)  Resp panel by RT-PCR (RSV, Flu A&B, Covid) Anterior Nasal Swab     Status: None   Collection Time: 01/01/24  8:25 PM   Specimen: Anterior Nasal Swab  Result Value Ref Range Status   SARS Coronavirus 2 by RT PCR NEGATIVE NEGATIVE Final    Comment: (NOTE) SARS-CoV-2 target nucleic acids are NOT DETECTED.  The SARS-CoV-2 RNA is generally detectable in upper respiratory specimens during the acute phase of infection. The lowest concentration of SARS-CoV-2 viral copies this assay can detect is 138 copies/mL. A negative result does not preclude SARS-Cov-2 infection and should not be used as the sole basis for treatment or other patient management decisions. A negative result may occur with  improper specimen collection/handling, submission of specimen other than nasopharyngeal swab, presence of viral  mutation(s) within the areas targeted  by this assay, and inadequate number of viral copies(<138 copies/mL). A negative result must be combined with clinical observations, patient history, and epidemiological information. The expected result is Negative.  Fact Sheet for Patients:  BloggerCourse.com  Fact Sheet for Healthcare Providers:  SeriousBroker.it  This test is no t yet approved or cleared by the [place] FDA and  has been authorized for detection and/or diagnosis of SARS-CoV-2 by FDA under an Emergency Use Authorization (EUA). This EUA will remain  in effect (meaning this test can be used) for the duration of the COVID-19 declaration under Section 564(b)(1) of the Act, 21 U.S.C.section 360bbb-3(b)(1), unless the authorization is terminated  or revoked sooner.       Influenza A by PCR NEGATIVE NEGATIVE Final   Influenza B by PCR NEGATIVE NEGATIVE Final    Comment: (NOTE) The Xpert Xpress SARS-CoV-2/FLU/RSV plus assay is intended as an aid in the diagnosis of influenza from Nasopharyngeal swab specimens and should not be used as a sole basis for treatment. Nasal washings and aspirates are unacceptable for Xpert Xpress SARS-CoV-2/FLU/RSV testing.  Fact Sheet for Patients: BloggerCourse.com  Fact Sheet for Healthcare Providers: SeriousBroker.it  This test is not yet approved or cleared by the [place] FDA and has been authorized for detection and/or diagnosis of SARS-CoV-2 by FDA under an Emergency Use Authorization (EUA). This EUA will remain in effect (meaning this test can be used) for the duration of the COVID-19 declaration under Section 564(b)(1) of the Act, 21 U.S.C. section 360bbb-3(b)(1), unless the authorization is terminated or revoked.     Resp Syncytial Virus by PCR NEGATIVE NEGATIVE Final    Comment: (NOTE) Fact Sheet for  Patients: BloggerCourse.com  Fact Sheet for Healthcare Providers: SeriousBroker.it  This test is not yet approved or cleared by the [place] FDA and has been authorized for detection and/or diagnosis of SARS-CoV-2 by FDA under an Emergency Use Authorization (EUA). This EUA will remain in effect (meaning this test can be used) for the duration of the COVID-19 declaration under Section 564(b)(1) of the Act, 21 U.S.C. section 360bbb-3(b)(1), unless the authorization is terminated or revoked.  Performed at [facility] [address], [place], [place] [address]     Radiology Reports DG Chest 1 View Result Date: 01/01/2024 CLINICAL DATA:  Cough EXAM: CHEST  1 VIEW COMPARISON:  09/30/2023 FINDINGS: Heart and mediastinal contours are within normal limits. No focal opacities or effusions. No acute bony abnormality. IMPRESSION: No active disease. Electronically Signed   By: Charlett Nose M.D.   On: 01/01/2024 23:16   CT ABDOMEN PELVIS W CONTRAST Result Date: 01/01/2024 CLINICAL DATA:  Abdominal pain, nausea, vomiting EXAM: CT ABDOMEN AND PELVIS WITH CONTRAST TECHNIQUE: Multidetector CT imaging of the abdomen and pelvis was performed using the standard protocol following bolus administration of intravenous contrast. RADIATION DOSE REDUCTION: This exam was performed according to the departmental dose-optimization program which includes automated exposure control, adjustment of the mA and/or kV according to patient size and/or use of iterative reconstruction technique. CONTRAST:  OMNIPAQUE IOHEXOL 300 MG/ML  SOLN COMPARISON:  09/24/2023 FINDINGS: Lower chest: Dense calcifications in the visualized right coronary artery. Dependent atelectasis. No effusions. Small hiatal hernia. Hepatobiliary: No focal hepatic abnormality. Gallbladder unremarkable. Pancreas: No focal abnormality or ductal dilatation. Fatty replacement. Spleen: No focal abnormality.   Normal size. Adrenals/Urinary Tract: Adrenal glands unremarkable. Bilateral perinephric stranding is increased since prior study. No renal or ureteral stones. No hydronephrosis. Urinary bladder unremarkable. Stomach/Bowel: Left colonic diverticulosis. Inflammatory stranding adjacent  to the distal descending colon/proximal sigmoid colon compatible with active diverticulitis. Stomach and small bowel decompressed. No bowel obstruction. Vascular/Lymphatic: Aortic atherosclerosis. No evidence of aneurysm or adenopathy. Reproductive: Mildly prominent prostate with central calcifications. Other: No free fluid or free air. Left inguinal hernia contains fat. Musculoskeletal: No acute bony abnormality. IMPRESSION: Left colonic diverticulosis. Inflammatory stranding adjacent to the distal descending colon and proximal sigmoid colon compatible with active diverticulitis. No complicating feature. Coronary artery disease, aortic atherosclerosis. Electronically Signed   By: Charlett Nose M.D.   On: 01/01/2024 21:50    SIGNED: Kendell Bane, MD, FHM. FAAFP. [facility] - [facility] Time spent - 35 min.  In seeing, evaluating and examining the patient. Reviewing medical records, labs, drawn plan of care. [facility],  Pager (please use [email or web address] to page/ text) Please use Epic Secure Chat for non-urgent communication (7AM-7PM)  If 7PM-7AM, please contact night-coverage [email or web address], 01/02/2024, 10:42 AM

## 2024-01-02 NOTE — Assessment & Plan Note (Addendum)
-   This is clearly secondary to #1. - It is manifested by tachycardia and tachypnea as well as leukopenia. - The patient be hydrated with IV lactated ringer. - We will follow blood cultures. - He will be on IV Rocephin and Flagyl as mentioned above.

## 2024-01-02 NOTE — Assessment & Plan Note (Signed)
 -  We will continue statin therapy.

## 2024-01-03 DIAGNOSIS — K5792 Diverticulitis of intestine, part unspecified, without perforation or abscess without bleeding: Secondary | ICD-10-CM | POA: Diagnosis not present

## 2024-01-03 LAB — CBC
HCT: 39.8 % (ref 39.0–52.0)
Hemoglobin: 12.8 g/dL — ABNORMAL LOW (ref 13.0–17.0)
MCH: 28 pg (ref 26.0–34.0)
MCHC: 32.2 g/dL (ref 30.0–36.0)
MCV: 87.1 fL (ref 80.0–100.0)
Platelets: 126 10*3/uL — ABNORMAL LOW (ref 150–400)
RBC: 4.57 MIL/uL (ref 4.22–5.81)
RDW: 14.2 % (ref 11.5–15.5)
WBC: 9 10*3/uL (ref 4.0–10.5)
nRBC: 0 % (ref 0.0–0.2)

## 2024-01-03 LAB — BASIC METABOLIC PANEL
Anion gap: 6 (ref 5–15)
BUN: 15 mg/dL (ref 8–23)
CO2: 24 mmol/L (ref 22–32)
Calcium: 8.9 mg/dL (ref 8.9–10.3)
Chloride: 108 mmol/L (ref 98–111)
Creatinine, Ser: 0.94 mg/dL (ref 0.61–1.24)
GFR, Estimated: >60 mL/min (ref >60)
Glucose, Bld: 105 mg/dL — ABNORMAL HIGH (ref 70–99)
Potassium: 3.5 mmol/L (ref 3.5–5.1)
Sodium: 138 mmol/L (ref 135–145)

## 2024-01-03 MED ORDER — METRONIDAZOLE 500 MG/100ML IV SOLN
500.0000 mg | Freq: Two times a day (BID) | INTRAVENOUS | Status: DC
  Administered 2024-01-03 – 2024-01-05 (×4): 500 mg via INTRAVENOUS
  Filled 2024-01-03 (×4): qty 100

## 2024-01-03 NOTE — Plan of Care (Signed)
  Problem: Clinical Measurements: Goal: Diagnostic test results will improve Outcome: Progressing Goal: Signs and symptoms of infection will decrease Outcome: Progressing   Problem: Respiratory: Goal: Ability to maintain adequate ventilation will improve Outcome: Progressing   Problem: Education: Goal: Knowledge of General Education information will improve Description: Including pain rating scale, medication(s)/side effects and non-pharmacologic comfort measures Outcome: Progressing

## 2024-01-03 NOTE — Progress Notes (Signed)
  Progress Note   Patient: Bryan Cabrera WGN:[id number] DOB: 12/04/1940 DOA: 01/01/2024     2 DOS: the patient was seen and examined on 01/03/2024   Brief hospital course:  Pt is a 83 y.o. male with medical history significant for atrial fibrillation, hypertension, dyslipidemia, OSA, osteoarthritis and vertigo, who presented to the emergency room with acute onset of recurrent nausea and vomiting over the last 3 days with associated left lower quadrant abdominal pain. CT scan revealed inflammatory stranding adjacent to the distal descending colon and proximal sigmoid colon compatible with active diverticulitis.   Assessment and Plan:  Acute diverticulitis - Currently receiving IV ceftriaxone plus Flagyl.  Appears to be showing improvement.  Leukocytosis resolved.  Abdominal pain improved but still persistent.  Tolerating clear liquid diet.  Will advance diet.  Concern for sepsis - Leukopenia, tachycardia, tachypnea on presentation given concern for sepsis.  Aggressive IV fluid hydration, antibiotic therapy, blood cultures.  Monitor blood pressure closely.  Hypokalemia - Showing improvement after replenishment.  Will recheck BMP in AM.  Hypertension/hyperlipidemia/CAD - Resume home medication regimen  Atrial fibrillation, chronic - continue Eliquis.  monitoring for bleeding with diverticulitis.      Subjective: Patient feeling improved this morning.  Rates his abdominal pain as a 5 out of 10.  Tolerating clear liquid diet.  Denies fever, chills, shortness of breath, chest pain, nausea, vomiting, diarrhea.  Physical Exam: Vitals:   01/02/24 2133 01/03/24 0145 01/03/24 0458 01/03/24 0919  BP: 131/68 115/75 (!) 141/77 138/62  Pulse: 93 94 96 93  Resp: (!) 22 16 20    Temp: 98.7 F (37.1 C) 98.6 F (37 C) 98.9 F (37.2 C)   TempSrc:      SpO2: 96% 99% 98% 96%  Weight:      Height:       GENERAL:  Alert, pleasant, no acute distress  HEENT:  EOMI CARDIOVASCULAR:  RRR, no murmurs  appreciated RESPIRATORY:  Clear to auscultation, no wheezing, rales, or rhonchi GASTROINTESTINAL:  Soft, mild tenderness LLQ, normally distended  EXTREMITIES:  No LE edema bilaterally NEURO:  No new focal deficits appreciated SKIN:  No rashes noted PSYCH:  Appropriate mood and affect   Data Reviewed:  There are no new results to review at this time.  Family Communication: None  Disposition: Status is: Inpatient Remains inpatient appropriate because: Diverticulitis requiring IV antibiotics  Planned Discharge Destination: Home    Time spent: 35 minutes  Author: Deanna Artis, DO 01/03/2024 12:27 PM  For on call review [email or web address].

## 2024-01-03 NOTE — Plan of Care (Signed)
°  Problem: Fluid Volume: °Goal: Hemodynamic stability will improve °Outcome: Progressing °  °Problem: Clinical Measurements: °Goal: Diagnostic test results will improve °Outcome: Progressing °Goal: Signs and symptoms of infection will decrease °Outcome: Progressing °  °

## 2024-01-04 DIAGNOSIS — K5792 Diverticulitis of intestine, part unspecified, without perforation or abscess without bleeding: Secondary | ICD-10-CM | POA: Diagnosis not present

## 2024-01-04 LAB — BASIC METABOLIC PANEL
Anion gap: 6 (ref 5–15)
BUN: 11 mg/dL (ref 8–23)
CO2: 21 mmol/L — ABNORMAL LOW (ref 22–32)
Calcium: 8.5 mg/dL — ABNORMAL LOW (ref 8.9–10.3)
Chloride: 110 mmol/L (ref 98–111)
Creatinine, Ser: 0.85 mg/dL (ref 0.61–1.24)
GFR, Estimated: >60 mL/min (ref >60)
Glucose, Bld: 116 mg/dL — ABNORMAL HIGH (ref 70–99)
Potassium: 3.5 mmol/L (ref 3.5–5.1)
Sodium: 137 mmol/L (ref 135–145)

## 2024-01-04 LAB — CBC
HCT: 40.5 % (ref 39.0–52.0)
Hemoglobin: 13.1 g/dL (ref 13.0–17.0)
MCH: 27.7 pg (ref 26.0–34.0)
MCHC: 32.3 g/dL (ref 30.0–36.0)
MCV: 85.6 fL (ref 80.0–100.0)
Platelets: 146 10*3/uL — ABNORMAL LOW (ref 150–400)
RBC: 4.73 MIL/uL (ref 4.22–5.81)
RDW: 13.5 % (ref 11.5–15.5)
WBC: 8.4 10*3/uL (ref 4.0–10.5)
nRBC: 0 % (ref 0.0–0.2)

## 2024-01-04 LAB — MAGNESIUM: Magnesium: 1.9 mg/dL (ref 1.7–2.4)

## 2024-01-04 MED ORDER — OXYCODONE-ACETAMINOPHEN 5-325 MG PO TABS: 1.0000 | ORAL_TABLET | ORAL | Status: DC | PRN

## 2024-01-04 MED ORDER — KETOROLAC TROMETHAMINE 15 MG/ML IJ SOLN
15.0000 mg | Freq: Four times a day (QID) | INTRAMUSCULAR | Status: AC
  Administered 2024-01-04 – 2024-01-05 (×4): 15 mg via INTRAVENOUS
  Filled 2024-01-04 (×4): qty 1

## 2024-01-04 NOTE — Care Management Important Message (Signed)
 Important Message  Patient Details  Name: Bryan Cabrera MRN: [id number] Date of Birth: 07-10-1941   Important Message Given:  Yes - Medicare IM     Corey Harold 01/04/2024, 11:08 AM

## 2024-01-04 NOTE — Plan of Care (Signed)
°  Problem: Fluid Volume: °Goal: Hemodynamic stability will improve °Outcome: Progressing °  °Problem: Clinical Measurements: °Goal: Diagnostic test results will improve °Outcome: Progressing °Goal: Signs and symptoms of infection will decrease °Outcome: Progressing °  °

## 2024-01-04 NOTE — Progress Notes (Signed)
  Progress Note   Patient: Bryan Cabrera GEX:[id number] DOB: 1941-01-07 DOA: 01/01/2024     3 DOS: the patient was seen and examined on 01/04/2024   Brief hospital course:  Pt is a 83 y.o. male with medical history significant for atrial fibrillation, hypertension, dyslipidemia, OSA, osteoarthritis and vertigo, who presented to the emergency room with acute onset of recurrent nausea and vomiting over the last 3 days with associated left lower quadrant abdominal pain. CT scan revealed inflammatory stranding adjacent to the distal descending colon and proximal sigmoid colon compatible with active diverticulitis.   Assessment and Plan:  Acute diverticulitis - Currently receiving IV ceftriaxone plus Flagyl.  Appears to be showing improvement.  Leukocytosis resolved.  Abdominal pain improved but still persistent.  Tolerating clear liquid diet.  Will advance diet to regular diet.  Will add IV Toradol.  As needed p.o. opioids. Concern for sepsis - Leukopenia, tachycardia, tachypnea on presentation given concern for sepsis.  Aggressive IV fluid hydration, antibiotic therapy, blood cultures.  Monitor blood pressure closely.  Hypokalemia - Showing improvement after replenishment.  Will recheck BMP in AM.  Hypertension/hyperlipidemia/CAD - Resume home medication regimen  Atrial fibrillation, chronic - continue Eliquis.  monitoring for bleeding with diverticulitis.      Subjective: Patient feeling improved this morning.  Still admitting to abdominal pain.  Tolerating advancement in diet.  Denies fever, chills, shortness of breath, chest pain, nausea, vomiting, diarrhea.  States he is getting better but needs likely today before going home.  Physical Exam: Vitals:   01/03/24 0919 01/03/24 1407 01/03/24 2143 01/04/24 0521  BP: 138/62 125/77 (!) 146/82 (!) 148/88  Pulse: 93 87 80 77  Resp:  20 18 20   Temp:  97.8 F (36.6 C) (!) 97.5 F (36.4 C) 98.5 F (36.9 C)  TempSrc:  Oral Oral Oral   SpO2: 96% 97% 99% 97%  Weight:      Height:       GENERAL:  Alert, pleasant, no acute distress  HEENT:  EOMI CARDIOVASCULAR:  RRR, no murmurs appreciated RESPIRATORY:  Clear to auscultation, no wheezing, rales, or rhonchi GASTROINTESTINAL:  Soft, mild tenderness LLQ, normally distended  EXTREMITIES:  No LE edema bilaterally NEURO:  No new focal deficits appreciated SKIN:  No rashes noted PSYCH:  Appropriate mood and affect   Data Reviewed:  There are no new results to review at this time.  Family Communication: None  Disposition: Status is: Inpatient Remains inpatient appropriate because: Diverticulitis requiring IV antibiotics  Planned Discharge Destination: Home    Time spent: 32 minutes  Author: Deanna Artis, DO 01/04/2024 11:01 AM  For on call review [email or web address].

## 2024-01-05 ENCOUNTER — Other Ambulatory Visit: Payer: Self-pay | Admitting: Internal Medicine

## 2024-01-05 DIAGNOSIS — K5792 Diverticulitis of intestine, part unspecified, without perforation or abscess without bleeding: Secondary | ICD-10-CM | POA: Diagnosis not present

## 2024-01-05 MED ORDER — CEFDINIR 300 MG PO CAPS
300.0000 mg | ORAL_CAPSULE | Freq: Two times a day (BID) | ORAL | 0 refills | Status: DC
Start: 2024-01-05 — End: 2024-01-30

## 2024-01-05 MED ORDER — OXYCODONE-ACETAMINOPHEN 5-325 MG PO TABS: 1.0000 | ORAL_TABLET | Freq: Four times a day (QID) | ORAL | 0 refills | Status: DC | PRN

## 2024-01-05 MED ORDER — AMOXICILLIN-POT CLAVULANATE 875-125 MG PO TABS: 1.0000 | ORAL_TABLET | Freq: Two times a day (BID) | ORAL | 0 refills | Status: DC

## 2024-01-05 MED ORDER — HYDRALAZINE HCL 20 MG/ML IJ SOLN: 10.0000 mg | Freq: Once | INTRAMUSCULAR | Status: DC

## 2024-01-05 MED ORDER — METRONIDAZOLE 500 MG PO TABS: 500.0000 mg | ORAL_TABLET | Freq: Three times a day (TID) | ORAL | 0 refills | Status: AC

## 2024-01-05 NOTE — Discharge Summary (Addendum)
 Physician Discharge Summary   Patient: Bryan Cabrera MRN: [id number] DOB: 1941/05/22  Admit date:     01/01/2024  Discharge date: 01/05/24  Discharge Physician: Deanna Artis   PCP: [name], MD   Recommendations at discharge:   ADDENDUM:  Discharge antibiotics changed from Augmentin to Cefdinir plus Flagyl.  At this time patient will be discharged home.  If you experience any symptoms such as fever, vomiting, shortness of breath, chest pain, abdominal pain, or other concerning symptoms, please call your primary care provider or go to the emergency department immediately.  Discharge Diagnoses: Principal Problem:   Acute diverticulitis Active Problems:   Sepsis due to undetermined organism (HCC)   Hypokalemia   Essential hypertension   Coronary artery disease   Atrial fibrillation, chronic (HCC)   Dyslipidemia  Resolved Problems:   * No resolved hospital problems. *  Hospital Course:  Pt is a 83 y.o. male with medical history significant for atrial fibrillation, hypertension, dyslipidemia, OSA, osteoarthritis and vertigo, who presented to the emergency room with acute onset of recurrent nausea and vomiting over the last 3 days with associated left lower quadrant abdominal pain. CT scan revealed inflammatory stranding adjacent to the distal descending colon and proximal sigmoid colon compatible with active diverticulitis.    Assessment and Plan:  Acute diverticulitis - CT scan noting inflammation suggestive of diverticulitis.  Responded well to IV ceftriaxone plus Flagyl.  Appears to be showing improvement.  Leukocytosis resolved.  Abdominal pain improved.  Tolerated advancement to regular diet.  Will transition patient to p.o. Augmentin to take as directed upon discharge.  Will also give short course of opioids.    Concern for sepsis - Leukopenia, tachycardia, tachypnea on presentation given concern for sepsis.  Aggressive IV fluid hydration, antibiotic therapy, blood  cultures.  Cultures show NGTD.  Appears resolved.  Hypokalemia - Showing improvement after replenishment.  Resolved  Hypertension/hyperlipidemia/CAD - Resume home medication regimen   Atrial fibrillation, chronic - continue Eliquis.    Physical debilitation and muscle weakness - Exacerbated by advanced age and hospital stay.  Evaluated by physical therapy.  Recommending home health PT.       Pain control - [employer] Controlled Substance Reporting System database was reviewed. and patient was instructed, not to drive, operate heavy machinery, perform activities at heights, swimming or participation in water activities or provide baby-sitting services while on Pain, Sleep and Anxiety Medications; until their outpatient Physician has advised to do so again. Also recommended to not to take more than prescribed Pain, Sleep and Anxiety Medications.  Consultants: None Procedures performed: None Disposition: Home health Diet recommendation:  Discharge Diet Orders (From admission, onward)     Start     Ordered   01/05/24 0000  Diet - low sodium heart healthy        01/05/24 1023           Cardiac diet DISCHARGE MEDICATION: Allergies as of 01/05/2024       Reactions   Ciprofloxacin Nausea Only, Other (See Comments)   Dizziness   Metronidazole Nausea Only, Other (See Comments)   Dizziness   Penicillins Other (See Comments)   Family hx of allergy   Quinapril Hcl Cough   Tetracycline Hcl Nausea And Vomiting, Other (See Comments)   Vomiting with illness   Amoxicillin Other (See Comments)   Family hx of allergy        Medication List     TAKE these medications    acetaminophen 325 MG  tablet Commonly known as: TYLENOL Take 2 tablets (650 mg total) by mouth every 6 (six) hours as needed for mild pain (pain score 1-3), fever or headache.   apixaban 5 MG Tabs tablet Commonly known as: Eliquis Take 1 tablet (5 mg total) by mouth 2 (two) times daily.   hydrocortisone  2.5 % cream Apply topically.   isosorbide mononitrate 30 MG 24 hr tablet Commonly known as: IMDUR TAKE ONE TABLET (30MG  TOTAL) BY MOUTH DAILY   latanoprost 0.005 % ophthalmic solution Commonly known as: XALATAN Place 1 drop into both eyes at bedtime.   meclizine 25 MG tablet Commonly known as: ANTIVERT Take 1 tablet (25 mg total) by mouth 3 (three) times daily as needed for dizziness.   nitroGLYCERIN 0.4 MG SL tablet Commonly known as: NITROSTAT PLACE ONE TONGUE (0.4 MG TOTAL) UNDER THE TONGUE EVERY 5 MINUTES AS NEEDED FORCHEST PAIN. What changed: See the new instructions.   olmesartan 40 MG tablet Commonly known as: BENICAR Take 40 mg by mouth daily.   oxyCODONE-acetaminophen 5-325 MG tablet Commonly known as: PERCOCET/ROXICET Take 1 tablet by mouth every 6 (six) hours as needed for severe pain (pain score 7-10).   rosuvastatin 20 MG tablet Commonly known as: CRESTOR Take 1 tablet (20 mg total) by mouth daily.        Discharge Exam: Filed Weights   01/01/24 1916  Weight: 90 kg   GENERAL:  Alert, pleasant, no acute distress  HEENT:  EOMI CARDIOVASCULAR:  RRR, no murmurs appreciated RESPIRATORY:  Clear to auscultation, no wheezing, rales, or rhonchi GASTROINTESTINAL:  Soft, mild tenderness LLQ, normally distended  EXTREMITIES:  No LE edema bilaterally NEURO:  No new focal deficits appreciated SKIN:  No rashes noted PSYCH:  Appropriate mood and affect   Condition at discharge: improving  The results of significant diagnostics from this hospitalization (including imaging, microbiology, ancillary and laboratory) are listed below for reference.   Imaging Studies: DG Chest 1 View Result Date: 01/01/2024 CLINICAL DATA:  Cough EXAM: CHEST  1 VIEW COMPARISON:  09/30/2023 FINDINGS: Heart and mediastinal contours are within normal limits. No focal opacities or effusions. No acute bony abnormality. IMPRESSION: No active disease. Electronically Signed   By: Charlett Nose  M.D.   On: 01/01/2024 23:16   CT ABDOMEN PELVIS W CONTRAST Result Date: 01/01/2024 CLINICAL DATA:  Abdominal pain, nausea, vomiting EXAM: CT ABDOMEN AND PELVIS WITH CONTRAST TECHNIQUE: Multidetector CT imaging of the abdomen and pelvis was performed using the standard protocol following bolus administration of intravenous contrast. RADIATION DOSE REDUCTION: This exam was performed according to the departmental dose-optimization program which includes automated exposure control, adjustment of the mA and/or kV according to patient size and/or use of iterative reconstruction technique. CONTRAST:  OMNIPAQUE IOHEXOL 300 MG/ML  SOLN COMPARISON:  09/24/2023 FINDINGS: Lower chest: Dense calcifications in the visualized right coronary artery. Dependent atelectasis. No effusions. Small hiatal hernia. Hepatobiliary: No focal hepatic abnormality. Gallbladder unremarkable. Pancreas: No focal abnormality or ductal dilatation. Fatty replacement. Spleen: No focal abnormality.  Normal size. Adrenals/Urinary Tract: Adrenal glands unremarkable. Bilateral perinephric stranding is increased since prior study. No renal or ureteral stones. No hydronephrosis. Urinary bladder unremarkable. Stomach/Bowel: Left colonic diverticulosis. Inflammatory stranding adjacent to the distal descending colon/proximal sigmoid colon compatible with active diverticulitis. Stomach and small bowel decompressed. No bowel obstruction. Vascular/Lymphatic: Aortic atherosclerosis. No evidence of aneurysm or adenopathy. Reproductive: Mildly prominent prostate with central calcifications. Other: No free fluid or free air. Left inguinal hernia contains fat. Musculoskeletal: No acute  bony abnormality. IMPRESSION: Left colonic diverticulosis. Inflammatory stranding adjacent to the distal descending colon and proximal sigmoid colon compatible with active diverticulitis. No complicating feature. Coronary artery disease, aortic atherosclerosis. Electronically  Signed   By: Charlett Nose M.D.   On: 01/01/2024 21:50    Microbiology: Results for orders placed or performed during the hospital encounter of 01/01/24  Resp panel by RT-PCR (RSV, Flu A&B, Covid) Anterior Nasal Swab     Status: None   Collection Time: 01/01/24  8:25 PM   Specimen: Anterior Nasal Swab  Result Value Ref Range Status   SARS Coronavirus 2 by RT PCR NEGATIVE NEGATIVE Final    Comment: (NOTE) SARS-CoV-2 target nucleic acids are NOT DETECTED.  The SARS-CoV-2 RNA is generally detectable in upper respiratory specimens during the acute phase of infection. The lowest concentration of SARS-CoV-2 viral copies this assay can detect is 138 copies/mL. A negative result does not preclude SARS-Cov-2 infection and should not be used as the sole basis for treatment or other patient management decisions. A negative result may occur with  improper specimen collection/handling, submission of specimen other than nasopharyngeal swab, presence of viral mutation(s) within the areas targeted by this assay, and inadequate number of viral copies(<138 copies/mL). A negative result must be combined with clinical observations, patient history, and epidemiological information. The expected result is Negative.  Fact Sheet for Patients:  BloggerCourse.com  Fact Sheet for Healthcare Providers:  SeriousBroker.it  This test is no t yet approved or cleared by the [place] FDA and  has been authorized for detection and/or diagnosis of SARS-CoV-2 by FDA under an Emergency Use Authorization (EUA). This EUA will remain  in effect (meaning this test can be used) for the duration of the COVID-19 declaration under Section 564(b)(1) of the Act, 21 U.S.C.section 360bbb-3(b)(1), unless the authorization is terminated  or revoked sooner.       Influenza A by PCR NEGATIVE NEGATIVE Final   Influenza B by PCR NEGATIVE NEGATIVE Final    Comment: (NOTE) The  Xpert Xpress SARS-CoV-2/FLU/RSV plus assay is intended as an aid in the diagnosis of influenza from Nasopharyngeal swab specimens and should not be used as a sole basis for treatment. Nasal washings and aspirates are unacceptable for Xpert Xpress SARS-CoV-2/FLU/RSV testing.  Fact Sheet for Patients: BloggerCourse.com  Fact Sheet for Healthcare Providers: SeriousBroker.it  This test is not yet approved or cleared by the [place] FDA and has been authorized for detection and/or diagnosis of SARS-CoV-2 by FDA under an Emergency Use Authorization (EUA). This EUA will remain in effect (meaning this test can be used) for the duration of the COVID-19 declaration under Section 564(b)(1) of the Act, 21 U.S.C. section 360bbb-3(b)(1), unless the authorization is terminated or revoked.     Resp Syncytial Virus by PCR NEGATIVE NEGATIVE Final    Comment: (NOTE) Fact Sheet for Patients: BloggerCourse.com  Fact Sheet for Healthcare Providers: SeriousBroker.it  This test is not yet approved or cleared by the [place] FDA and has been authorized for detection and/or diagnosis of SARS-CoV-2 by FDA under an Emergency Use Authorization (EUA). This EUA will remain in effect (meaning this test can be used) for the duration of the COVID-19 declaration under Section 564(b)(1) of the Act, 21 U.S.C. section 360bbb-3(b)(1), unless the authorization is terminated or revoked.  Performed at [facility], [address] [place], [place] [address]     Labs: CBC: Recent Labs  Lab 01/01/24 1950 01/02/24 0542 01/03/24 0442 01/04/24 0425  WBC 2.9* 16.7*  9.0 8.4  NEUTROABS 2.3  --   --   --   HGB 15.0 14.2 12.8* 13.1  HCT 46.2 44.1 39.8 40.5  MCV 85.9 86.1 87.1 85.6  PLT 153 156 126* 146*   Basic Metabolic Panel: Recent Labs  Lab 01/01/24 1950 01/02/24 0542 01/03/24 0442 01/04/24 0425   NA 140 141 138 137  K 3.4* 3.7 3.5 3.5  CL 107 110 108 110  CO2 21* 20* 24 21*  GLUCOSE 151* 137* 105* 116*  BUN 16 16 15 11   CREATININE 1.20 1.21 0.94 0.85  CALCIUM 9.6 9.1 8.9 8.5*  MG  --   --   --  1.9   Liver Function Tests: Recent Labs  Lab 01/01/24 1950  AST 25  ALT 21  ALKPHOS 67  BILITOT 1.2  PROT 7.0  ALBUMIN 4.0   CBG: No results for input(s): "GLUCAP" in the last 168 hours.  Discharge time spent: less than 30 minutes.  Signed: Deanna Artis, DO [facility] 01/05/2024

## 2024-01-05 NOTE — TOC Transition Note (Signed)
 Transition of Care [facility] - Discharge Note   Patient Details  Name: Bryan Cabrera MRN: [id number] Date of Birth: Aug 31, 1941  [facility]) CM/SW Contact:  Villa Herb, LCSWA Phone Number: 01/05/2024, 1:35 PM   Clinical Narrative:    CSW noted that PT is recommending HH PT for pt at D/C. CSW spoke with pt who states he is agreeable to this and has worked with [facility] in the past. CSW gave referral to [facility], CSW updated that due to staffing they are unable to accept referral at this time. CSW spoke to [place] with [facility] who states they can accept [facility] referral and will follow pt in the community. MD placed HH orders. TOC signing off.   Final next level of care: Home w Home Health Services Barriers to Discharge: Barriers Resolved   Patient Goals and CMS Choice Patient states their goals for this hospitalization and ongoing recovery are:: return home CMS Medicare.gov Compare Post Acute Care list provided to:: Patient Choice offered to / list presented to : Patient      Discharge Placement                       Discharge Plan and Services Additional resources added to the After Visit Summary for                            [facility] Arranged: PT, OT [facility] Agency: [facility] Date [facility] Agency Contacted: 01/05/24   Representative spoke with at [facility] Agency: Kandee Keen  Social Drivers of Health (SDOH) Interventions SDOH Screenings   Food Insecurity: No Food Insecurity (01/02/2024)  Housing: Low Risk  (01/02/2024)  Transportation Needs: No Transportation Needs (01/02/2024)  Utilities: Not At Risk (01/02/2024)  Alcohol Screen: Low Risk  (11/29/2022)  Depression (PHQ2-9): Low Risk  (06/06/2023)  Financial Resource Strain: Low Risk  (11/29/2022)  Physical Activity: Inactive (11/29/2022)  Social Connections: Moderately Isolated (01/02/2024)  Stress: No Stress Concern Present (11/29/2022)  Tobacco Use: Medium Risk (01/01/2024)     Readmission Risk Interventions     01/05/2024    1:34 PM  Readmission Risk Prevention Plan  Medication Screening Complete  Transportation Screening Complete

## 2024-01-05 NOTE — Progress Notes (Signed)
 Patient discharged home. All questions and concerns answered. Patient has all personal belongings, including discharge paperwork. Patient wheeled down to POV.

## 2024-01-05 NOTE — Progress Notes (Signed)
 Mobility Specialist Progress Note:    01/05/24 1135  Mobility  Activity Ambulated with assistance in hallway  Level of Assistance Standby assist, set-up cues, supervision of patient - no hands on  Assistive Device Front wheel walker  Distance Ambulated (ft) 80 ft  Range of Motion/Exercises Active;All extremities  Activity Response Tolerated well  Mobility Referral Yes  Mobility visit 1 Mobility  Mobility Specialist Start Time (ACUTE ONLY) 1135  Mobility Specialist Stop Time (ACUTE ONLY) 1155  Mobility Specialist Time Calculation (min) (ACUTE ONLY) 20 min   Pt received in chair, agreeable to mobility. Required SBA to stand and ambulate with RW. Tolerated well, SpO2 98% on RA at rest. SpO2 95% during ambulation. Returned pt to chair, call bell in reach. All needs met.  Bryan Cabrera Mobility Specialist Please contact via Special educational needs teacher or  Rehab office at [phone]

## 2024-01-05 NOTE — Evaluation (Signed)
 Physical Therapy Evaluation Patient Details Name: Bryan Cabrera MRN: [id number] DOB: Mar 30, 1941 Today's Date: 01/05/2024  History of Present Illness  Bryan Cabrera is a 83 y.o. male with medical history significant for atrial fibrillation, hypertension, dyslipidemia, OSA, osteoarthritis and vertigo, who presented to the emergency room with acute onset of recurrent nausea and vomiting over the last 3 days with associated left lower quadrant abdominal pain.  The patient denied any bilious vomitus or hematemesis.  No dysuria, oliguria.  Dysuria or flank pain.  No cough or wheezing or dyspnea.  No chest pain or palpitations.    Clinical Impression  Patient sitting up in chair on therapist arrival; assisted there by nursing.  Patient states he needs to go to the bathroom so PT assists him with sit to stand with RW and CGA due to leg weakness and he walks x 10 ft to bathroom; noted decreased gait speed and slight forward flexed trunk; needs CGA for safety and has to use grab bar to control his descent to toilet.  Patient asks to "sit a while".  PT instructed patient to use pull cord to notify nursing when his done for assist after toileting.  PT notified nursing of above. patient left on toilet with pull cord in reach and nursing notified of mobility status. Patient will benefit from continued skilled therapy services during the remainder of his hospital stay and at the next recommended venue of care to address deficits and promote return to optimal function.           If plan is discharge home, recommend the following: A little help with walking and/or transfers;A little help with bathing/dressing/bathroom;Assistance with cooking/housework   Can travel by private vehicle        Equipment Recommendations None recommended by PT  Recommendations for Other Services       Functional Status Assessment Patient has had a recent decline in their functional status and demonstrates the ability to make  significant improvements in function in a reasonable and predictable amount of time.     Precautions / Restrictions Precautions Precautions: Fall Recall of Precautions/Restrictions: Intact Precaution/Restrictions Comments: use of RW for safety Restrictions Weight Bearing Restrictions Per Provider Order: No      Mobility  Bed Mobility               General bed mobility comments: sitting up in chair on therapist arrival.  assisted there by nursing    Transfers Overall transfer level: Needs assistance Equipment used: Rolling walker (2 wheels) Transfers: Sit to/from Stand Sit to Stand: Contact guard assist           General transfer comment: takes extra time with sit to stand    Ambulation/Gait Ambulation/Gait assistance: Contact guard assist Gait Distance (Feet): 10 Feet Assistive device: Rolling walker (2 wheels) Gait Pattern/deviations: Trunk flexed, Decreased step length - left, Decreased step length - right Gait velocity: decreased     General Gait Details: ambulation from chair to bathroom with RW and CGA  Stairs            Wheelchair Mobility     Tilt Bed    Modified Rankin (Stroke Patients Only)       Balance Overall balance assessment: Needs assistance Sitting-balance support: No upper extremity supported, Feet supported Sitting balance-Leahy Scale: Good Sitting balance - Comments: good sitting balance on edge of chair   Standing balance support: Bilateral upper extremity supported, Reliant on assistive device for balance, During functional activity Standing balance-Leahy Scale:  Fair Standing balance comment: fair to good standing balance with RW and CGA                             Pertinent Vitals/Pain Pain Assessment Pain Assessment: 0-10 Pain Score: 0-No pain Pain Location: no pain after taking pain medication this morning Pain Intervention(s): Limited activity within patient's tolerance, Monitored during session,  Premedicated before session, Repositioned    Home Living Family/patient expects to be discharged to:: Private residence Living Arrangements: Spouse/significant other Available Help at Discharge: Available PRN/intermittently;Family Type of Home: House Home Access: Ramped entrance     Alternate Level Stairs-Number of Steps: ranch house with basement. Home Layout: Two level Home Equipment: Cane - single point;Tub bench;Rolling Walker (2 wheels);Grab bars - tub/shower;Grab bars - toilet Additional Comments: patient is primary caregiver for his wife who is WC bound    Prior Function Prior Level of Function : Independent/Modified Independent;Working/employed             Mobility Comments: recently limited to household ambulation using RW, was working fulltime as Soil scientist, community ambulation with SPC, drives ADLs Comments: Independent, was helping to take care of his spouse doing the cooking and cleaning     Extremity/Trunk Assessment   Upper Extremity Assessment Upper Extremity Assessment: Right hand dominant    Lower Extremity Assessment Lower Extremity Assessment: Generalized weakness    Cervical / Trunk Assessment Cervical / Trunk Assessment: Kyphotic  Communication   Communication Communication: No apparent difficulties    Cognition Arousal: Alert Behavior During Therapy: WFL for tasks assessed/performed   PT - Cognitive impairments: No apparent impairments                         Following commands: Intact       Cueing       General Comments      Exercises     Assessment/Plan    PT Assessment Patient needs continued PT services  PT Problem List Decreased strength;Decreased balance;Decreased mobility;Pain       PT Treatment Interventions Gait training;Functional mobility training;Therapeutic activities;Therapeutic exercise;Balance training;Patient/family education    PT Goals (Current goals can be found in the Care Plan section)   Acute Rehab PT Goals Patient Stated Goal: return home PT Goal Formulation: With patient Time For Goal Achievement: 01/19/24 Potential to Achieve Goals: Good    Frequency Min 2X/week     Co-evaluation               AM-PAC PT "6 Clicks" Mobility  Outcome Measure Help needed turning from your back to your side while in a flat bed without using bedrails?: None Help needed moving from lying on your back to sitting on the side of a flat bed without using bedrails?: A Little Help needed moving to and from a bed to a chair (including a wheelchair)?: A Little Help needed standing up from a chair using your arms (e.g., wheelchair or bedside chair)?: A Little Help needed to walk in hospital room?: A Little Help needed climbing 3-5 steps with a railing? : A Little 6 Click Score: 19    End of Session   Activity Tolerance: Patient tolerated treatment well;Patient limited by fatigue Patient left: with call bell/phone within reach;Other (comment) (patient left on toilet per his request to "sit a while".  instructed to use pull cord to have assistance when done toileting) Nurse Communication: Mobility status PT Visit Diagnosis: Muscle  weakness (generalized) (M62.81);Other abnormalities of gait and mobility (R26.89);Unsteadiness on feet (R26.81)    Time: 1610-9604 PT Time Calculation (min) (ACUTE ONLY): 30 min   Charges:   PT Evaluation $PT Eval Moderate Complexity: 1 Mod   PT General Charges $$ ACUTE PT VISIT: 1 Visit         10:13 AM, 01/05/24 Adleigh Mcmasters Small Tarek Cravens MPT [place] [facility] [place] [phone] Ph:[phone]

## 2024-01-05 NOTE — Plan of Care (Signed)
  Problem: Acute Rehab PT Goals(only PT should resolve) Goal: Pt Will Go Supine/Side To Sit Outcome: Progressing Flowsheets (Taken 01/05/2024 1014) Pt will go Supine/Side to Sit: with modified independence Goal: Patient Will Transfer Sit To/From Stand Outcome: Progressing Flowsheets (Taken 01/05/2024 1014) Patient will transfer sit to/from stand: with supervision Goal: Pt Will Transfer Bed To Chair/Chair To Bed Outcome: Progressing Flowsheets (Taken 01/05/2024 1014) Pt will Transfer Bed to Chair/Chair to Bed: with supervision Goal: Pt Will Ambulate Outcome: Progressing Flowsheets (Taken 01/05/2024 1014) Pt will Ambulate:  100 feet  with least restrictive assistive device  with supervision

## 2024-01-05 NOTE — Progress Notes (Signed)
 Mobility Specialist Progress Note:    01/05/24 1305  Mobility  Activity Ambulated with assistance in hallway  Level of Assistance Standby assist, set-up cues, supervision of patient - no hands on  Assistive Device Front wheel walker  Distance Ambulated (ft) 100 ft  Range of Motion/Exercises Active;All extremities  Activity Response Tolerated well  Mobility Referral Yes  Mobility visit 1 Mobility  Mobility Specialist Start Time (ACUTE ONLY) 1305  Mobility Specialist Stop Time (ACUTE ONLY) 1330  Mobility Specialist Time Calculation (min) (ACUTE ONLY) 25 min   Pt received in chair, agreeable to mobility. Required SBA to stand and ambulate with RW. Tolerated well, SpO2 98% on RA at EOS. Returned pt to chair, all needs met.  Djimon Lundstrom Mobility Specialist Please contact via Special educational needs teacher or  Rehab office at [phone]

## 2024-01-07 ENCOUNTER — Telehealth: Payer: Self-pay

## 2024-01-07 NOTE — Transitions of Care (Post Inpatient/ED Visit) (Signed)
 01/07/2024  Name: Bryan Cabrera MRN: [id number] DOB: 1941/08/27  Today's TOC FU Call Status: Today's TOC FU Call Status:: Successful TOC FU Call Completed TOC FU Call Complete Date: 01/07/24 Patient's Name and Date of Birth confirmed.  Transition Care Management Follow-up Telephone Call Date of Discharge: 01/05/24 Discharge [facility] Type of Discharge: Inpatient Admission Primary Inpatient Discharge Diagnosis:: Acute diverticulitis How have you been since you were released from the hospital?: Better (feels much better. Bowels are moving well) Any questions or concerns?: Yes Patient Questions/Concerns:: was prescribed Augemntin and is allergic.  RX changed Patient Questions/Concerns Addressed: Other: (hospitalist changed medications after patient was discharge)  Items Reviewed: Did you receive and understand the discharge instructions provided?: Yes Medications obtained,verified, and reconciled?: Yes (Medications Reviewed) Any new allergies since your discharge?: No Dietary orders reviewed?: Yes Type of Diet Ordered:: heart healthy low sodium diet Do you have support at home?: Yes People in Home: spouse Name of Support/Comfort Primary Source: wife  Medications Reviewed Today: Medications Reviewed Today     Reviewed by Earlie Server, RN (Registered Nurse) on 01/07/24 at 1042  Med List Status: <None>   Medication Order Taking? Sig Documenting Provider Last Dose Status Informant  acetaminophen (TYLENOL) 325 MG tablet [id number] Yes Take 2 tablets (650 mg total) by mouth every 6 (six) hours as needed for mild pain (pain score 1-3), fever or headache. Shon Hale, MD Taking Active Self, Pharmacy Records  apixaban (ELIQUIS) 5 MG TABS tablet [id number] Yes Take 1 tablet (5 mg total) by mouth 2 (two) times daily. Cleora Fleet, MD Taking Active Self, Pharmacy Records  cefdinir (OMNICEF) 300 MG capsule [id number] No Take 1 capsule (300 mg total) by mouth 2 (two)  times daily.  Patient not taking: Reported on 01/07/2024   Deanna Artis, DO Not Taking Active   hydrocortisone 2.5 % cream [id number] Yes Apply topically. [provider] Taking Active Self, Pharmacy Records  isosorbide mononitrate (IMDUR) 30 MG 24 hr tablet [id number] Yes TAKE ONE TABLET (30MG  TOTAL) BY MOUTH DAILY Panosh, [name], MD Taking Active Self, Pharmacy Records  latanoprost (XALATAN) 0.005 % ophthalmic solution [id number] Yes Place 1 drop into both eyes at bedtime. Wynn Banker, MD Taking Active Self, Pharmacy Records  meclizine (ANTIVERT) 25 MG tablet [id number] No Take 1 tablet (25 mg total) by mouth 3 (three) times daily as needed for dizziness.  Patient not taking: Reported on 01/07/2024   Shon Hale, MD Not Taking Active Self, Pharmacy Records  metroNIDAZOLE (FLAGYL) 500 MG tablet [id number] No Take 1 tablet (500 mg total) by mouth 3 (three) times daily for 7 days.  Patient not taking: Reported on 01/07/2024   Deanna Artis, DO Not Taking Active   nitroGLYCERIN (NITROSTAT) 0.4 MG SL tablet [id number] No PLACE ONE TONGUE (0.4 MG TOTAL) UNDER THE TONGUE EVERY 5 MINUTES AS NEEDED FORCHEST PAIN.  Patient not taking: Reported on 01/07/2024   Laurann Montana, [name] Not Taking Active Self, Pharmacy Records  olmesartan [facility]) 40 MG tablet [id number] Yes Take 40 mg by mouth daily. [provider] Taking Active Self, Pharmacy Records  oxyCODONE-acetaminophen (PERCOCET/ROXICET) 5-325 MG tablet [id number] No Take 1 tablet by mouth every 6 (six) hours as needed for severe pain (pain score 7-10).  Patient not taking: Reported on 01/07/2024   Deanna Artis, DO Not Taking Active   rosuvastatin (CRESTOR) 20 MG tablet [id number] Yes Take 1 tablet (20 mg total) by mouth daily. [name], MD Taking  Active Self, Pharmacy Records            Home Care and Equipment/Supplies: Were Home Health Services Ordered?: Yes Name of Home Health Agency::  (Patient reports  that he does not like [facility] and wanted [facility]. Reviewed TOC note and it states that [facility] could not accept due to staffing. Patient inform. Encouraged patient to call [facility] himself since he was a prior patient.) Has Agency set up a time to come to your home?: No EMR reviewed for Home Health Orders: Orders present/patient has not received call (refer to CM for follow-up) (Paitent does not want [facility].  He states he will call [facility] himself) Any new equipment or medical supplies ordered?: No  Functional Questionnaire: Do you need assistance with bathing/showering or dressing?: No Do you need assistance with meal preparation?: No Do you need assistance with eating?: No Do you have difficulty maintaining continence: No Do you need assistance with getting out of bed/getting out of a chair/moving?: No Do you have difficulty managing or taking your medications?: No  Follow up appointments reviewed: PCP Follow-up appointment confirmed?: No (Patient will call DrFabian Sharp and make an appointment) MD Provider Line Number:[phone] Given: No Specialist Hospital Follow-up appointment confirmed?: No Do you need transportation to your follow-up appointment?: No Do you understand care options if your condition(s) worsen?: Yes-patient verbalized understanding  SDOH Interventions Today    Flowsheet Row Most Recent Value  SDOH Interventions   Food Insecurity Interventions Intervention Not Indicated  Housing Interventions Intervention Not Indicated  Transportation Interventions Intervention Not Indicated  Utilities Interventions Intervention Not Indicated     Spoke with patient who reports that he does not have his antibiotics. Reports he was prescribed Augmentin and he is allergic to this medications.  According to EMR, prescription was changed to omicef and flagyl.  Prescriptions was sent to [facility] pharmacy. Confirm that Prescriptions was sent. Patient reports that he will pick  up prescription today.  Patient also states that the hospital did not have Benicar and that his blood pressure was high.  Reports BP last night of 165/90. Reports that he has restarted his home dose of Benicar.  Reviewed with patient that he has no pain today and that he is having good bowel movements.  Reviewed and offer 30 day TOC program and patient has declined. Provided my contact information for patient to call me if needed in the future.    Interventions Today    Flowsheet Row Most Recent Value  Chronic Disease   Chronic disease during today's visit Other  [Acute diverticulitis]  General Interventions   General Interventions Discussed/Reviewed General Interventions Discussed, Doctor Visits  Doctor Visits Discussed/Reviewed Doctor Visits Discussed  Education Interventions   Education Provided Provided Education  [Encouraged patient to pick up his new antibiotics asap. Reviewed importance of timely follow up with PCP]  Provided Verbal Education On Nutrition, Medication, Insurance Plans  [Encouraged patient to call [employer] if needed for transportation services. He states that he has friends that can assist as well.]  Pharmacy Interventions   Pharmacy Dicussed/Reviewed Medications and their functions, Medication Adherence      TOC Interventions Today    Flowsheet Row Most Recent Value  TOC Interventions   TOC Interventions Discussed/Reviewed TOC Interventions Discussed, TOC Interventions Reviewed, S/S of infection, Post discharge activity limitations per provider       Lonia Chimera, RN, BSN, CEN Population Health- Transition of Care Team.  [facility] [phone]
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
## 2024-01-10 ENCOUNTER — Ambulatory Visit: Admitting: Internal Medicine

## 2024-01-10 ENCOUNTER — Encounter: Payer: Self-pay | Admitting: Internal Medicine

## 2024-01-10 VITALS — BP 138/84 | HR 63 | Temp 97.7°F | Wt 197.0 lb

## 2024-01-10 DIAGNOSIS — R4781 Slurred speech: Secondary | ICD-10-CM

## 2024-01-10 DIAGNOSIS — I10 Essential (primary) hypertension: Secondary | ICD-10-CM | POA: Diagnosis not present

## 2024-01-10 DIAGNOSIS — K5792 Diverticulitis of intestine, part unspecified, without perforation or abscess without bleeding: Secondary | ICD-10-CM | POA: Diagnosis not present

## 2024-01-10 DIAGNOSIS — I2510 Atherosclerotic heart disease of native coronary artery without angina pectoris: Secondary | ICD-10-CM | POA: Diagnosis not present

## 2024-01-10 DIAGNOSIS — Z09 Encounter for follow-up examination after completed treatment for conditions other than malignant neoplasm: Secondary | ICD-10-CM

## 2024-01-10 DIAGNOSIS — Z79899 Other long term (current) drug therapy: Secondary | ICD-10-CM | POA: Diagnosis not present

## 2024-01-10 MED ORDER — AMLODIPINE BESYLATE 2.5 MG PO TABS: 2.5000 mg | ORAL_TABLET | Freq: Every day | ORAL | 0 refills | Status: DC

## 2024-01-10 NOTE — Patient Instructions (Addendum)
 Good to see you today .  Suspect   the swelling could be left over from extra fluid and illness  at hospital.   If Bp is consistently 150 and above over the next 5 days then can begin low dose amlodipine once a day  Have Dr Anne Fu team fu.   Take all antibiotic and plan fu in 1 weeks
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
## 2024-01-16 ENCOUNTER — Ambulatory Visit: Admitting: Internal Medicine

## 2024-01-16 ENCOUNTER — Encounter: Payer: Self-pay | Admitting: Internal Medicine

## 2024-01-16 VITALS — BP 146/72 | HR 71 | Temp 97.6°F | Ht 68.0 in | Wt 195.0 lb

## 2024-01-16 DIAGNOSIS — K5792 Diverticulitis of intestine, part unspecified, without perforation or abscess without bleeding: Secondary | ICD-10-CM | POA: Diagnosis not present

## 2024-01-16 DIAGNOSIS — I10 Essential (primary) hypertension: Secondary | ICD-10-CM | POA: Diagnosis not present

## 2024-01-16 DIAGNOSIS — Z79899 Other long term (current) drug therapy: Secondary | ICD-10-CM | POA: Diagnosis not present

## 2024-01-16 NOTE — Patient Instructions (Signed)
 I think no need for further antibiotic at this time  May take weeks to get energy back   If BP consistently in 140s and above  can add back the amlodipine  ]fall precaution.    Keep June appt .  But if not feeling improving  over hte next 2-3 weeks  or concern can make an earlier appt.

## 2024-01-23 ENCOUNTER — Inpatient Hospital Stay: Admitting: Internal Medicine

## 2024-01-24 ENCOUNTER — Ambulatory Visit: Payer: Self-pay

## 2024-01-24 NOTE — Telephone Encounter (Signed)
 Copied from CRM [id number]. Topic: Clinical - Medication Question >> Jan 24, 2024  3:21 PM Clayton Bibles wrote: Reason for CRM:  He said Dr. Fabian Sharp told him that when he had taken all of the pills of (cefdinir (OMNICEF) 300 MG capsule, he would not need it any more. He feels like he should stay on the medication. He would like a nurse to call him back today to discuss the medication in detail. He believes some of his symptoms are back from his acute diverticulitis. Please call him at [phone]. Thanks   Chief Complaint: abdominal pain Symptoms: LLQ pain and tenderness Frequency: today Pertinent Negatives: Patient denies any other sx present Disposition: [] ED /[] Urgent Care (no appt availability in office) / [] Appointment(In office/virtual)/ []  [facility]/ [] Home Care/ [] Refused Recommended Disposition /[] [facility]/ [x]  Follow-up with PCP Additional Notes: pt is concerned sx of diverticulitis have returned and wondering if needing more abx. He states today moving around and pressing on LLQ he feels slight pain and tenderness again. Reviewed notes from LOV and PCP recommended no abx needed at that time. Advised pt I would send message and have her FU with him for recommendations. Pt verbalized understanding and would like CB in the morning.    Reason for Disposition  [1] MILD-MODERATE pain AND [2] comes and goes (cramps)  Answer Assessment - Initial Assessment Questions 1. LOCATION: "Where does it hurt?"      LLQ abdomen  3. ONSET: "When did the pain begin?" (Minutes, hours or days ago)      Today  5. PATTERN "Does the pain come and go, or is it constant?"    - If it comes and goes: "How long does it last?" "Do you have pain now?"     (Note: Comes and goes means the pain is intermittent. It goes away completely between bouts.)    - If constant: "Is it getting better, staying the same, or getting worse?"      (Note: Constant means the pain never goes away completely;  most serious pain is constant and gets worse.)      Comes and goes  6. SEVERITY: "How bad is the pain?"  (e.g., Scale 1-10; mild, moderate, or severe)    - MILD (1-3): Doesn't interfere with normal activities, abdomen soft and not tender to touch.     - MODERATE (4-7): Interferes with normal activities or awakens from sleep, abdomen tender to touch.     - SEVERE (8-10): Excruciating pain, doubled over, unable to do any normal activities.       Mild  7. RECURRENT SYMPTOM: "Have you ever had this type of stomach pain before?" If Yes, ask: "When was the last time?" and "What happened that time?"      Was recent in hospital and on ABX  8. CAUSE: "What do you think is causing the stomach pain?"     Diverticulitis flare up  9. RELIEVING/AGGRAVATING FACTORS: "What makes it better or worse?" (e.g., antacids, bending or twisting motion, bowel movement)     Not really  10. OTHER SYMPTOMS: "Do you have any other symptoms?" (e.g., back pain, diarrhea, fever, urination pain, vomiting)       Tenderness  Protocols used: Abdominal Pain - Male-A-AH

## 2024-01-25 NOTE — Telephone Encounter (Signed)
 Reach out to pt. Pt reports he is doing fair. Pt states he had finished his antibiotic. Pt reports he feels that  his sx of severe diverticulitis are coming back and trying to stay ahead of situation. Pt states he had one episode of sharp pain on LLQ yesterday and some tenderness. Pt denied of fever, nausea, diarrhea, vomiting. Pt states he still has some tenderness today.  Pt would like to know if Dr. Fabian Sharp think pt should start on new antibiotic.   Offer to schedule an appt at sooner time 4/ 17 at 9:00am. Pt declined and state he has to take his family member to appt. Pt states he would like to hear from Dr. Fabian Sharp. Pt is aware Dr. Fabian Sharp is out of office and will be back on Tuesday.   Pt states he will wait to hear from Dr. Fabian Sharp on Tuesday and will proceed to ED if sx worsen.   Please review and advise.

## 2024-01-29 NOTE — Telephone Encounter (Signed)
 So  it is now  4 days out from  message  What are sx like now?

## 2024-01-29 NOTE — Telephone Encounter (Signed)
 Spoke to pt. Pt reports his sx are about the same. Pt update he has a little dull pain above right hip. Noticed it yesterday. Also pt states when he probe around his around his stomach, he feels no pain.   Update provider. Provider advise for pt to be seen tomorrow at 415pm. Spoke to pt. Pt states he can come in instead of video call as he will be in [place]. Front Desk, [place] helps scheduled for 4:30pm.   No further action is needed.

## 2024-01-30 ENCOUNTER — Ambulatory Visit (INDEPENDENT_AMBULATORY_CARE_PROVIDER_SITE_OTHER): Admitting: Internal Medicine

## 2024-01-30 ENCOUNTER — Encounter: Payer: Self-pay | Admitting: Internal Medicine

## 2024-01-30 VITALS — BP 144/78 | HR 118 | Temp 97.5°F | Wt 193.2 lb

## 2024-01-30 DIAGNOSIS — R10814 Left lower quadrant abdominal tenderness: Secondary | ICD-10-CM | POA: Diagnosis not present

## 2024-01-30 DIAGNOSIS — I10 Essential (primary) hypertension: Secondary | ICD-10-CM | POA: Diagnosis not present

## 2024-01-30 DIAGNOSIS — H40011 Open angle with borderline findings, low risk, right eye: Secondary | ICD-10-CM | POA: Diagnosis not present

## 2024-01-30 DIAGNOSIS — H40012 Open angle with borderline findings, low risk, left eye: Secondary | ICD-10-CM | POA: Diagnosis not present

## 2024-01-30 DIAGNOSIS — Z8719 Personal history of other diseases of the digestive system: Secondary | ICD-10-CM | POA: Diagnosis not present

## 2024-01-30 MED ORDER — CEFDINIR 300 MG PO CAPS: 300.0000 mg | ORAL_CAPSULE | Freq: Two times a day (BID) | ORAL | 0 refills | Status: DC

## 2024-01-30 MED ORDER — METRONIDAZOLE 500 MG PO TABS: 500.0000 mg | ORAL_TABLET | Freq: Three times a day (TID) | ORAL | 0 refills | Status: AC
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
## 2024-01-30 NOTE — Patient Instructions (Signed)
 Restarting a 7 days course of antibiotic   because of mild but increasing sx.  If getting side effect of meds can stop and ask   Want you to have a fu with gastroenterology Dr Riley Cheadle about the  prolonged symptoms and need for hospitalization.   I will send a referral to him and note   If you want  you can also all for appt in interim .

## 2024-02-07 ENCOUNTER — Encounter: Payer: Self-pay | Admitting: *Deleted

## 2024-02-10 ENCOUNTER — Encounter: Payer: Self-pay | Admitting: Gastroenterology
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
## 2024-02-13 ENCOUNTER — Encounter: Payer: Self-pay | Admitting: Gastroenterology

## 2024-02-13 ENCOUNTER — Ambulatory Visit: Admitting: Gastroenterology

## 2024-02-13 VITALS — BP 135/85 | HR 91 | Temp 97.9°F | Ht 68.0 in | Wt 193.4 lb

## 2024-02-13 DIAGNOSIS — Z09 Encounter for follow-up examination after completed treatment for conditions other than malignant neoplasm: Secondary | ICD-10-CM

## 2024-02-13 DIAGNOSIS — Z860101 Personal history of adenomatous and serrated colon polyps: Secondary | ICD-10-CM | POA: Diagnosis not present

## 2024-02-13 DIAGNOSIS — Z860100 Personal history of colon polyps, unspecified: Secondary | ICD-10-CM

## 2024-02-13 DIAGNOSIS — Z8719 Personal history of other diseases of the digestive system: Secondary | ICD-10-CM

## 2024-02-13 NOTE — Patient Instructions (Addendum)
 Let me know if you change your mind and would like to pursue a colonoscopy.   You are free to resume a regular diet.   I recommend you add benefiber daily. You can start with 2 teaspoons daily for 2 weeks, then increase to twice daily.   It was very nice to meet you today!   Shana Daring, [name] [facility] Gastroenterology

## 2024-02-22 ENCOUNTER — Other Ambulatory Visit: Payer: Self-pay | Admitting: Cardiology

## 2024-03-06 ENCOUNTER — Ambulatory Visit: Admitting: Gastroenterology

## 2024-03-14 ENCOUNTER — Other Ambulatory Visit: Payer: Self-pay | Admitting: Cardiology

## 2024-03-14 DIAGNOSIS — I4819 Other persistent atrial fibrillation: Secondary | ICD-10-CM

## 2024-03-14 NOTE — Telephone Encounter (Signed)
 Prescription refill request for Eliquis  received. Indication:afib Last office visit:2/25 Scr:0.85  3/25 Age: 83 Weight:87.7  kg  Prescription refilled

## 2024-03-25 ENCOUNTER — Other Ambulatory Visit: Payer: Self-pay | Admitting: Internal Medicine

## 2024-04-03 ENCOUNTER — Encounter: Payer: Self-pay | Admitting: Internal Medicine

## 2024-04-03 ENCOUNTER — Ambulatory Visit (INDEPENDENT_AMBULATORY_CARE_PROVIDER_SITE_OTHER): Payer: Medicare PPO | Admitting: Internal Medicine

## 2024-04-03 ENCOUNTER — Other Ambulatory Visit: Payer: Self-pay | Admitting: Internal Medicine

## 2024-04-03 VITALS — BP 140/84 | HR 74 | Temp 97.6°F | Ht 68.0 in | Wt 172.8 lb

## 2024-04-03 DIAGNOSIS — I482 Chronic atrial fibrillation, unspecified: Secondary | ICD-10-CM

## 2024-04-03 DIAGNOSIS — Z8719 Personal history of other diseases of the digestive system: Secondary | ICD-10-CM | POA: Diagnosis not present

## 2024-04-03 DIAGNOSIS — Z7901 Long term (current) use of anticoagulants: Secondary | ICD-10-CM

## 2024-04-03 DIAGNOSIS — Z8739 Personal history of other diseases of the musculoskeletal system and connective tissue: Secondary | ICD-10-CM

## 2024-04-03 DIAGNOSIS — E785 Hyperlipidemia, unspecified: Secondary | ICD-10-CM

## 2024-04-03 DIAGNOSIS — Z79899 Other long term (current) drug therapy: Secondary | ICD-10-CM | POA: Diagnosis not present

## 2024-04-03 DIAGNOSIS — Z860100 Personal history of colon polyps, unspecified: Secondary | ICD-10-CM

## 2024-04-03 DIAGNOSIS — I2510 Atherosclerotic heart disease of native coronary artery without angina pectoris: Secondary | ICD-10-CM

## 2024-04-03 DIAGNOSIS — Z955 Presence of coronary angioplasty implant and graft: Secondary | ICD-10-CM

## 2024-04-03 DIAGNOSIS — I10 Essential (primary) hypertension: Secondary | ICD-10-CM

## 2024-04-03 DIAGNOSIS — R7301 Impaired fasting glucose: Secondary | ICD-10-CM

## 2024-04-03 NOTE — Progress Notes (Signed)
 Future orders for CPE in December
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
## 2024-04-03 NOTE — Patient Instructions (Addendum)
 Good to see you today  Agree with bp monitoring   average   below 140  range   If need to begin the amlodipine    let us  know .  Consider  colonoscopy . Aaron Aas  Last  one was 2017  May make appt  with  performing provider and discuss risk benefit    as discussed .   Otherwise December cpe and lab  . Let us  know if want to do lab pre visit or at the visit so we can place orders in system.  Consider shingle vaccine if not done ( any pharmacy) Hg A1c is better 5.9 and not diabetic.

## 2024-06-19 ENCOUNTER — Ambulatory Visit (HOSPITAL_BASED_OUTPATIENT_CLINIC_OR_DEPARTMENT_OTHER): Admitting: Cardiology

## 2024-06-19 ENCOUNTER — Encounter (HOSPITAL_BASED_OUTPATIENT_CLINIC_OR_DEPARTMENT_OTHER): Payer: Self-pay | Admitting: Cardiology

## 2024-06-19 VITALS — BP 136/74 | HR 74 | Ht 68.0 in | Wt 194.8 lb

## 2024-06-19 DIAGNOSIS — G459 Transient cerebral ischemic attack, unspecified: Secondary | ICD-10-CM | POA: Diagnosis not present

## 2024-06-19 DIAGNOSIS — I2510 Atherosclerotic heart disease of native coronary artery without angina pectoris: Secondary | ICD-10-CM

## 2024-06-19 DIAGNOSIS — I493 Ventricular premature depolarization: Secondary | ICD-10-CM | POA: Diagnosis not present

## 2024-06-19 DIAGNOSIS — I4819 Other persistent atrial fibrillation: Secondary | ICD-10-CM

## 2024-06-19 DIAGNOSIS — R001 Bradycardia, unspecified: Secondary | ICD-10-CM | POA: Diagnosis not present

## 2024-06-19 DIAGNOSIS — I4729 Other ventricular tachycardia: Secondary | ICD-10-CM | POA: Diagnosis not present

## 2024-06-19 NOTE — Progress Notes (Signed)
 Cardiology Office Note:  .   Date:  06/19/2024  ID:  Bryan Cabrera, DOB Jun 08, 1941, MRN [id number] PCP: Charlett Apolinar POUR, MD  [facility] Providers Cardiologist:  Oneil Parchment, MD Electrophysiologist:  OLE ONEIDA HOLTS, MD     History of Present Illness: .   Bryan Cabrera is a 83 y.o. male Discussed the use of AI scribe software for clinical note transcription with the patient, who gave verbal consent to proceed.  History of Present Illness Bryan Cabrera is an 83 year old male with coronary artery disease and persistent atrial fibrillation who presents for follow-up.  He has a history of coronary artery disease, with a drug-eluting stent placed in the right coronary artery in February 2023 following an admission for unstable angina. He was initially on dual antiplatelet therapy, which was adjusted to Eliquis  after a transient ischemic attack in March 2023. Since the stent placement, he has had no recent anginal symptoms. Bradycardia led to the discontinuation of metoprolol  and diltiazem .  He has persistent atrial fibrillation, initially diagnosed as paroxysmal in 2019. He experienced eight episodes of non-sustained ventricular tachycardia, with the longest episode lasting five beats. He is currently on Eliquis  for anticoagulation. He experiences easy bruising, stating 'if I look at a place on my body, I get this bruising.'  He was admitted twice in December 2024, once with COVID-19, which significantly impacted his physical strength, requiring a transition from a walker to a cane. He is now attempting to walk without assistance and reports 'COVID twice nearly got me,' as he is slowly regaining strength. During his COVID-19 admission, Eliquis  was held due to Paxlovid  use. He was recommended for skilled nursing facility care due to failure to thrive and patient decline.  His past medical history includes hypertension, hypertriglyceridemia, prior bradycardia, and a history of a hip  seroma/hematoma. He is currently on rosuvastatin  and has restarted amlodipine  2.5 mg, although he prefers not to take it. His blood pressure is reportedly better controlled than at the last visit.    Studies Reviewed: .        Results   Risk Assessment/Calculations:            Physical Exam:   VS:  BP 136/74   Pulse 74   Ht 5' 8 (1.727 m)   Wt 194 lb 12.8 oz (88.4 kg)   SpO2 96%   BMI 29.62 kg/m    Wt Readings from Last 3 Encounters:  06/19/24 194 lb 12.8 oz (88.4 kg)  04/03/24 172 lb 12.8 oz (78.4 kg)  02/13/24 193 lb 6.4 oz (87.7 kg)    GEN: Well nourished, well developed in no acute distress NECK: No JVD; No carotid bruits CARDIAC: Irreg, no murmurs, no rubs, no gallops RESPIRATORY:  Clear to auscultation without rales, wheezing or rhonchi  ABDOMEN: Soft, non-tender, non-distended EXTREMITIES:  No edema; No deformity   ASSESSMENT AND PLAN: .    Assessment and Plan Assessment & Plan Persistent atrial fibrillation with prior bradycardic response Persistent atrial fibrillation is well-controlled with a heart rate of 79 bpm. Previously experienced bradycardia necessitated discontinuation of AV nodal blocking agents. Currently managed with Eliquis , which is important for stroke prevention despite causing bruising. Discussion about the Watchman procedure was revisited, but it was decided to defer until he is more stable. - Continue Eliquis  5 MG oral bid - Discuss/revisit potential Watchman procedure in the future if stable  Coronary artery disease, status post drug-eluting stent to RCA Coronary artery disease is  well-controlled post-drug-eluting stent placement in the RCA in February 2023. Dual antiplatelet therapy was initially started but now only on Eliquis  and rosuvastatin . - Continue rosuvastatin  20 MG oral daily - Continue isosorbide  mononitrate 30 MG oral daily for anginal symptoms  Primary hypertension Blood pressure is better controlled compared to the last  visit. Managed with olmesartan  and amlodipine , although he prefers not to take amlodipine . - Continue olmesartan  40 MG oral daily  History of transient ischemic attack (TIA) History of TIA in March 2023. Currently on Eliquis  for stroke prevention. - Continue Eliquis  5 MG oral bid  History of COVID-19 infection with functional decline Experienced significant functional decline following COVID-19 infection, requiring skilled nursing facility care. Currently regaining strength and mobility, progressing from walker to cane.  History of non-sustained ventricular tachycardia and PVCs History of non-sustained ventricular tachycardia and PVCs noted on monitor. Longest episode was five beats. Currently not on AV nodal blocking agents due to prior bradycardia.  Echo EF 65 to 70%.  Stable.         Signed, Oneil Parchment, MD

## 2024-06-19 NOTE — Patient Instructions (Signed)
 Medication Instructions:  The current medical regimen is effective;  continue present plan and medications.  *If you need a refill on your cardiac medications before your next appointment, please call your pharmacy*   Follow-Up: At [facility], you and your health needs are our priority.  As part of our continuing mission to provide you with exceptional heart care, our providers are all part of one team.  This team includes your primary Cardiologist (physician) and Advanced Practice Providers or APPs (Physician Assistants and Nurse Practitioners) who all work together to provide you with the care you need, when you need it.  Your next appointment:   6 month(s)  Provider:   Dayna Dunn, [name]      Then, Oneil Parchment, MD will plan to see you again in 1 year(s).    We recommend signing up for the patient portal called MyChart.  Sign up information is provided on this After Visit Summary.  MyChart is used to connect with patients for Virtual Visits (Telemedicine).  Patients are able to view lab/test results, encounter notes, upcoming appointments, etc.  Non-urgent messages can be sent to your provider as well.   To learn more about what you can do with MyChart, go to [email or web address].

## 2024-06-24 DIAGNOSIS — Z85828 Personal history of other malignant neoplasm of skin: Secondary | ICD-10-CM | POA: Diagnosis not present

## 2024-06-24 DIAGNOSIS — L308 Other specified dermatitis: Secondary | ICD-10-CM | POA: Diagnosis not present

## 2024-06-24 DIAGNOSIS — L218 Other seborrheic dermatitis: Secondary | ICD-10-CM | POA: Diagnosis not present

## 2024-06-24 DIAGNOSIS — L570 Actinic keratosis: Secondary | ICD-10-CM | POA: Diagnosis not present

## 2024-07-31 DIAGNOSIS — H353131 Nonexudative age-related macular degeneration, bilateral, early dry stage: Secondary | ICD-10-CM | POA: Diagnosis not present

## 2024-07-31 DIAGNOSIS — H26493 Other secondary cataract, bilateral: Secondary | ICD-10-CM | POA: Diagnosis not present

## 2024-07-31 DIAGNOSIS — H401121 Primary open-angle glaucoma, left eye, mild stage: Secondary | ICD-10-CM | POA: Diagnosis not present

## 2024-07-31 DIAGNOSIS — H57813 Brow ptosis, bilateral: Secondary | ICD-10-CM | POA: Diagnosis not present

## 2024-07-31 DIAGNOSIS — H35373 Puckering of macula, bilateral: Secondary | ICD-10-CM | POA: Diagnosis not present

## 2024-07-31 DIAGNOSIS — H40021 Open angle with borderline findings, high risk, right eye: Secondary | ICD-10-CM | POA: Diagnosis not present

## 2024-07-31 DIAGNOSIS — H02834 Dermatochalasis of left upper eyelid: Secondary | ICD-10-CM | POA: Diagnosis not present

## 2024-07-31 DIAGNOSIS — H02831 Dermatochalasis of right upper eyelid: Secondary | ICD-10-CM | POA: Diagnosis not present

## 2024-09-19 ENCOUNTER — Other Ambulatory Visit: Payer: Self-pay | Admitting: *Deleted

## 2024-09-19 DIAGNOSIS — I4819 Other persistent atrial fibrillation: Secondary | ICD-10-CM

## 2024-09-19 MED ORDER — APIXABAN 5 MG PO TABS: 5.0000 mg | ORAL_TABLET | Freq: Two times a day (BID) | ORAL | 5 refills | Status: AC

## 2024-09-19 NOTE — Telephone Encounter (Signed)
 Eliquis  5mg  refill request received. Patient is 83 years old, weight-88.4kg, Crea-0.85 on 01/04/24, Diagnosis-Afib, and last seen by Dr. Jeffrie on 06/19/24. Dose is appropriate based on dosing criteria. Will send in refill to requested pharmacy.

## 2024-10-01 ENCOUNTER — Other Ambulatory Visit

## 2024-10-01 DIAGNOSIS — I10 Essential (primary) hypertension: Secondary | ICD-10-CM | POA: Diagnosis not present

## 2024-10-01 DIAGNOSIS — I2510 Atherosclerotic heart disease of native coronary artery without angina pectoris: Secondary | ICD-10-CM | POA: Diagnosis not present

## 2024-10-01 DIAGNOSIS — Z7901 Long term (current) use of anticoagulants: Secondary | ICD-10-CM | POA: Diagnosis not present

## 2024-10-01 DIAGNOSIS — R7301 Impaired fasting glucose: Secondary | ICD-10-CM

## 2024-10-01 DIAGNOSIS — Z8719 Personal history of other diseases of the digestive system: Secondary | ICD-10-CM

## 2024-10-01 DIAGNOSIS — Z8739 Personal history of other diseases of the musculoskeletal system and connective tissue: Secondary | ICD-10-CM | POA: Diagnosis not present

## 2024-10-01 DIAGNOSIS — E785 Hyperlipidemia, unspecified: Secondary | ICD-10-CM | POA: Diagnosis not present

## 2024-10-01 DIAGNOSIS — Z79899 Other long term (current) drug therapy: Secondary | ICD-10-CM | POA: Diagnosis not present

## 2024-10-01 LAB — HEMOGLOBIN A1C: Hgb A1c MFr Bld: 5.9 % (ref 4.6–6.5)

## 2024-10-01 LAB — LIPID PANEL
Cholesterol: 90 mg/dL (ref 28–200)
HDL: 51.7 mg/dL (ref >39.00)
LDL Cholesterol: 24 mg/dL (ref 10–99)
NonHDL: 38.2
Total CHOL/HDL Ratio: 2
Triglycerides: 70 mg/dL (ref 10.0–149.0)
VLDL: 14 mg/dL (ref 0.0–40.0)

## 2024-10-01 LAB — URIC ACID: Uric Acid, Serum: 6.7 mg/dL (ref 4.0–7.8)

## 2024-10-01 LAB — CBC WITH DIFFERENTIAL/PLATELET
Basophils Absolute: 0.1 K/uL (ref 0.0–0.1)
Basophils Relative: 0.9 % (ref 0.0–3.0)
Eosinophils Absolute: 0.2 K/uL (ref 0.0–0.7)
Eosinophils Relative: 2.5 % (ref 0.0–5.0)
HCT: 47.9 % (ref 39.0–52.0)
Hemoglobin: 16 g/dL (ref 13.0–17.0)
Lymphocytes Relative: 26.6 % (ref 12.0–46.0)
Lymphs Abs: 1.9 K/uL (ref 0.7–4.0)
MCHC: 33.5 g/dL (ref 30.0–36.0)
MCV: 84.9 fl (ref 78.0–100.0)
Monocytes Absolute: 0.7 K/uL (ref 0.1–1.0)
Monocytes Relative: 9.6 % (ref 3.0–12.0)
Neutro Abs: 4.4 K/uL (ref 1.4–7.7)
Neutrophils Relative %: 60.4 % (ref 43.0–77.0)
Platelets: 209 K/uL (ref 150.0–400.0)
RBC: 5.64 Mil/uL (ref 4.22–5.81)
RDW: 13.7 % (ref 11.5–15.5)
WBC: 7.2 K/uL (ref 4.0–10.5)

## 2024-10-01 LAB — BASIC METABOLIC PANEL WITH GFR
BUN: 15 mg/dL (ref 6–23)
CO2: 28 meq/L (ref 19–32)
Calcium: 10.2 mg/dL (ref 8.4–10.5)
Chloride: 106 meq/L (ref 96–112)
Creatinine, Ser: 1.07 mg/dL (ref 0.40–1.50)
GFR: 63.82 mL/min (ref >60.00)
Glucose, Bld: 107 mg/dL — ABNORMAL HIGH (ref 70–99)
Potassium: 4.2 meq/L (ref 3.5–5.1)
Sodium: 142 meq/L (ref 135–145)

## 2024-10-01 LAB — TSH: TSH: 4.5 u[IU]/mL (ref 0.35–5.50)

## 2024-10-01 LAB — HEPATIC FUNCTION PANEL
ALT: 25 U/L (ref 3–53)
AST: 25 U/L (ref 5–37)
Albumin: 4.5 g/dL (ref 3.5–5.2)
Alkaline Phosphatase: 57 U/L (ref 39–117)
Bilirubin, Direct: 0.2 mg/dL (ref 0.1–0.3)
Total Bilirubin: 0.8 mg/dL (ref 0.2–1.2)
Total Protein: 7.2 g/dL (ref 6.0–8.3)

## 2024-10-02 ENCOUNTER — Ambulatory Visit: Admitting: Internal Medicine

## 2024-10-02 ENCOUNTER — Other Ambulatory Visit

## 2024-10-02 ENCOUNTER — Other Ambulatory Visit: Payer: Self-pay | Admitting: Internal Medicine

## 2024-10-02 ENCOUNTER — Encounter: Payer: Self-pay | Admitting: Internal Medicine

## 2024-10-02 VITALS — BP 150/62 | HR 58 | Temp 97.9°F | Ht 68.0 in | Wt 199.4 lb

## 2024-10-02 DIAGNOSIS — R7301 Impaired fasting glucose: Secondary | ICD-10-CM | POA: Diagnosis not present

## 2024-10-02 DIAGNOSIS — Z23 Encounter for immunization: Secondary | ICD-10-CM | POA: Diagnosis not present

## 2024-10-02 DIAGNOSIS — Z79899 Other long term (current) drug therapy: Secondary | ICD-10-CM

## 2024-10-02 DIAGNOSIS — I482 Chronic atrial fibrillation, unspecified: Secondary | ICD-10-CM | POA: Diagnosis not present

## 2024-10-02 DIAGNOSIS — Z7901 Long term (current) use of anticoagulants: Secondary | ICD-10-CM

## 2024-10-02 DIAGNOSIS — I2510 Atherosclerotic heart disease of native coronary artery without angina pectoris: Secondary | ICD-10-CM | POA: Diagnosis not present

## 2024-10-02 DIAGNOSIS — Z955 Presence of coronary angioplasty implant and graft: Secondary | ICD-10-CM

## 2024-10-02 DIAGNOSIS — Z0000 Encounter for general adult medical examination without abnormal findings: Secondary | ICD-10-CM | POA: Diagnosis not present

## 2024-10-02 DIAGNOSIS — I10 Essential (primary) hypertension: Secondary | ICD-10-CM | POA: Diagnosis not present
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
## 2024-10-02 NOTE — Patient Instructions (Addendum)
 Good to see you today . BP up some  not at goal   above  140  if up at home then  add  the amlodipine  2.5  per day .   Fu 6 months  A1c   Slow increase in activity may make you feel better   fall precautions .

## 2024-10-03 ENCOUNTER — Telehealth: Payer: Self-pay

## 2024-10-03 DIAGNOSIS — I10 Essential (primary) hypertension: Secondary | ICD-10-CM

## 2024-10-03 NOTE — Progress Notes (Signed)
" ° °  10/03/2024  Patient ID: Bryan Cabrera, male   DOB: 02/04/1941, 83 y.o.   MRN: [id number]  Received referral request from PCP regarding HTN for patient. Placing referral order. Patient agrees to appt on 10/07/24 at 2pm via telephone.  Jon VEAR Lindau, PharmD Clinical Pharmacist [phone]  "

## 2024-10-14 ENCOUNTER — Other Ambulatory Visit: Payer: Self-pay

## 2024-10-14 VITALS — BP 117/72 | HR 67

## 2024-10-14 DIAGNOSIS — I10 Essential (primary) hypertension: Secondary | ICD-10-CM

## 2024-10-14 NOTE — Progress Notes (Signed)
 "  10/14/2024 Name: Bryan Cabrera MRN: [id number] DOB: 12-10-1940  Chief Complaint  Patient presents with   Medication Management   Hypertension    Bryan Cabrera is a 83 y.o. year old male who presented for a telephone visit.   They were referred to the pharmacist by their PCP for assistance in managing hypertension.    Subjective:  Care Team: Primary Care Provider: Charlett Apolinar POUR, MD ; Next Scheduled Visit: 04/07/25  Medication Access/Adherence  Current Pharmacy:  [facility] - [place], [place] - [address] [place] [address] Phone: [phone] Fax: [phone]   Patient reports affordability concerns with their medications: No  Patient reports access/transportation concerns to their pharmacy: No  Patient reports adherence concerns with their medications:  No     Hypertension:  Current medications: Olmesartan  40mg  1 tablet daily, Isosorbide  Mono 30mg  daily Medications previously tried: Valsartan , hydrochlorothiazide , Diltiazem , Metoprolol   Patient has a validated, automated, upper arm home BP cuff Current blood pressure readings readings: 12:50p 117/72 HR 67  Full Log: Dec 26th 12:45pm 122/66 HR 67 1p 113/62 HR 63 1:15p 119/70 HR 69  DEC 27th 6:45pm 130/68 HR 69  DEC 29TH 10PM 138/83 HR 71 10:15PM  141/85 HR 73  DEC 30TH 12:40P 131/82 HR 67 12:50 117/72 HR 67  Patient denies hypotensive s/sx including dizziness, lightheadedness.  Patient denies hypertensive symptoms including headache, chest pain, shortness of breath   Objective:  Lab Results  Component Value Date   HGBA1C 5.9 10/01/2024    Lab Results  Component Value Date   CREATININE 1.07 10/01/2024   BUN 15 10/01/2024   NA 142 10/01/2024   K 4.2 10/01/2024   CL 106 10/01/2024   CO2 28 10/01/2024    Lab Results  Component Value Date   CHOL 90 10/01/2024   HDL 51.70 10/01/2024   LDLCALC 24 10/01/2024   LDLDIRECT 99.0 01/18/2021   TRIG 70.0  10/01/2024   CHOLHDL 2 10/01/2024    Medications Reviewed Today     Reviewed by Lionell Jon DEL, RPH (Pharmacist) on 10/14/24 at 1426  Med List Status: <None>   Medication Order Taking? Sig Documenting Provider Last Dose Status Informant  acetaminophen  (TYLENOL ) 325 MG tablet [id number]  Take 2 tablets (650 mg total) by mouth every 6 (six) hours as needed for mild pain (pain score 1-3), fever or headache.  Patient not taking: Reported on 10/02/2024   Pearlean Manus, MD  Active Self, Pharmacy Records  apixaban  (ELIQUIS ) 5 MG TABS tablet [id number] Yes Take 1 tablet (5 mg total) by mouth 2 (two) times daily. Jeffrie Oneil BROCKS, MD  Active   hydrocortisone  2.5 % cream [id number]  Apply topically.  Patient taking differently: Apply topically as needed.   [provider]  Active Self, Pharmacy Records  isosorbide  mononitrate (IMDUR ) 30 MG 24 hr tablet [id number] Yes TAKE ONE TABLET (30 MG TOTAL) BY MOUTH DAILY Panosh, Wanda K, MD  Active   latanoprost  (XALATAN ) 0.005 % ophthalmic solution [id number]  Place 1 drop into both eyes at bedtime. Koberlein, Junell C, MD  Active Self, Pharmacy Records  nitroGLYCERIN  (NITROSTAT ) 0.4 MG SL tablet [id number]  PLACE ONE TONGUE (0.4 MG TOTAL) UNDER THE TONGUE EVERY 5 MINUTES AS NEEDED FORCHEST PAIN. Dunn, Dayna N, [name]  Active Self, Pharmacy Records  olmesartan  (BENICAR ) 40 MG tablet [id number] Yes TAKE ONE TABLET (40MG  TOTAL) BY MOUTH DAILY Dunn, Dayna N, [name]  Active   rosuvastatin  (CRESTOR ) 20 MG tablet [id number] Yes  Take 1 tablet (20 mg total) by mouth daily. Jeffrie Oneil BROCKS, MD  Active Self, Pharmacy Records              Assessment/Plan:   Hypertension: - Currently controlled - Reviewed long term cardiovascular and renal outcomes of uncontrolled blood pressure - Reviewed appropriate blood pressure monitoring technique and reviewed goal blood pressure. Recommended to check home blood pressure and heart rate 2-3x/week and keep a log - Recommend  to continue current med therapy, continue to hold amlodipine . Reach out sooner than scheduled f/u if BP begins to go over >140/90    Follow Up Plan: 4 weeks  Jon VEAR Lindau, PharmD Clinical Pharmacist [phone]  "

## 2024-11-10 ENCOUNTER — Other Ambulatory Visit

## 2024-11-12 ENCOUNTER — Other Ambulatory Visit

## 2024-11-12 VITALS — BP 123/67

## 2024-11-12 DIAGNOSIS — I10 Essential (primary) hypertension: Secondary | ICD-10-CM

## 2024-11-12 DIAGNOSIS — Z79899 Other long term (current) drug therapy: Secondary | ICD-10-CM

## 2024-11-12 NOTE — Progress Notes (Signed)
 "  11/12/2024 Name: Bryan Cabrera MRN: [id number] DOB: [birth year]  Chief Complaint  Patient presents with   Medication Management   Hypertension    Bryan Cabrera is a 84 y.o. year old male who presented for a telephone visit.   They were referred to the pharmacist by their PCP for assistance in managing hypertension.    Subjective:  Care Team: Primary Care Provider: Charlett Apolinar POUR, MD ; Next Scheduled Visit: 04/07/25  Medication Access/Adherence  Current Pharmacy:  [facility] - [place], [place] - [address] [place] [address] Phone: [phone] Fax: [phone]   Patient reports affordability concerns with their medications: No  Patient reports access/transportation concerns to their pharmacy: No  Patient reports adherence concerns with their medications:  No     Hypertension:  Current medications: Olmesartan  40mg  1 tablet daily, Isosorbide  Mono 30mg  daily Medications previously tried: Valsartan , hydrochlorothiazide , Diltiazem , Metoprolol   Patient has a validated, automated, upper arm home BP cuff Current blood pressure readings readings: 1/28 at 2pm: 123/67 HR 71  Full Log: 1/7 2P 132/80 hr 67 1/12 10:45A 134/80 HR 61 1/17 11:50p 137/75 HR 76 1/18 5:15P 130/84 HR 66 1/23 4P at dr office 123/63 HR 61 1/27 7:30p 129/75 HR 73 1/28 2p 123/67 HR 71  Patient denies hypotensive s/sx including dizziness, lightheadedness.  Patient denies hypertensive symptoms including headache, chest pain, shortness of breath   Objective:  Lab Results  Component Value Date   HGBA1C 5.9 10/01/2024    Lab Results  Component Value Date   CREATININE 1.07 10/01/2024   BUN 15 10/01/2024   NA 142 10/01/2024   K 4.2 10/01/2024   CL 106 10/01/2024   CO2 28 10/01/2024    Lab Results  Component Value Date   CHOL 90 10/01/2024   HDL 51.70 10/01/2024   LDLCALC 24 10/01/2024   LDLDIRECT 99.0 01/18/2021   TRIG 70.0 10/01/2024   CHOLHDL 2  10/01/2024    Medications Reviewed Today     Reviewed by Lionell Jon DEL, RPH (Pharmacist) on 11/12/24 at 1506  Med List Status: <None>   Medication Order Taking? Sig Documenting Provider Last Dose Status Informant  acetaminophen  (TYLENOL ) 325 MG tablet [id number]  Take 2 tablets (650 mg total) by mouth every 6 (six) hours as needed for mild pain (pain score 1-3), fever or headache.  Patient not taking: Reported on 10/02/2024   Pearlean Manus, MD  Active Self, Pharmacy Records  apixaban  (ELIQUIS ) 5 MG TABS tablet [id number]  Take 1 tablet (5 mg total) by mouth 2 (two) times daily. Jeffrie Oneil BROCKS, MD  Active   hydrocortisone  2.5 % cream [id number]  Apply topically.  Patient taking differently: Apply topically as needed.   [provider]  Active Self, Pharmacy Records  isosorbide  mononitrate (IMDUR ) 30 MG 24 hr tablet [id number]  TAKE ONE TABLET (30 MG TOTAL) BY MOUTH DAILY Panosh, Wanda K, MD  Active   latanoprost  (XALATAN ) 0.005 % ophthalmic solution [id number]  Place 1 drop into both eyes at bedtime. Koberlein, Junell C, MD  Active Self, Pharmacy Records  nitroGLYCERIN  (NITROSTAT ) 0.4 MG SL tablet [id number]  PLACE ONE TONGUE (0.4 MG TOTAL) UNDER THE TONGUE EVERY 5 MINUTES AS NEEDED FORCHEST PAIN. Dunn, Dayna N, [name]  Active Self, Pharmacy Records  olmesartan  (BENICAR ) 40 MG tablet [id number]  TAKE ONE TABLET (40MG  TOTAL) BY MOUTH DAILY Dunn, Dayna N, [name]  Active   rosuvastatin  (CRESTOR ) 20 MG tablet [id number]  Take 1 tablet (20  mg total) by mouth daily. Jeffrie Oneil BROCKS, MD  Active Self, Pharmacy Records              Assessment/Plan:   Hypertension: - Currently controlled - Reviewed long term cardiovascular and renal outcomes of uncontrolled blood pressure - Reviewed appropriate blood pressure monitoring technique and reviewed goal blood pressure. Recommended to check home blood pressure and heart rate 2-3x/week and keep a log - Recommend to continue current med therapy,  continue to hold amlodipine . Reach out sooner than scheduled f/u if BP begins to go over >140/90    Follow Up Plan: as needed  Jon VEAR Lindau, PharmD Clinical Pharmacist [phone]  "

## 2025-04-07 ENCOUNTER — Ambulatory Visit: Admitting: Internal Medicine
